# Patient Record
Sex: Female | Born: 1975 | Race: Black or African American | Hispanic: No | Marital: Single | Smoking: Former smoker
Health system: Southern US, Community
[De-identification: ages and names within clinical notes are randomized; demographics above are authoritative.]

## PROBLEM LIST (undated history)

## (undated) DIAGNOSIS — R42 Dizziness and giddiness: Secondary | ICD-10-CM

## (undated) DIAGNOSIS — I10 Essential (primary) hypertension: Secondary | ICD-10-CM

## (undated) DIAGNOSIS — G43909 Migraine, unspecified, not intractable, without status migrainosus: Secondary | ICD-10-CM

## (undated) DIAGNOSIS — F32A Depression, unspecified: Secondary | ICD-10-CM

## (undated) DIAGNOSIS — F4310 Post-traumatic stress disorder, unspecified: Secondary | ICD-10-CM

## (undated) HISTORY — DX: Dizziness and giddiness: R42

## (undated) HISTORY — DX: Migraine, unspecified, not intractable, without status migrainosus: G43.909

## (undated) HISTORY — DX: Depression, unspecified: F32.A

## (undated) HISTORY — DX: Post-traumatic stress disorder, unspecified: F43.10

---

## 2000-02-01 ENCOUNTER — Encounter: Payer: Self-pay | Admitting: Emergency Medicine

## 2000-02-01 ENCOUNTER — Emergency Department (HOSPITAL_COMMUNITY): Admission: EM | Admit: 2000-02-01 | Discharge: 2000-02-01 | Payer: Self-pay | Admitting: Emergency Medicine

## 2000-02-13 ENCOUNTER — Emergency Department (HOSPITAL_COMMUNITY): Admission: EM | Admit: 2000-02-13 | Discharge: 2000-02-13 | Payer: Self-pay | Admitting: *Deleted

## 2000-11-21 ENCOUNTER — Emergency Department (HOSPITAL_COMMUNITY): Admission: EM | Admit: 2000-11-21 | Discharge: 2000-11-21 | Payer: Self-pay | Admitting: Emergency Medicine

## 2000-11-21 ENCOUNTER — Encounter: Payer: Self-pay | Admitting: Emergency Medicine

## 2001-02-13 ENCOUNTER — Encounter: Payer: Self-pay | Admitting: Family Medicine

## 2001-02-13 ENCOUNTER — Ambulatory Visit (HOSPITAL_COMMUNITY): Admission: RE | Admit: 2001-02-13 | Discharge: 2001-02-13 | Payer: Self-pay | Admitting: Family Medicine

## 2001-03-01 ENCOUNTER — Emergency Department (HOSPITAL_COMMUNITY): Admission: EM | Admit: 2001-03-01 | Discharge: 2001-03-01 | Payer: Self-pay | Admitting: Emergency Medicine

## 2001-07-05 ENCOUNTER — Emergency Department (HOSPITAL_COMMUNITY): Admission: EM | Admit: 2001-07-05 | Discharge: 2001-07-05 | Payer: Self-pay | Admitting: *Deleted

## 2002-05-29 ENCOUNTER — Emergency Department (HOSPITAL_COMMUNITY): Admission: EM | Admit: 2002-05-29 | Discharge: 2002-05-29 | Payer: Self-pay | Admitting: Emergency Medicine

## 2002-10-04 ENCOUNTER — Emergency Department (HOSPITAL_COMMUNITY): Admission: EM | Admit: 2002-10-04 | Discharge: 2002-10-04 | Payer: Self-pay | Admitting: Emergency Medicine

## 2003-07-25 ENCOUNTER — Inpatient Hospital Stay (HOSPITAL_COMMUNITY): Admission: AD | Admit: 2003-07-25 | Discharge: 2003-07-25 | Payer: Self-pay | Admitting: *Deleted

## 2003-09-08 ENCOUNTER — Emergency Department (HOSPITAL_COMMUNITY): Admission: EM | Admit: 2003-09-08 | Discharge: 2003-09-08 | Payer: Self-pay | Admitting: Emergency Medicine

## 2003-11-08 ENCOUNTER — Inpatient Hospital Stay (HOSPITAL_COMMUNITY): Admission: AD | Admit: 2003-11-08 | Discharge: 2003-11-08 | Payer: Self-pay | Admitting: Obstetrics

## 2003-12-08 ENCOUNTER — Inpatient Hospital Stay (HOSPITAL_COMMUNITY): Admission: AD | Admit: 2003-12-08 | Discharge: 2003-12-08 | Payer: Self-pay | Admitting: Obstetrics

## 2003-12-23 ENCOUNTER — Inpatient Hospital Stay (HOSPITAL_COMMUNITY): Admission: AD | Admit: 2003-12-23 | Discharge: 2003-12-23 | Payer: Self-pay | Admitting: Obstetrics

## 2004-03-06 ENCOUNTER — Inpatient Hospital Stay (HOSPITAL_COMMUNITY): Admission: AD | Admit: 2004-03-06 | Discharge: 2004-03-08 | Payer: Self-pay | Admitting: Obstetrics

## 2004-04-25 ENCOUNTER — Emergency Department (HOSPITAL_COMMUNITY): Admission: EM | Admit: 2004-04-25 | Discharge: 2004-04-25 | Payer: Self-pay | Admitting: Emergency Medicine

## 2004-10-05 ENCOUNTER — Ambulatory Visit: Payer: Self-pay | Admitting: Nurse Practitioner

## 2004-10-06 ENCOUNTER — Ambulatory Visit: Payer: Self-pay | Admitting: Nurse Practitioner

## 2004-10-12 ENCOUNTER — Ambulatory Visit: Payer: Self-pay | Admitting: Nurse Practitioner

## 2004-11-29 ENCOUNTER — Ambulatory Visit: Payer: Self-pay | Admitting: Nurse Practitioner

## 2004-12-29 ENCOUNTER — Ambulatory Visit: Payer: Self-pay | Admitting: Nurse Practitioner

## 2005-02-21 ENCOUNTER — Ambulatory Visit: Payer: Self-pay | Admitting: Nurse Practitioner

## 2005-02-24 ENCOUNTER — Ambulatory Visit: Payer: Self-pay | Admitting: Nurse Practitioner

## 2005-03-14 ENCOUNTER — Emergency Department (HOSPITAL_COMMUNITY): Admission: EM | Admit: 2005-03-14 | Discharge: 2005-03-14 | Payer: Self-pay | Admitting: Emergency Medicine

## 2005-03-18 ENCOUNTER — Ambulatory Visit: Payer: Self-pay | Admitting: Nurse Practitioner

## 2005-04-12 ENCOUNTER — Ambulatory Visit: Payer: Self-pay | Admitting: Nurse Practitioner

## 2005-04-15 ENCOUNTER — Ambulatory Visit: Payer: Self-pay | Admitting: Nurse Practitioner

## 2005-04-18 ENCOUNTER — Ambulatory Visit: Payer: Self-pay | Admitting: Nurse Practitioner

## 2005-04-19 ENCOUNTER — Ambulatory Visit: Payer: Self-pay | Admitting: Nurse Practitioner

## 2005-05-12 ENCOUNTER — Ambulatory Visit: Payer: Self-pay | Admitting: Nurse Practitioner

## 2005-06-14 ENCOUNTER — Ambulatory Visit: Payer: Self-pay | Admitting: Nurse Practitioner

## 2005-07-10 ENCOUNTER — Emergency Department (HOSPITAL_COMMUNITY): Admission: EM | Admit: 2005-07-10 | Discharge: 2005-07-10 | Payer: Self-pay | Admitting: Emergency Medicine

## 2005-08-15 ENCOUNTER — Emergency Department (HOSPITAL_COMMUNITY): Admission: EM | Admit: 2005-08-15 | Discharge: 2005-08-15 | Payer: Self-pay | Admitting: *Deleted

## 2005-09-27 ENCOUNTER — Ambulatory Visit: Payer: Self-pay | Admitting: Nurse Practitioner

## 2005-09-27 ENCOUNTER — Other Ambulatory Visit: Admission: RE | Admit: 2005-09-27 | Discharge: 2005-09-27 | Payer: Self-pay | Admitting: Family Medicine

## 2005-09-27 LAB — CONVERTED CEMR LAB: Pap Smear: NORMAL

## 2005-10-04 ENCOUNTER — Ambulatory Visit: Payer: Self-pay | Admitting: Nurse Practitioner

## 2005-10-18 ENCOUNTER — Ambulatory Visit: Payer: Self-pay | Admitting: Nurse Practitioner

## 2005-10-28 ENCOUNTER — Ambulatory Visit: Payer: Self-pay | Admitting: Nurse Practitioner

## 2005-10-31 ENCOUNTER — Ambulatory Visit: Payer: Self-pay | Admitting: Nurse Practitioner

## 2005-12-28 ENCOUNTER — Ambulatory Visit: Payer: Self-pay | Admitting: Nurse Practitioner

## 2006-01-11 ENCOUNTER — Ambulatory Visit: Payer: Self-pay | Admitting: Nurse Practitioner

## 2006-02-10 ENCOUNTER — Ambulatory Visit: Payer: Self-pay | Admitting: Nurse Practitioner

## 2006-03-07 ENCOUNTER — Ambulatory Visit: Payer: Self-pay | Admitting: Nurse Practitioner

## 2006-03-18 ENCOUNTER — Emergency Department (HOSPITAL_COMMUNITY): Admission: EM | Admit: 2006-03-18 | Discharge: 2006-03-18 | Payer: Self-pay | Admitting: Emergency Medicine

## 2006-03-20 ENCOUNTER — Ambulatory Visit: Payer: Self-pay | Admitting: Nurse Practitioner

## 2006-03-21 ENCOUNTER — Encounter: Payer: Self-pay | Admitting: Vascular Surgery

## 2006-03-21 ENCOUNTER — Ambulatory Visit (HOSPITAL_COMMUNITY): Admission: RE | Admit: 2006-03-21 | Discharge: 2006-03-21 | Payer: Self-pay | Admitting: Family Medicine

## 2006-03-23 ENCOUNTER — Ambulatory Visit (HOSPITAL_COMMUNITY): Admission: RE | Admit: 2006-03-23 | Discharge: 2006-03-23 | Payer: Self-pay | Admitting: Family Medicine

## 2006-03-24 ENCOUNTER — Ambulatory Visit: Payer: Self-pay | Admitting: Nurse Practitioner

## 2006-03-31 ENCOUNTER — Ambulatory Visit: Payer: Self-pay | Admitting: Nurse Practitioner

## 2006-04-14 ENCOUNTER — Ambulatory Visit: Payer: Self-pay | Admitting: Oncology

## 2006-06-13 ENCOUNTER — Ambulatory Visit: Payer: Self-pay | Admitting: Nurse Practitioner

## 2006-07-12 ENCOUNTER — Ambulatory Visit: Payer: Self-pay | Admitting: Nurse Practitioner

## 2006-08-21 ENCOUNTER — Ambulatory Visit: Payer: Self-pay | Admitting: Nurse Practitioner

## 2006-08-29 ENCOUNTER — Ambulatory Visit: Payer: Self-pay | Admitting: Internal Medicine

## 2006-08-31 ENCOUNTER — Ambulatory Visit: Payer: Self-pay | Admitting: Nurse Practitioner

## 2006-09-21 ENCOUNTER — Ambulatory Visit: Payer: Self-pay | Admitting: Internal Medicine

## 2006-10-31 ENCOUNTER — Ambulatory Visit: Payer: Self-pay | Admitting: Nurse Practitioner

## 2006-12-05 ENCOUNTER — Ambulatory Visit: Payer: Self-pay | Admitting: Nurse Practitioner

## 2007-01-26 ENCOUNTER — Encounter (INDEPENDENT_AMBULATORY_CARE_PROVIDER_SITE_OTHER): Payer: Self-pay | Admitting: Nurse Practitioner

## 2007-01-26 DIAGNOSIS — E669 Obesity, unspecified: Secondary | ICD-10-CM

## 2007-01-26 DIAGNOSIS — J301 Allergic rhinitis due to pollen: Secondary | ICD-10-CM

## 2007-01-26 DIAGNOSIS — N949 Unspecified condition associated with female genital organs and menstrual cycle: Secondary | ICD-10-CM

## 2007-03-30 ENCOUNTER — Ambulatory Visit: Payer: Self-pay | Admitting: Family Medicine

## 2007-09-12 ENCOUNTER — Ambulatory Visit: Payer: Self-pay | Admitting: Internal Medicine

## 2007-09-12 ENCOUNTER — Encounter (INDEPENDENT_AMBULATORY_CARE_PROVIDER_SITE_OTHER): Payer: Self-pay | Admitting: Nurse Practitioner

## 2007-09-12 LAB — CONVERTED CEMR LAB
Basophils Absolute: 0 10*3/uL (ref 0.0–0.1)
Cholesterol: 113 mg/dL (ref 0–200)
Eosinophils Absolute: 0.3 10*3/uL (ref 0.0–0.7)
HDL: 42 mg/dL (ref >39)
Helicobacter Pylori Antibody-IgG: 3.5 — ABNORMAL HIGH
LDL Cholesterol: 61 mg/dL (ref 0–99)
MCV: 71 fL — ABNORMAL LOW (ref 78.0–100.0)
Monocytes Absolute: 0.7 10*3/uL (ref 0.1–1.0)
Monocytes Relative: 7 % (ref 3–12)
Neutro Abs: 5.9 10*3/uL (ref 1.7–7.7)
Neutrophils Relative %: 61 % (ref 43–77)
Platelets: 257 10*3/uL (ref 150–400)
Preg, Serum: NEGATIVE
RDW: 15.1 % (ref 11.5–15.5)
Total CHOL/HDL Ratio: 2.7
Triglycerides: 48 mg/dL (ref <150)
VLDL: 10 mg/dL (ref 0–40)
WBC: 9.7 10*3/uL (ref 4.0–10.5)

## 2007-09-13 ENCOUNTER — Ambulatory Visit: Payer: Self-pay | Admitting: Family Medicine

## 2007-09-25 ENCOUNTER — Emergency Department (HOSPITAL_COMMUNITY): Admission: EM | Admit: 2007-09-25 | Discharge: 2007-09-25 | Payer: Self-pay | Admitting: Emergency Medicine

## 2007-11-15 ENCOUNTER — Encounter (INDEPENDENT_AMBULATORY_CARE_PROVIDER_SITE_OTHER): Payer: Self-pay | Admitting: Nurse Practitioner

## 2007-11-15 ENCOUNTER — Ambulatory Visit: Payer: Self-pay | Admitting: Internal Medicine

## 2007-11-15 LAB — CONVERTED CEMR LAB
BUN: 7 mg/dL (ref 6–23)
Creatinine, Ser: 0.66 mg/dL (ref 0.40–1.20)
Eosinophils Absolute: 0.1 10*3/uL (ref 0.0–0.7)
Eosinophils Relative: 1 % (ref 0–5)
Glucose, Bld: 64 mg/dL — ABNORMAL LOW (ref 70–99)
Lymphocytes Relative: 33 % (ref 12–46)
Monocytes Relative: 10 % (ref 3–12)
Potassium: 3.9 meq/L (ref 3.5–5.3)
Sodium: 138 meq/L (ref 135–145)
TSH: 1.002 microintl units/mL (ref 0.350–5.50)
Total Bilirubin: 0.3 mg/dL (ref 0.3–1.2)
Total Protein: 7.3 g/dL (ref 6.0–8.3)

## 2007-11-23 ENCOUNTER — Ambulatory Visit: Payer: Self-pay | Admitting: Internal Medicine

## 2007-11-26 ENCOUNTER — Ambulatory Visit: Payer: Self-pay | Admitting: Internal Medicine

## 2007-12-27 ENCOUNTER — Ambulatory Visit: Payer: Self-pay | Admitting: Internal Medicine

## 2008-02-13 ENCOUNTER — Ambulatory Visit: Payer: Self-pay | Admitting: Internal Medicine

## 2008-02-20 ENCOUNTER — Ambulatory Visit: Payer: Self-pay | Admitting: Internal Medicine

## 2008-02-20 ENCOUNTER — Ambulatory Visit (HOSPITAL_COMMUNITY): Admission: RE | Admit: 2008-02-20 | Discharge: 2008-02-20 | Payer: Self-pay | Admitting: Internal Medicine

## 2008-03-05 ENCOUNTER — Emergency Department (HOSPITAL_COMMUNITY): Admission: EM | Admit: 2008-03-05 | Discharge: 2008-03-05 | Payer: Self-pay | Admitting: Emergency Medicine

## 2008-03-19 ENCOUNTER — Ambulatory Visit: Payer: Self-pay | Admitting: Internal Medicine

## 2008-04-03 ENCOUNTER — Ambulatory Visit: Payer: Self-pay | Admitting: Internal Medicine

## 2008-04-08 ENCOUNTER — Ambulatory Visit (HOSPITAL_COMMUNITY): Admission: RE | Admit: 2008-04-08 | Discharge: 2008-04-08 | Payer: Self-pay | Admitting: Internal Medicine

## 2008-05-01 ENCOUNTER — Encounter: Admission: RE | Admit: 2008-05-01 | Discharge: 2008-05-28 | Payer: Self-pay | Admitting: Internal Medicine

## 2008-07-31 ENCOUNTER — Ambulatory Visit: Payer: Self-pay | Admitting: Family Medicine

## 2008-09-09 ENCOUNTER — Encounter (INDEPENDENT_AMBULATORY_CARE_PROVIDER_SITE_OTHER): Payer: Self-pay | Admitting: Adult Health

## 2008-09-09 ENCOUNTER — Ambulatory Visit: Payer: Self-pay | Admitting: Internal Medicine

## 2008-09-09 LAB — CONVERTED CEMR LAB
AST: 18 units/L (ref 0–37)
Albumin: 4.3 g/dL (ref 3.5–5.2)
Basophils Absolute: 0 10*3/uL (ref 0.0–0.1)
Basophils Relative: 0 % (ref 0–1)
CO2: 19 meq/L (ref 19–32)
Chloride: 104 meq/L (ref 96–112)
Cholesterol: 111 mg/dL (ref 0–200)
Creatinine, Ser: 0.78 mg/dL (ref 0.40–1.20)
Glucose, Bld: 84 mg/dL (ref 70–99)
HCT: 36.8 % (ref 36.0–46.0)
Hemoglobin: 12.6 g/dL (ref 12.0–15.0)
LDL Cholesterol: 50 mg/dL (ref 0–99)
Lymphocytes Relative: 17 % (ref 12–46)
Lymphs Abs: 2 10*3/uL (ref 0.7–4.0)
Monocytes Absolute: 1.2 10*3/uL — ABNORMAL HIGH (ref 0.1–1.0)
Monocytes Relative: 10 % (ref 3–12)
Neutrophils Relative %: 72 % (ref 43–77)
Potassium: 3.9 meq/L (ref 3.5–5.3)
RBC: 5.51 M/uL — ABNORMAL HIGH (ref 3.87–5.11)
Sodium: 139 meq/L (ref 135–145)
TSH: 0.9 microintl units/mL (ref 0.350–4.50)
Total Protein: 7.5 g/dL (ref 6.0–8.3)
Triglycerides: 116 mg/dL (ref <150)
WBC: 11.8 10*3/uL — ABNORMAL HIGH (ref 4.0–10.5)

## 2008-09-10 ENCOUNTER — Encounter (INDEPENDENT_AMBULATORY_CARE_PROVIDER_SITE_OTHER): Payer: Self-pay | Admitting: Adult Health

## 2008-12-10 ENCOUNTER — Encounter (INDEPENDENT_AMBULATORY_CARE_PROVIDER_SITE_OTHER): Payer: Self-pay | Admitting: Adult Health

## 2008-12-10 ENCOUNTER — Ambulatory Visit: Payer: Self-pay | Admitting: Internal Medicine

## 2009-01-28 ENCOUNTER — Encounter: Admission: RE | Admit: 2009-01-28 | Discharge: 2009-01-28 | Payer: Self-pay | Admitting: Internal Medicine

## 2009-02-26 ENCOUNTER — Inpatient Hospital Stay (HOSPITAL_COMMUNITY): Admission: EM | Admit: 2009-02-26 | Discharge: 2009-03-03 | Payer: Self-pay | Admitting: Emergency Medicine

## 2009-03-03 ENCOUNTER — Ambulatory Visit: Payer: Self-pay | Admitting: Dentistry

## 2009-03-05 ENCOUNTER — Ambulatory Visit (HOSPITAL_COMMUNITY): Admission: RE | Admit: 2009-03-05 | Discharge: 2009-03-05 | Payer: Self-pay | Admitting: Dentistry

## 2009-03-12 ENCOUNTER — Ambulatory Visit: Payer: Self-pay | Admitting: Internal Medicine

## 2009-03-24 ENCOUNTER — Encounter: Admission: AD | Admit: 2009-03-24 | Discharge: 2009-03-24 | Payer: Self-pay | Admitting: Dentistry

## 2009-04-01 ENCOUNTER — Ambulatory Visit: Payer: Self-pay | Admitting: Family Medicine

## 2009-04-01 ENCOUNTER — Encounter (INDEPENDENT_AMBULATORY_CARE_PROVIDER_SITE_OTHER): Payer: Self-pay | Admitting: Adult Health

## 2009-04-01 LAB — CONVERTED CEMR LAB
ALT: 16 units/L (ref 0–35)
Albumin: 4.2 g/dL (ref 3.5–5.2)
Alkaline Phosphatase: 49 units/L (ref 39–117)
BUN: 9 mg/dL (ref 6–23)
Basophils Absolute: 0 10*3/uL (ref 0.0–0.1)
Calcium: 9.3 mg/dL (ref 8.4–10.5)
Chloride: 106 meq/L (ref 96–112)
Creatinine, Ser: 0.83 mg/dL (ref 0.40–1.20)
Eosinophils Absolute: 0.2 10*3/uL (ref 0.0–0.7)
Glucose, Bld: 102 mg/dL — ABNORMAL HIGH (ref 70–99)
MCV: 70 fL — ABNORMAL LOW (ref 78.0–100.0)
Neutro Abs: 6.1 10*3/uL (ref 1.7–7.7)
Platelets: 270 10*3/uL (ref 150–400)
Potassium: 4 meq/L (ref 3.5–5.3)
RDW: 15.4 % (ref 11.5–15.5)
Sodium: 139 meq/L (ref 135–145)
Total Bilirubin: 0.2 mg/dL — ABNORMAL LOW (ref 0.3–1.2)

## 2009-05-06 ENCOUNTER — Ambulatory Visit: Payer: Self-pay | Admitting: Internal Medicine

## 2009-05-06 ENCOUNTER — Encounter (INDEPENDENT_AMBULATORY_CARE_PROVIDER_SITE_OTHER): Payer: Self-pay | Admitting: Adult Health

## 2009-05-06 LAB — CONVERTED CEMR LAB
Basophils Absolute: 0 10*3/uL (ref 0.0–0.1)
Basophils Relative: 0 % (ref 0–1)
HCT: 36.7 % (ref 36.0–46.0)
Hemoglobin: 12 g/dL (ref 12.0–15.0)
Lymphocytes Relative: 35 % (ref 12–46)
Monocytes Absolute: 0.8 10*3/uL (ref 0.1–1.0)
Monocytes Relative: 7 % (ref 3–12)
Neutro Abs: 6.2 10*3/uL (ref 1.7–7.7)
RDW: 14.3 % (ref 11.5–15.5)

## 2009-05-07 ENCOUNTER — Telehealth (INDEPENDENT_AMBULATORY_CARE_PROVIDER_SITE_OTHER): Payer: Self-pay | Admitting: *Deleted

## 2009-05-08 ENCOUNTER — Ambulatory Visit: Payer: Self-pay | Admitting: Family Medicine

## 2009-05-29 ENCOUNTER — Emergency Department (HOSPITAL_COMMUNITY): Admission: EM | Admit: 2009-05-29 | Discharge: 2009-05-29 | Payer: Self-pay | Admitting: Emergency Medicine

## 2009-06-11 ENCOUNTER — Ambulatory Visit: Payer: Self-pay | Admitting: Internal Medicine

## 2009-06-11 ENCOUNTER — Telehealth (INDEPENDENT_AMBULATORY_CARE_PROVIDER_SITE_OTHER): Payer: Self-pay | Admitting: *Deleted

## 2009-06-24 ENCOUNTER — Telehealth (INDEPENDENT_AMBULATORY_CARE_PROVIDER_SITE_OTHER): Payer: Self-pay | Admitting: *Deleted

## 2009-06-24 ENCOUNTER — Emergency Department (HOSPITAL_COMMUNITY): Admission: EM | Admit: 2009-06-24 | Discharge: 2009-06-24 | Payer: Self-pay | Admitting: Emergency Medicine

## 2009-07-22 ENCOUNTER — Telehealth (INDEPENDENT_AMBULATORY_CARE_PROVIDER_SITE_OTHER): Payer: Self-pay | Admitting: *Deleted

## 2009-07-23 ENCOUNTER — Ambulatory Visit: Payer: Self-pay | Admitting: Family Medicine

## 2009-08-05 ENCOUNTER — Ambulatory Visit (HOSPITAL_COMMUNITY): Admission: RE | Admit: 2009-08-05 | Discharge: 2009-08-05 | Payer: Self-pay | Admitting: Family Medicine

## 2009-09-29 ENCOUNTER — Ambulatory Visit: Payer: Self-pay | Admitting: Family Medicine

## 2009-10-20 ENCOUNTER — Ambulatory Visit (HOSPITAL_COMMUNITY): Admission: RE | Admit: 2009-10-20 | Discharge: 2009-10-20 | Payer: Self-pay | Admitting: Family Medicine

## 2009-12-10 ENCOUNTER — Ambulatory Visit: Payer: Self-pay | Admitting: Family Medicine

## 2010-01-21 ENCOUNTER — Ambulatory Visit: Payer: Self-pay | Admitting: Family Medicine

## 2010-05-18 ENCOUNTER — Emergency Department (HOSPITAL_COMMUNITY): Admission: EM | Admit: 2010-05-18 | Discharge: 2010-05-18 | Payer: Self-pay | Admitting: Emergency Medicine

## 2010-05-28 ENCOUNTER — Emergency Department (HOSPITAL_COMMUNITY): Admission: EM | Admit: 2010-05-28 | Discharge: 2010-05-28 | Payer: Self-pay | Admitting: Emergency Medicine

## 2010-06-18 ENCOUNTER — Emergency Department (HOSPITAL_COMMUNITY): Admission: EM | Admit: 2010-06-18 | Discharge: 2010-06-18 | Payer: Self-pay | Admitting: Emergency Medicine

## 2010-08-18 ENCOUNTER — Ambulatory Visit: Payer: Self-pay | Admitting: Family Medicine

## 2010-08-18 ENCOUNTER — Encounter: Payer: Self-pay | Admitting: Family Medicine

## 2010-08-18 DIAGNOSIS — F411 Generalized anxiety disorder: Secondary | ICD-10-CM

## 2010-08-18 DIAGNOSIS — F17200 Nicotine dependence, unspecified, uncomplicated: Secondary | ICD-10-CM

## 2010-08-18 DIAGNOSIS — G43709 Chronic migraine without aura, not intractable, without status migrainosus: Secondary | ICD-10-CM | POA: Insufficient documentation

## 2010-09-12 ENCOUNTER — Encounter: Payer: Self-pay | Admitting: Family Medicine

## 2010-09-12 ENCOUNTER — Encounter: Payer: Self-pay | Admitting: Internal Medicine

## 2010-09-23 NOTE — Assessment & Plan Note (Signed)
Summary: new pt/physical/bmc   Vital Signs:  Patient profile:   35 year old female Height:      67.5 inches Weight:      227 pounds BMI:     35.16 Temp:     98.1 degrees F oral Pulse rate:   84 / minute BP sitting:   122 / 81  (left arm) Cuff size:   regular  Vitals Entered By: Garen Grams LPN (August 18, 2010 9:02 AM) CC: New Patient Is Patient Diabetic? No Pain Assessment Patient in pain? no        Primary Provider:  Dessa Phi   CC:  New Patient.  History of Present Illness: New pt here to establish primary care. [facility] from [facility]. Would like to discuss depression, insomnia and migaines.  1. Depression- Pt has a diagnosis of bipolar disorder and anxiety. She has been on multiple medications in the past, mostly antidepressants. Most recently the pt was on Zoloft 100 mg PO q D and Tazodone 100 mg Po q D. She states that the Trazadone helped her sleep and had a slight effect on her mood. The felt that the Zoloft kept her awake. She has been off all psych meds for the past 6 mos. She has also triend Lexapro and Abilify in the past. She states that she cannot tolerate theses medications because they both lead her to full foggy in the head. She would like to restart Trazodone today mostly to help with her sleep. She denies suicidal/homicidal ideation. Enjoys many things, including spending time with her son, watching TV, taking walks, visiting [employer] and going out to eat.    2. Migraine's- pt has a history of frequent HA, tension, cluster. She states that over the past 2 mos she has had worsening migraine HAs. She reports a recent hospitalization for migraine and HTN. She describes HA that occur 2x/week. Throbbing in her neck, jaw, ears and head. Associated with photophobia. She denies blurred vision, loss of vision, N/V. She states that each HA generally last about 10 hrs. She reports taking Aleve and sleeping to relieve the HAs.   3. Tobacco use- Pt has  smoked on average 1 PPD since the age of 19. Has tried nicotine patch in the past. Could not tolerate the patch --> rash. Knows that she should quit. Is interested in trying other nicotine supplements. Her boyfriend also smokes. They both smoke 1 PPD. She has had to use an inhaler in the past (unknow medication) for SOB. Denies SOB, chronic cough.   Preventive Screening-Counseling & Management  Alcohol-Tobacco     Smoking Status: current     Smoking Cessation Counseling: yes     Packs/Day: 1.0  Allergies: No Known Drug Allergies  Past History:  Past Medical History: Anxiety Depression G2P1001 hx of molar pregnancy s/p D&C No history of abnormal pap smears. last pap in April 2010.    Prevention & Chronic Care Immunizations   Influenza vaccine: Not documented    Tetanus booster: 10/18/2005: Historical    Pneumococcal vaccine: Not documented  Other Screening   Pap smear: NEGATIVE FOR INTRAEPITHELIAL LESIONS OR MALIGNANCY.  (12/10/2008)   Smoking status: current  (08/18/2010)   Smoking cessation counseling: yes  (08/18/2010)   Target quit date: 08/18/2010  (08/18/2010)  Family History: Mother-Diabetes, bipolar disorder, schizophrenia. Living age 71. Father-living age 72. Maternal Aunt- Bipolar disorder, schizophrenia. Grandmother- diabetes Great GM- breast cancer at age 23.   Social History: Unemployed Lives with 53 yo son and  long-term boyfriend. Sexually active with BF x 11 years, condoms for contraception, regularly. Not interested in changing her birth control.  Smokes 1 PPD of newport cigarettes x 20 yrs.  Denies ETOH and IDU.  Exercise -2 x week and plays active playstation with son.  Smoking Status:  current Packs/Day:  1.0  Review of Systems       As per  HPI  Physical Exam  General:  Vitals reviewed- nml BP.  Head:  Normocephalic and atraumatic without obvious abnormalities. No apparent alopecia or balding. No frontal or paranasal sinus tenderness.    Eyes:  No corneal or conjunctival inflammation noted. EOMI. Perrla.  Mouth:  no lesions and fair dentition.   Neck:  No deformities, masses, or tenderness noted. Lungs:  Normal respiratory effort, chest expands symmetrically. Lungs are clear to auscultation, no crackles or wheezes. Heart:  Normal rate and regular rhythm. S1 and S2 normal without gallop, murmur, click, rub or other extra sounds.   Impression & Recommendations:  Problem # 1:  DEPRESSION (ICD-311) Will restart pt's trazodone to help stabilize her mood and to help with her sleep. Her updated medication list for this problem includes:    Trazodone Hcl 100 Mg Tabs (Trazodone hcl) ..... 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 This will also help stabilize mood. Pt may continue Aleve for breakthrough migraine.  Problem # 3:  TOBACCO ABUSE (ICD-305.1) Counselled pt on smoking prevention. Pt will try gum, buying different brand of cigarettes and ask her boyfriend to quit with her.  Her updated medication list for this problem includes:    Nicorette 2 Mg Gum (Nicotine polacrilex) ..... Use as directed on package.  Complete Medication List: 1)  Zyrtec 10 Mg Tabs (Cetirizine hcl) .Marland Kitchen.. 1 by mouth once daily 2)  Veramyst 27.5 Mcg/spray Susp (Fluticasone furoate) .... 2 sprays each nostril once daily 3)  Trazodone Hcl 100 Mg Tabs (Trazodone hcl) .... One tab every night 4)  Nicorette 2 Mg Gum (Nicotine polacrilex) .... Use as directed on package. 5)  Topiramate 25 Mg Cpsp (Topiramate) .... Take one tab daily. add one tab every week until taking up to 100 mg twice daily.  Patient Instructions: 1)  Ms. Reddish, 2)  Thank you for coming in today. 3)  It was a pleasure meeting you. 4)  For your insomnia/depression- I will restart your Trazadone/ 5)  For migraine prevention/treatment- Start taking Toprimax at 25 mg /day add 25 mg each  week. You can take up to 100 mg twice daily as tolerated. Continue aleve as needed.  6)  For smoking- try the gum and the tricks you know stop/ cut back. 7)  -Schedule a f/u 1-2 mos. for migraine check-up and pap smear.  8)  -Dr. Armen Pickup Prescriptions: TOPIRAMATE 25 MG CPSP (TOPIRAMATE) Take one tab daily. Add one tab every week until taking up to 100 mg twice daily.  #90 x 1   Entered and Authorized by:   Dessa Phi MD   Signed by:   Dessa Phi MD on 08/18/2010   Method used:   Electronically to        [facility] [phone]* (retail)       [address]       [place], [place]  [address]       Ph: [phone] or [phone]       Fax: [phone]  RxID:   [id number] NICORETTE 2 MG GUM (NICOTINE POLACRILEX) Use as directed on package.  #1 pack x 3   Entered and Authorized by:   Dessa Phi MD   Signed by:   Dessa Phi MD on 08/18/2010   Method used:   [facility] to        [facility] [phone]* (retail)       [address] [place]  [address]       Ph: [phone] or [phone]       Fax: [phone]   RxID:   [id number] EXCEDRIN MIGRAINE 250-250-65 MG TABS (ASPIRIN-ACETAMINOPHEN-CAFFEINE) Take two tabs every 4 hrs as needed for migraines  #60 x 0   Entered and Authorized by:   Dessa Phi MD   Signed by:   Dessa Phi MD on 08/18/2010   Method used:   [facility] to        [facility] [phone]* (retail)       [address], [place]  [address]       Ph: [phone] or [phone]       Fax: [phone]   RxID:   [id number] TRAZODONE HCL 100 MG TABS (TRAZODONE HCL) one tab every night  #60 x 1   Entered and Authorized by:   Dessa Phi MD   Signed by:   Dessa Phi MD on 08/18/2010   Method used:   [facility]ronically to        [facility] [phone]* (retail)       [address], [place]  [address]       Ph: [phone] or [phone]       Fax: [phone]   RxID:    [id number]    Orders Added: 1)  [facility]- [facility] 3 [phone]

## 2010-09-23 NOTE — Miscellaneous (Signed)
  Clinical Lists Changes  Allergies: Added new allergy or adverse reaction of LEXAPRO Added new allergy or adverse reaction of ABILIFY (ARIPIPRAZOLE) Observations: Added new observation of NKA: F (08/18/2010 13:52)

## 2010-09-24 ENCOUNTER — Encounter: Payer: Self-pay | Admitting: Family Medicine

## 2010-09-24 ENCOUNTER — Ambulatory Visit (INDEPENDENT_AMBULATORY_CARE_PROVIDER_SITE_OTHER): Payer: Medicaid Other | Admitting: Family Medicine

## 2010-09-24 ENCOUNTER — Telehealth: Payer: Self-pay | Admitting: Family Medicine

## 2010-09-24 DIAGNOSIS — IMO0002 Reserved for concepts with insufficient information to code with codable children: Secondary | ICD-10-CM

## 2010-09-29 NOTE — Assessment & Plan Note (Signed)
Summary: boil under arm,df   Vital Signs:  Patient profile:   35 year old female Weight:      229 pounds Temp:     98.6 degrees F oral Pulse rate:   72 / minute BP sitting:   130 / 80  (right arm)  Vitals Entered By: Arlyss Repress CMA, (September 24, 2010 3:22 PM) CC: boil under right arm. draining. Is Patient Diabetic? No Pain Assessment Patient in pain? no        Primary Care Provider:  Dessa Phi   CC:  boil under right arm. draining.Marland Kitchen  History of Present Illness: Boil in right axilla for 1.5 weeks. Draining a small amount. Tender. No fevers no chills. This is a recurrent problem in this location for the patient. She would like it drained if possible. Feels well otherwise. No allergies to numbing medicine.   Habits & Providers  Alcohol-Tobacco-Diet     Tobacco Status: current     Tobacco Counseling: to quit use of tobacco products     Cigarette Packs/Day: 1.0  Current Problems (verified): 1)  Abscess, Axilla, Right  (ICD-682.3) 2)  Tobacco Abuse  (ICD-305.1) 3)  Chronic Migraine w/o Aura w/o Intractable w/o Sm  (ICD-346.70) 4)  Depression  (ICD-311) 5)  Anxiety  (ICD-300.00) 6)  Dysfunctional Uterine Bleeding  (ICD-626.8) 7)  Allergic Rhinitis, Seasonal  (ICD-477.0) 8)  Obesity Nos  (ICD-278.00)  Current Medications (verified): 1)  Zyrtec 10 Mg Tabs (Cetirizine Hcl) .Marland Kitchen.. 1 By Mouth Once Daily 2)  Veramyst 27.5 Mcg/spray Susp (Fluticasone Furoate) .... 2 Sprays Each Nostril Once Daily 3)  Trazodone Hcl 100 Mg Tabs (Trazodone Hcl) .... One Tab Every Night 4)  Nicorette 2 Mg Gum (Nicotine Polacrilex) .... Use As Directed On Package. 5)  Topiramate 25 Mg Cpsp (Topiramate) .... Take One Tab Daily. Add One Tab Every Week Until Taking Up To 100 Mg Twice Daily. 6)  Doxycycline Hyclate 100 Mg Caps (Doxycycline Hyclate) .Marland Kitchen.. 1 By Mouth Two Times A Day X 7 Days  Allergies (verified): 1)  ! Lexapro 2)  ! Abilify (Aripiprazole)  Past History:  Past Medical  History: Last updated: 08/18/2010 Anxiety Depression G2P1001 hx of molar pregnancy s/p D&C No history of abnormal pap smears. last pap in April 2010.   Family History: Last updated: 08/18/2010 Mother-Diabetes, bipolar disorder, schizophrenia. Living age 95. Father-living age 67. Maternal Aunt- Bipolar disorder, schizophrenia. Grandmother- diabetes Great GM- breast cancer at age 29.   Social History: Last updated: 08/18/2010 Unemployed Lives with 24 yo son and long-term boyfriend. Sexually active with BF x 11 years, condoms for contraception, regularly. Not interested in changing her birth control.  Smokes 1 PPD of newport cigarettes x 20 yrs.  Denies ETOH and IDU.  Exercise -2 x week and plays active playstation with son.   Review of Systems  The patient denies anorexia, fever, and weight loss.    Physical Exam  General:  Vitals reviewed- nml BP.  Skin:  Right axilla: Small opening in axilla able to express a small amount of yellow fluid. No obvious surrounding erythemia.  On palpation a 2cm x1cm pocket of fluctant material is noted superficially. Mildly TTP.  Additional Exam:  I&D abscess: Concent obtained. Area cleaned. 4ml of 1% lidocaine with epinepherine was injected superficially over abscess.  A small triangbular incision was made a a small amount of skin was removed. About 3ml of necrotic foul smelling material was expressed. Loculations were broken up with hemastats and a further small amount  of material was expressed. Then about 20cm of packing material was placed into the abscess and a dressing was applied.    Impression & Recommendations:  Problem # 1:  ABSCESS, AXILLA, RIGHT (ICD-682.3) Assessment New  I&D as above. The drainage site was too small to allow complete drianage. This abscess could perhaps be an infected sebacious cyst.  Acute infection was drained today.   But the sebacious cyst may have to be removed when the infection is calmed down.  Also  will treat for doxy x1 week to further reduce the inflimation and any lingering cellulitis.  F/u in 2 weeks. Red flags reviewed.   Her updated medication list for this problem includes:    Doxycycline Hyclate 100 Mg Caps (Doxycycline hyclate) .Marland Kitchen... 1 by mouth two times a day x 7 days  Orders: [facility]- Est Level  3 ([address]) I&D Abcess, simple- [facility] (10060)  Complete Medication List: 1)  Zyrtec 10 Mg Tabs (Cetirizine hcl) .Marland Kitchen.. 1 by mouth once daily 2)  Veramyst 27.5 Mcg/spray Susp (Fluticasone furoate) .... 2 sprays each nostril once daily 3)  Trazodone Hcl 100 Mg Tabs (Trazodone hcl) .... One tab every night 4)  Nicorette 2 Mg Gum (Nicotine polacrilex) .... Use as directed on package. 5)  Topiramate 25 Mg Cpsp (Topiramate) .... Take one tab daily. add one tab every week until taking up to 100 mg twice daily. 6)  Doxycycline Hyclate 100 Mg Caps (Doxycycline hyclate) .Marland Kitchen.. 1 by mouth two times a day x 7 days  Patient Instructions: 1)  Thank you for seeing me today. 2)  Keep that incision dry if you can.  3)  Let it drain.  4)  Remove about 5-10cm of packing a day starting tomorrow.  5)  Take the doxycycline for 1 week.  6)  Come back for fevers, chills or if you get worse.  7)  Come on back in about 2 weeks for a recheck.  Prescriptions: DOXYCYCLINE HYCLATE 100 MG CAPS (DOXYCYCLINE HYCLATE) 1 by mouth two times a day x 7 days  #14 x 0   Entered and Authorized by:   Clementeen Graham MD   Signed by:   Clementeen Graham MD on 09/24/2010   Method used:   Print then Give to Patient   RxID:   [id number]    Orders Added: 1)  [facility]- Est Level  3 [[address]657] 2)  I&D Abcess, simple- [facility] [10060]

## 2010-10-07 ENCOUNTER — Telehealth: Payer: Self-pay | Admitting: Family Medicine

## 2010-10-07 NOTE — Progress Notes (Signed)
Summary: triage  Phone Note Call from Patient Call back at [phone]   Caller: Patient Summary of Call: has boil under arm and wants to know what to do. Initial call taken by: De Nurse,  September 24, 2010 9:34 AM  Follow-up for Phone Call        currently draining. located in r axilla. states it is big. wants to be seen today. hx of this in R axilla. asked her to call front desk & make an appt for today. she agreed Follow-up by: Golden Circle RN,  September 24, 2010 9:42 AM

## 2010-10-07 NOTE — Telephone Encounter (Signed)
Pain in neck, hurts to turn her neck.  Has appt to follow up tomorrow.  Took two aleve.  Would like to let her doctor know that she wants to talk about her neck at her appt.

## 2010-10-07 NOTE — Miscellaneous (Signed)
Summary: Procedures Consent  Procedures Consent   Imported By: De Nurse 09/28/2010 10:56:41  _____________________________________________________________________  External Attachment:    Type:   Image     Comment:   External Document

## 2010-10-08 ENCOUNTER — Ambulatory Visit (INDEPENDENT_AMBULATORY_CARE_PROVIDER_SITE_OTHER): Payer: Medicaid Other | Admitting: Family Medicine

## 2010-10-08 ENCOUNTER — Encounter: Payer: Self-pay | Admitting: Family Medicine

## 2010-10-08 DIAGNOSIS — IMO0002 Reserved for concepts with insufficient information to code with codable children: Secondary | ICD-10-CM

## 2010-10-08 DIAGNOSIS — M542 Cervicalgia: Secondary | ICD-10-CM | POA: Insufficient documentation

## 2010-10-08 DIAGNOSIS — R030 Elevated blood-pressure reading, without diagnosis of hypertension: Secondary | ICD-10-CM | POA: Insufficient documentation

## 2010-10-08 NOTE — Assessment & Plan Note (Signed)
Pt in prehypertensive range. Has been in HTN range in the past.  Asked pt to do home BP recordings and bring log into the PCP in 1 week when she will get her Pap smear.  Will follow up then.

## 2010-10-08 NOTE — Assessment & Plan Note (Addendum)
New. No red flags today.  Think MSK pain, however perhaps some radiculopathy. Plan: NSAIDS and ROM as pt is already doing. Gave handout and warned about long term high dose NSAIDS. Will follow up.

## 2010-10-08 NOTE — Patient Instructions (Addendum)
Thanks for coming in today.  I think you have a pulled muscle in your neck.  You are doing the right thing with aleve. You can take 2 twice a day for a week or so. Watch out for high blood pressure and stomach ulcers.  You can take tylenol with the aleve if you need to.  Try to stay active.  Get your sister to check your blood pressure a few times a month. Write it down,and bring it in.

## 2010-10-08 NOTE — Assessment & Plan Note (Signed)
Improved: Closed from top which is less desirable.  However no evidence of recurrent infection or fluid collection.  Will follow. May need removal of sebaceous cyst wall once inflammation completely resolved.

## 2010-10-08 NOTE — Progress Notes (Signed)
Boil right axillia: [name] did well following I&D of the boil. She removed all the packing in a few days and the boil closed up. However she does not experience any drainage or pain or swelling. She did not complete the ABX course. She feels well.   Neck Pain: [name] noted pain in her left lateral neck, shoulder and arm starting yesterday. She was using her laptop in bed in an awkward position. She denies any weakness or numbness in her hands. She notes that the pain goes to her elbow. Pain worse with ROM of her head. She took 2 aleve which helps the pain a lot.   HTN: BP intermittently elevated. Was on medication in the past. Now diet controlled. Has a sister who has a BP cuff and knows how to check BPs.   ROS: As above. No fever or chills.   Exam: VS noted.  Gen: Well NAD HEENT: MMM Neck: No pain on midline. ROM normal.   Mild TTP left Paraspinous and sternocleidomastoid muscles.   Spurling test mildly positive.  Lungs: CTABL Heart: RRR no MRG Axilla: Right: Non tender, no swelling or discharge. Small healing incision noted. Closed.  Exts: Non edematous BL LE Neuro:  Hands Normal sensation and strength BL. Arms normal strength and ROM. Gait normal.

## 2010-11-12 ENCOUNTER — Encounter: Payer: Self-pay | Admitting: Family Medicine

## 2010-11-12 ENCOUNTER — Ambulatory Visit (INDEPENDENT_AMBULATORY_CARE_PROVIDER_SITE_OTHER): Payer: Medicaid Other | Admitting: Family Medicine

## 2010-11-12 DIAGNOSIS — J301 Allergic rhinitis due to pollen: Secondary | ICD-10-CM

## 2010-11-12 DIAGNOSIS — F17200 Nicotine dependence, unspecified, uncomplicated: Secondary | ICD-10-CM

## 2010-11-12 DIAGNOSIS — G43709 Chronic migraine without aura, not intractable, without status migrainosus: Secondary | ICD-10-CM

## 2010-11-12 MED ORDER — NICOTINE 21 MG/24HR TD PT24: 1.0000 | MEDICATED_PATCH | TRANSDERMAL | Status: DC

## 2010-11-12 MED ORDER — FLUTICASONE PROPIONATE 50 MCG/ACT NA SUSP: 1.0000 | Freq: Two times a day (BID) | NASAL | Status: DC

## 2010-11-12 MED ORDER — TOPIRAMATE 25 MG PO TABS: 25.0000 mg | ORAL_TABLET | Freq: Two times a day (BID) | ORAL | Status: DC

## 2010-11-12 MED ORDER — CETIRIZINE HCL 10 MG PO TABS: 10.0000 mg | ORAL_TABLET | Freq: Every day | ORAL | Status: DC

## 2010-11-12 NOTE — Patient Instructions (Signed)
Lounette,  Thank you for coming in to see me today.  For your allergies: try to the Zyrtec and Flonase. Also gargle with warm salt water and on a regular basis. For your smoking: remember the motivators (son's health/setting a good example for him, your health, your wallet saving $$$). Use the patch as a nicotine supplement.  For your ears, I did not see anything alarming on your exam. When you come back for your pap smear remind me to check them again. If they start to really bother you I will try some drops and ear washes, if that dose not help I agree that an ENT referral would be warranted.   Please watch your salt and fatty foods intake and increase exercise, I would prefer to stay away from BP medicine if possible.   For your migraines, continue the current medicine. The toprimax is associated with cleft lips in babies, so since you are sexually active I would recommend contraception (condoms, copper IUD, etc.)  just in case.

## 2010-11-24 ENCOUNTER — Encounter: Payer: Self-pay | Admitting: Family Medicine

## 2010-11-24 NOTE — Progress Notes (Signed)
  Subjective:    Patient ID: [name], female    DOB: October 16, 1975, 35 y.o.   MRN: [id number]  HPI  Here for f/u visit and to discuss the following.  1. Smoking- pt would like to quit. Now smoking 1.5ppd with her long-term boyfriend. Smokes Newports. he in interested in trying nicotine patch.  2. Allergies: pt complaining of ear draining, runny nose, sore throat, cough and congestion each spring. Ear draining associated with pruritus. Pt has outdoor and indoor allergies.  3. Migraines: well controlled. Pt taking topamax and aleve.    Review of Systems  Constitutional: Negative.   HENT: Positive for congestion, sore throat, rhinorrhea, postnasal drip, sinus pressure and ear discharge. Negative for hearing loss, ear pain, nosebleeds, facial swelling, sneezing, drooling, mouth sores, trouble swallowing, neck pain, neck stiffness, dental problem, voice change and tinnitus.   Eyes: Positive for itching.  Respiratory: Negative.   Cardiovascular: Negative.   Gastrointestinal: Negative.   Genitourinary: Negative.   Musculoskeletal: Negative.        Objective:   Physical Exam  Constitutional: She appears well-developed and well-nourished.  HENT:  Head: Normocephalic and atraumatic.  Right Ear: External ear normal.  Left Ear: External ear normal.  Nose: Nose normal.  Mouth/Throat: Oropharynx is clear and moist.  Eyes: Conjunctivae and EOM are normal. Pupils are equal, round, and reactive to light.  Neck: Normal range of motion. Neck supple.  Cardiovascular: Normal rate, regular rhythm, normal heart sounds and intact distal pulses.   Pulmonary/Chest: Effort normal and breath sounds normal.  Neurological: No cranial nerve deficit.          Assessment & Plan:

## 2010-11-25 LAB — POCT PREGNANCY, URINE: Preg Test, Ur: NEGATIVE

## 2010-11-25 LAB — URINALYSIS, ROUTINE W REFLEX MICROSCOPIC
Bilirubin Urine: NEGATIVE
Glucose, UA: NEGATIVE mg/dL
Hgb urine dipstick: NEGATIVE
Ketones, ur: NEGATIVE mg/dL
Nitrite: NEGATIVE
Protein, ur: NEGATIVE mg/dL
Specific Gravity, Urine: 1.016 (ref 1.005–1.030)
Urobilinogen, UA: 0.2 mg/dL (ref 0.0–1.0)
pH: 7 (ref 5.0–8.0)

## 2010-11-26 ENCOUNTER — Telehealth: Payer: Self-pay | Admitting: Family Medicine

## 2010-11-26 NOTE — Telephone Encounter (Signed)
Noted that right earlobe become swollen and became itchy about 2 days. Now right arm has a few patches of itchy skin as well. No fevers or chills. Feels ok. Has not tired anything yet. No new medications.  Recommended hydrocortisone cream.

## 2010-11-28 ENCOUNTER — Encounter: Payer: Self-pay | Admitting: Family Medicine

## 2010-11-28 LAB — BASIC METABOLIC PANEL
BUN: 2 mg/dL — ABNORMAL LOW (ref 6–23)
BUN: 6 mg/dL (ref 6–23)
BUN: 7 mg/dL (ref 6–23)
CO2: 24 mEq/L (ref 19–32)
CO2: 26 mEq/L (ref 19–32)
CO2: 28 mEq/L (ref 19–32)
Calcium: 8.7 mg/dL (ref 8.4–10.5)
Calcium: 9.3 mg/dL (ref 8.4–10.5)
Chloride: 104 mEq/L (ref 96–112)
Chloride: 105 mEq/L (ref 96–112)
Chloride: 109 mEq/L (ref 96–112)
Creatinine, Ser: 0.67 mg/dL (ref 0.4–1.2)
GFR calc Af Amer: >60 mL/min (ref >60)
GFR calc Af Amer: >60 mL/min (ref >60)
GFR calc non Af Amer: >60 mL/min (ref >60)
GFR calc non Af Amer: >60 mL/min (ref >60)
GFR calc non Af Amer: >60 mL/min (ref >60)
Glucose, Bld: 105 mg/dL — ABNORMAL HIGH (ref 70–99)
Glucose, Bld: 119 mg/dL — ABNORMAL HIGH (ref 70–99)
Glucose, Bld: 97 mg/dL (ref 70–99)
Potassium: 3.6 mEq/L (ref 3.5–5.1)
Potassium: 3.7 mEq/L (ref 3.5–5.1)
Sodium: 135 mEq/L (ref 135–145)
Sodium: 138 mEq/L (ref 135–145)
Sodium: 142 mEq/L (ref 135–145)

## 2010-11-28 LAB — CBC
HCT: 34.8 % — ABNORMAL LOW (ref 36.0–46.0)
HCT: 35 % — ABNORMAL LOW (ref 36.0–46.0)
HCT: 39.6 % (ref 36.0–46.0)
Hemoglobin: 10.7 g/dL — ABNORMAL LOW (ref 12.0–15.0)
Hemoglobin: 12 g/dL (ref 12.0–15.0)
MCHC: 32 g/dL (ref 30.0–36.0)
MCHC: 32.7 g/dL (ref 30.0–36.0)
MCHC: 33 g/dL (ref 30.0–36.0)
MCV: 76.7 fL — ABNORMAL LOW (ref 78.0–100.0)
MCV: 77.8 fL — ABNORMAL LOW (ref 78.0–100.0)
MCV: 83.2 fL (ref 78.0–100.0)
MCV: 84.1 fL (ref 78.0–100.0)
Platelets: 258 10*3/uL (ref 150–400)
Platelets: 264 10*3/uL (ref 150–400)
RBC: 4.19 MIL/uL (ref 3.87–5.11)
RBC: 5.16 MIL/uL — ABNORMAL HIGH (ref 3.87–5.11)
RDW: 14.3 % (ref 11.5–15.5)
RDW: 14.5 % (ref 11.5–15.5)
RDW: 14.5 % (ref 11.5–15.5)
WBC: 14.7 10*3/uL — ABNORMAL HIGH (ref 4.0–10.5)
WBC: 15.8 10*3/uL — ABNORMAL HIGH (ref 4.0–10.5)
WBC: 27.6 10*3/uL — ABNORMAL HIGH (ref 4.0–10.5)

## 2010-11-28 LAB — GLUCOSE, CAPILLARY
Glucose-Capillary: 100 mg/dL — ABNORMAL HIGH (ref 70–99)
Glucose-Capillary: 108 mg/dL — ABNORMAL HIGH (ref 70–99)
Glucose-Capillary: 88 mg/dL (ref 70–99)
Glucose-Capillary: 89 mg/dL (ref 70–99)
Glucose-Capillary: 93 mg/dL (ref 70–99)
Glucose-Capillary: 99 mg/dL (ref 70–99)

## 2010-11-28 LAB — DIFFERENTIAL
Basophils Absolute: 0 10*3/uL (ref 0.0–0.1)
Basophils Absolute: 0 10*3/uL (ref 0.0–0.1)
Basophils Relative: 0 % (ref 0–1)
Basophils Relative: 0 % (ref 0–1)
Eosinophils Absolute: 0 10*3/uL (ref 0.0–0.7)
Eosinophils Relative: 0 % (ref 0–5)
Lymphs Abs: 3.6 10*3/uL (ref 0.7–4.0)
Monocytes Absolute: 1.7 10*3/uL — ABNORMAL HIGH (ref 0.1–1.0)
Neutro Abs: 13.4 10*3/uL — ABNORMAL HIGH (ref 1.7–7.7)
Neutrophils Relative %: 75 % (ref 43–77)
Neutrophils Relative %: 76 % (ref 43–77)

## 2010-11-28 LAB — COMPREHENSIVE METABOLIC PANEL
ALT: 22 U/L (ref 0–35)
AST: 16 U/L (ref 0–37)
Albumin: 2.8 g/dL — ABNORMAL LOW (ref 3.5–5.2)
Alkaline Phosphatase: 43 U/L (ref 39–117)
Calcium: 8.3 mg/dL — ABNORMAL LOW (ref 8.4–10.5)
GFR calc Af Amer: >60 mL/min (ref >60)
Potassium: 3.5 mEq/L (ref 3.5–5.1)
Sodium: 139 mEq/L (ref 135–145)
Total Protein: 6 g/dL (ref 6.0–8.3)

## 2010-11-28 LAB — HEMOGLOBIN A1C
Hgb A1c MFr Bld: 6 % (ref 4.6–6.1)
Mean Plasma Glucose: 126 mg/dL

## 2010-11-28 LAB — VANCOMYCIN, TROUGH: Vancomycin Tr: <5 ug/mL — ABNORMAL LOW (ref 10.0–20.0)

## 2010-11-28 LAB — PROTIME-INR: Prothrombin Time: 13 seconds (ref 11.6–15.2)

## 2010-11-28 NOTE — Assessment & Plan Note (Signed)
Unchanged. Plan to try nicotine patch.  Encourage pt to think about perks of quitting.

## 2010-11-28 NOTE — Assessment & Plan Note (Signed)
Assessment: deteriorated. Pt with worsening symptoms during the spring.  Plan to refill flonase and zyrtec. Advise pt to use salt water gargle.

## 2010-11-28 NOTE — Assessment & Plan Note (Signed)
Unchanged. Currently controlled.  Plan to continue Topamax and aleve prn.

## 2011-01-04 NOTE — H&P (Signed)
NAMEJESSICCA, [name]             ACCOUNT NO.:  [id number]   MEDICAL RECORD NO.:  [id number]          PATIENT TYPE:  INP   LOCATION:  0104                         FACILITY:  [facility]   PHYSICIAN:  Ruthy Dick, MD    DATE OF BIRTH:  Sep 24, 1975   DATE OF ADMISSION:  02/26/2009  DATE OF DISCHARGE:                              HISTORY & PHYSICAL   PRIMARY CARE PHYSICIAN:  [facility].   CHIEF COMPLAINT:  Swollen left face for the past 2 days.   HISTORY OF PRESENT ILLNESS:  [name] is a 35 year old African  American female with a past medical history significant for allergies,  depression and history of H. pylori infection for which she was fully  treated.  She came into the hospital today because of swelling to her  left face and left lower eyelid that had been going on for the past 2  days.  She said the swelling started from where she lost a tooth on the  left side and that she has not seen a dentist for over 1 year now.  According to her the  tooth fell down by itself.  She does not know  exactly where the infection came from, but she said the swelling started  gradually and she did not pay a lot of attention to it, but in the last  2 days it just kept getting bigger and bigger and becoming more and more  painful and because of this she came to the hospital for further  evaluation.  She denies having any fever or chills during this time.  No  nausea, no vomiting, no diarrhea or constipation.  No chest pain.  The  patient denies dysuria, frequency, urgency.  Denies abdominal pain.  Denies melanotic stool.  Denies hematochezia.  Denies diplopia and  denies eye symptoms during this time.  Denies photophobia as well.   PAST MEDICAL HISTORY.:  1. Allergies.  2. Depression.  3. Gastroesophageal reflux disease which was resolved once H. pylori      infection was treated.   In the emergency room she was given IV vancomycin.   SOCIAL HISTORY:  Lives with her significant other  and her child.  Smokes  about a pack of cigarettes per day and started at the age of 69.  Denies  alcohol use and illicit drug use.   FAMILY HISTORY:  Significant for hypertension, diabetes and also breast  cancer.  She was not specific which member of the family had these  conditions.   MEDICATIONS:  She is on:  1. Claritin 10 mg daily.  2. Trazodone 50 mg nightly.  3. Zoloft 100 mg daily.   ALLERGIES:  No known drug allergies.   REVIEW OF SYSTEMS:  12-point review of system was done, was negative  except as noted in the history of present illness.   PHYSICAL EXAMINATION:  The patient seen and examined in the emergency  room.  She is alert, oriented x3 in no acute cardiopulmonary distress.  Temperature 98.9, pulse 74, respiration 18, blood pressure 124/93 and  saturating 99% on room air.  HEENT:  Normocephalic with  swelling on the left face.  Specifically the  lower eyelid is swollen and tender extending toward the upper jaw  extending to the maxillary region on the left side.  The right side is  normal.  The patient is able to open her eyelids and vision in both eyes  is intact.  No redness in the left eye either.  NECK:  Supple.  No JVD.  No lymphadenopathy or thyromegaly.  ABDOMEN:  Soft, nontender.  EXTREMITIES:  No clubbing, no cyanosis, no edema.  CARDIOVASCULAR:  First and second heart sounds only.  Central Nervous System:  Nonfocal.  Cranial nerves appear to be intact  and ocular movement in the left eye is intact and as noted vision is  also intact.   LABORATORY STUDIES/INVESTIGATIONS:  1. CT scan of the maxillofacial region with contrast was read as      having a left infra-orbital premolar cellulitis.  No post septal      involvement or abscess identified.  There was also severe dental      caries and focal lucency involving the left maxillary second      bicuspid.  It was unclear whether this was the origin of the      inflammatory process and severe dental  disease, also involving the      right maxillary, was noted.  Dental followup was recommended.  2. WBC 15.8 with 76% neutrophils.  BMP fairly normal.   ASSESSMENT:  1. Preseptal cellulitis.  2. Depression.  3. Dental caries.  4. Allergies.  5. History of Helicobacter pylori infection fully treated.  6. Tobacco abuse.   PLAN OF CARE:  1. The patient will be admitted to the Medicine/Surgery floor and      treated with IV Unasyn and vancomycin for 7 days.  I am not sure at      this time how long the antibiotic therapy needs to be intravenous      for all 7 days.  I believe an Infectious Disease opinion can be      sought at some point once the initial symptoms have gotten better.      Discussed with emergency room physician at length about the      patient's presentation and the general consensus was that since      there was no vision  involvement there was no need to get an      ophthalmologist involved at this time.  2. As already noted the patient will need a dental followup most      likely as outpatient but at the discretion of the attending      physician, inpatient consult may also be placed.  3. Deep vein thrombosis prophylaxis will be provided.      Ruthy Dick, MD  Electronically Signed     GU/MEDQ  D:  02/26/2009  T:  02/26/2009  Job:  [id number]

## 2011-01-04 NOTE — Consult Note (Signed)
[name], Byrd             ACCOUNT NO.:  [id number]   MEDICAL RECORD NO.:  [id number]          PATIENT TYPE:  INP   LOCATION:  1510                         FACILITY:  [facility]   PHYSICIAN:  Charlynne Pander, D.D.S.DATE OF BIRTH:  08/29/1975   DATE OF CONSULTATION:  03/03/2009  DATE OF DISCHARGE:                                 CONSULTATION   [name] is a 35 year old female recently admitted with a history  of left facial swelling.  Dental consultation requested to evaluate and  provide treatment as indicated for possible dental etiology.   MEDICAL HISTORY:  1. History of left facial swelling since 02/24/09      a.     Current IV antibiotic therapy since February 26, 2009.      b.     Probable dental etiology  2. History of depression.  3. History of gastroesophageal reflux disorder.  4. History of being H. pylori positive, but is status post treatment.   ALLERGIES/ADVERSE DRUG REACTIONS/:  None known.   MEDICATIONS:  1. Vancomycin 1250 mg every 8 hours.  2. Claritin 10 mg daily.  3. Desyrel 50 mg at bedtime.  4. Lovenox 50 mg at bedtime subcutaneously.  5. Unasyn 3 g IV every 6 hours.  6. Benadryl 50 mg IV every 8 hours.  7. Dilaudid 0.5 to 1 mg every 6 hours as needed.   SOCIAL HISTORY:  The patient lives with her significant other and child.  Patient with a history of smoking 1 pack per day since the age of 75.  The patient is a nondrinker.  The patient denies use of other illicit  drugs.   FAMILY HISTORY:  Is positive for hypertension, diabetes mellitus, and  breast cancer.   FUNCTIONAL ASSESSMENT:  The patient was independent for ADLs prior to  this admission.   REVIEW OF SYSTEMS:  This is reviewed from the chart for this admission.   CHIEF COMPLAINT:  Dental consultation requested to evaluate history of  left facial swelling.   HISTORY OF PRESENT ILLNESS:  Patient with a history of left facial  swelling since February 24, 2009.  The patient subsequently went  to the  emergency room, and was admitted on February 26, 2009.  The patient then  placed on IV antibiotic therapies including vancomycin and Unasyn.  Patient with good resolution of left facial swelling since then.  The  patient indicates that the upper left premolar was the offending tooth  which lead to most of the problems.  The patient currently denies acute  toothaches or dental pain at this time.  The patient indicates that  previously had hurt at an 8/10 intensity.  The patient indicates that  she last saw a dentist 1 year ago to have a lower left molar extracted.  The patient denies complications from that dental extraction.  The  patient does not see the dentist for regular dental care.   DENTAL EXAMINATION:  GENERAL:  The patient is a well-developed, well-  nourished female in no acute distress.  VITAL SIGNS:  Blood pressure is 135/87, pulse rate is 67, respirations  are 16,  temperature 99.0.  HEAD AND NECK:  There is no significant left facial swelling at this  time.  There is slight left submandibular lymphadenopathy noted at this  time.  No right lymphadenopathy noted.  The patient denies acute TMJ  symptoms.  INTRAORAL:  Patient with normal saliva.  I do not see any evidence of  intraoral abscess at this time.  DENTITION:  The patient is missing only tooth #17.  There are  significant diastemas noted in the mouth, however.  Tooth #1 and #13 are  present as retained root segments.  DENTAL CARIES:  There are multiple dental caries affecting teeth #1 and  #13.  ENDODONTIC:  Patient with periapical pathology at the apices of teeth #1  and #13.  CROWN OR BRIDGE:  There are no crown or bridge restorations.  PROSTHODONTIC:  There are no dentures.  OCCLUSION:  Patient with a poor occlusal scheme secondary to missing  teeth and supereruption of the unopposed teeth into the edentulous  areas.  Patient with multiple diastemas as well.Marland Kitchen   RADIOGRAPHIC INTERPRETATION:  A panoramic  x-ray was taken on March 03, 2009, and supplemented with a full series of dental radiographs.     There is a missing tooth #17.  Teeth #1 and #13 are present as  retained root segments.  There is periapical pathology at the apices of  teeth #1 and #13.  There are multiple diastemas noted.  There is a poor  occlusal scheme noted.   ASSESSMENT:  1. History of acute pulpitis symptoms and left facial swelling.  There      is significant resolution of swelling and pain with the current      intravenous antibiotic therapy.  2. Periapical pathology at the apices of teeth #1 and #13.  3. Missing tooth #17.  4. Supereruption of tooth #16 into the edentulous area.  5. Multiple diastemas.  6. Chronic periodontitis bone loss.  7. Plaque and calculus accumulations.  8. History of oral neglect.   PLAN/RECOMMENDATIONS:  1. I have discussed risks, benefits, and complications of various      treatment options with the patient in relationship to her medical      and dental conditions.  We discussed various treatment options to      include no treatment, extraction of indicated teeth with      alveoloplasty, periodontal therapy, dental restorations, root canal      therapy, crown or bridge therapy, implant therapy, and replacing      the missing teeth as indicated.  The patient currently wishes to      proceed with extraction of teeth #1, #13, and #16 with      alveoloplasty and gross debridement of the remaining dentition at      this time.  This will be performed in the operating room with      general anesthesia at the request of the patient.  This has been      scheduled as an outpatient for Thursday, March 05, 2009.  Dr. Berkley Harvey      has been contacted and agrees with the ability to discharge patient      today, and have patient follow up for outpatient operating room      procedure through the presurgical testing short stay at [facility].  I will obtain      preop lab testing  today.  2. Discussion of findings with medical team and coordination of future  operating room procedure through presurgical testing and Dr. Berkley Harvey      as indicated.      Charlynne Pander, D.D.S.  Electronically Signed     RFK/MEDQ  D:  03/03/2009  T:  03/03/2009  Job:  [id number]   cc:   Charlestine Massed, MD

## 2011-01-04 NOTE — Discharge Summary (Signed)
[name], [name]             ACCOUNT NO.:  [id number]   MEDICAL RECORD NO.:  [id number]          PATIENT TYPE:  INP   LOCATION:  1510                         [facility]ACILITY[facility]  [facility]   PHYSICIAN:  Charlestine Massed, MDDATE OF BIRTH:  November 30, 1975   DATE OF ADMISSION:  02/26/2009  DATE OF DISCHARGE:                               DISCHARGE SUMMARY   PRIMARY CARE PHYSICIAN:  [facility].   DENTAL SURGERY:  Dr. Kristin Bruins   REASON FOR ADMISSION:  Swelling of the left face for 2 days.   DISCHARGE DIAGNOSES:  1. Acute odontogenic infection with possible dental root abscess and      dental caries.  2. Facial cellulitis secondary to a dental root infection.  3. Gastroesophageal reflux, currently resolved.  4. History of depression.   DISCHARGE MEDICATIONS:  1. Augmentin 875 mg p.o. b.i.d. for 10 days.  2. Chlorhexidine oral rinse b.i.d.  3. Claritin 10 mg p.o. daily.  4. Potassium 15 mg p.o. daily.  5. Zoloft 100 mg p.o. daily.   HOSPITAL COURSE BY PROBLEM:  1. Acute odontogenic infection - possible dental root abscess, dental      caries, associated with facial cellulitis - the patient was      admitted with facial cellulitis and dental root infection.  She      said that yesterday she was saying that there was swelling at the      root of her upper tooth which ruptured by itself, and she was able      to see pus-like material from that, so the patient possibly had a      dental root abscess associated with cellulitis/ infection on the      face.  She was continued on IV antibiotics.  The patient's      infection has resolved.  Her facial swelling resolved considerably.      She does not have any further pain in the tooth.  The patient was      seen by Dr. Kristin Bruins, dental surgery, today.  He said that the      patient needs extraction of a few of her teeth for the caries, so      the patient has been scheduled for outpatient extraction on      Thursday, as she did not want to stay  back for the extraction      today.  She will be seen in the outpatient clinic on Thursday.  She      will be continued on Augmentin for the next 10 days, to complete a      total of 14 days of antibiotics.  She has been advised to use      Chlorhexidine mouthwash b.i.d. for proper oral hygiene.  She has      also been educated that if there is increase in the facial swelling      or if she experiences any difficulty in opening her mouth, or if      she experiences any difficult in swallowing food, then go to the      emergency room as soon as possible.  She has  verbalized      understanding for that.  2. History of depression.  To continue trazodone and Zoloft as before.      There are currently no issues for that.   FOLLOW UP TESTS DURING THIS ADMISSION:  1. CT of maxillofacial area shows left intraorbital _facial cellulitis      and severe dental caries and focal lucency of the left upper      maxillary second bicuspid which was not reported as such in the CT      scan report, but correlating with the history, we could conceivably      think that this is possibly due to a dental abscess.  2. Ultrasound of bilateral breasts done on February 27, 2009 shows no      mammographic evidence of malignancy and no evidence of axillary      adenopathy and screening mammography advised from the age of 59.  76. Orthopantogram done on March 02, 2009.  Stable maxillary dental      caries.  No acute osseous findings.   DISPOSITION:  Discharge back home.   FOLLOWUP:  1. Follow with Dr. Kristin Bruins on March 05, 2009 at 10:30 a.m. for tooth      extraction.  2. Follow up with [facility] in 1-2 weeks with primary care doctor.   INSTRUCTIONS:  1. Proper dental hygiene.  2. Chlorhexidine mouthwash also advised.   A total of 40 minutes spent on the discharge.      Charlestine Massed, MD  Electronically Signed     UT/MEDQ  D:  03/03/2009  T:  03/03/2009  Job:  [id number]   cc:   Charlynne Pander,  D.D.S.  Fax: [phone]   Dr. Lenore Cordia(?)  Dental Surgery

## 2011-01-04 NOTE — Op Note (Signed)
[name], [name]             ACCOUNT NO.:  [id number]   MEDICAL RECORD NO.:  [id number]          PATIENT TYPE:  AMB   LOCATION:  DAY                          FACILITY:  [facility]   PHYSICIAN:  Charlynne Pander, D.D.S.DATE OF BIRTH:  October 12, 1975   DATE OF PROCEDURE:  03/05/2009  DATE OF DISCHARGE:                               OPERATIVE REPORT   PREOPERATIVE DIAGNOSES:  1. History of facial swelling.  2. History of acute pulpitis.  3. Apical periodontitis.  4. Chronic periodontitis.  5. Dental caries.  6. Accretions.  7. Multiple retained roots.   POSTOPERATIVE DIAGNOSES:  1. History of facial swelling.  2. History of acute pulpitis.  3. Apical periodontitis.  4. Chronic periodontitis.  5. Dental caries.  6. Accretions.  7. Multiple retained roots.   OPERATION:  1. Extraction of tooth numbers 1 and 13 with alveoloplasty.  2. Gross debridement of remaining dentition.   SURGEON:  Charlynne Pander, D.D.S.   ASSISTANT:  Public house manager (Sales executive).   ANESTHESIA:  General anesthesia via nasoendotracheal tube.   MEDICATIONS:  1. Ancef 1 g IV prior to invasive dental procedures.  2. Local anesthesia with total utilization of 4 carpules each      containing 34 mg of lidocaine with 0.017 mg of epinephrine.   SPECIMENS:  There were 2 teeth that were discarded.   DRAINS:  None.   CULTURES:  None.   COMPLICATIONS:  None.   ESTIMATED BLOOD LOSS:  50 mL.   FLUIDS:  1100 mL lactated Ringer solution.   INDICATIONS:  The patient was recently admitted with a history of left  facial swelling.  The patient was administered IV antibiotic therapy  with good resolution of the swelling.  Dental consultation was requested  to evaluate and provide treatment as indicated for the acute problems.  The patient was examined and treatment planned for extraction of tooth  numbers 1 and 13 with alveoloplasty and gross debridement of remaining  dentition.  This treatment plan  was formulated to decrease the risk and  complications associated with dental infection from further affecting  the patient's systemic health.   OPERATIVE FINDINGS:  The patient was examined on operating room #6.  The  teeth were identified for extraction.  The patient noted be affected by  a history of acute pulpitis, apical periodontitis, retained root  segments, chronic periodontitis, accretions, dental caries.  The  aforementioned necessitated removal of tooth numbers 1 and 13 with  alveoloplasty and gross debridement of the remaining dentition as  indicated.  Please note initially tooth number 16 was identified for  extraction, but due to further evaluation of the root structure, it was  felt that this would be best achieved by an oral surgeon.  Tooth number  32 can be evaluate for extraction at that same time.   DESCRIPTION OF PROCEDURE:  The patient was brought to the main operating  room #6.  The patient then placed in supine position on the operating  room table.  General anesthesia was then induced via nasoendotracheal  tube.  The patient was then prepped and draped in usual  manner for  dental medicine procedure.  A time out was performed, the patient was  identified, and procedures were verified.  A throat pack was placed at  this time.  The oral cavity was then thoroughly examined and findings  noted above.  The patient was then ready for the dental medicine  procedure as follows:   Local anesthesia was that was administered sequentially with a total  utilization of 4 carpules each containing 34 mg of lidocaine with 0.017  mg of epinephrine as well.  The maxillary right and left quadrants were first approached.Anesthesia  was delivered via infiltration utilizing the lidocaine with epinephrine.  A total of 4 carpules were utilized.  Tooth #1 was then approached.  A  15 blade incision was then made from the maxillary right tuberosity and  extended to the mesial #3.  A  surgical flap was then carefully  reflected.  Appropriate amounts of buccal and interseptal bone were  removed around the roots of associated with tooth #1.  Tooth #1 was then  subluxated with a series of straight elevators.  Tooth #1 was then  delivered without complication with a 150 forceps.  Alveoplasty was then  performed utilizing rongeurs and bone file.  Excessive tissue was then  removed with a series of 15 blade incisions and soft tissue pickups.  The tissues were then approximated and trimmed appropriately.  Surgical  site was then irrigated with copious amounts of sterile saline.  Surgical site was then closed from maxillary right tuberosity and  extended to the distal of #2 utilizing 3-0 chromic gut suture in a  continuous interrupted suture technique x1.  One individual interrupted  sutures then placed between tooth numbers 2 and 3 interproximally.   At this point in time, the maxillary left quadrant was approached.  A 15  blade incision was made from the distal of #14 and extended to the  mesial of #12.  A surgical flap was then reflected on the buccal and  palatal aspects.  The retained root #13 was then identified.  Bone was  removed with surgical handpiece, bur and copious amounts sterile saline.  The root was then elevated and removed eventually with a 150 forceps  without complications.  Alveoplasty was then performed utilizing  rongeurs and bone file.  Surgical site was then irrigated with copious  amounts sterile saline.  The surgical site was then closed from the  mesial of #14 and extended to the distal of #12 appropriately utilizing  continuous interrupted suture technique x1.  Interproximal sutures were  then placed between tooth numbers 11 and 12 to further close the  surgical site.   At this point in time, the remaining dentition was approached.  A KaVo  Sonic Scaler was then utilized to remove significant accretions.  A  series of hand curettes were utilized  to refine removal of accretions.  The KaVo Sonic Scaler was then again used at the end of the procedure to  further clean the teeth.  At this point in time, the entire mouth was  irrigated with copious amounts of sterile saline.  The patient was  examined for complications, seeing none, the dental medicine procedure  was deemed to be complete.  The throat pack was removed at this time.  The patient was then handed off to the anesthesia team for final  disposition after proper amount of time.  The patient was extubated and  taken to the post-anesthesia care unit with stable vital signs with good  oxygenation level.  All counts were correct for dental medicine  procedure.  The patient will be seen in approximately one week for  evaluation for suture removal.  The patient will then be referred to the  primary dentist of her choice and subsequent referral to an oral surgeon  for extraction of tooth numbers 16 and 32 as indicated.      Charlynne Pander, D.D.S.  Electronically Signed     RFK/MEDQ  D:  03/05/2009  T:  03/05/2009  Job:  [id number]   cc:   Charlestine Massed, MD

## 2011-01-05 ENCOUNTER — Ambulatory Visit (INDEPENDENT_AMBULATORY_CARE_PROVIDER_SITE_OTHER): Payer: Medicaid Other | Admitting: Family Medicine

## 2011-01-05 ENCOUNTER — Encounter: Payer: Self-pay | Admitting: Family Medicine

## 2011-01-05 VITALS — BP 132/86 | HR 88 | Temp 98.4°F | Ht 69.0 in | Wt 234.0 lb

## 2011-01-05 DIAGNOSIS — H60509 Unspecified acute noninfective otitis externa, unspecified ear: Secondary | ICD-10-CM

## 2011-01-05 DIAGNOSIS — H60549 Acute eczematoid otitis externa, unspecified ear: Secondary | ICD-10-CM | POA: Insufficient documentation

## 2011-01-05 MED ORDER — HYDROCORTISONE 2.5 % EX OINT: TOPICAL_OINTMENT | Freq: Two times a day (BID) | CUTANEOUS | Status: AC

## 2011-01-05 NOTE — Progress Notes (Signed)
  Subjective:    Patient ID: [name], female    DOB: 1976/07/27, 35 y.o.   MRN: [id number]  HPI  EAR ITCHING  Location: bilateral Right worse than left  Onset: on and off for years.   Has been treated but long ago and not sure what used  Symptoms  Sensation of fullness: yes Ear discharge: yes URI symptoms: no  Fever: no    Tinnitus: no Dizziness: no Hearing loss: no Headache: no Toothache: no Jaw click: no Rashes or lesions: no Facial muscle weakness: no  Red Flags Recent trauma: no PMH recurrent OM: no  PMH prior ear surgery: no  Tubes currently: Neither  Recent antibiotic usage (last 30 days):  no Diabetes or Immunosuppresion:  no  Review of Symptoms - see HPI  PMH - Smoking status noted.  She understands quitting would help   Review of Systems     Objective:   Physical Exam    Ears:  External ear exam shows no significant lesions or deformities.  Otoscopic examination reveals red irritated crusty canals, tympanic membranes are intact bilaterally without bulging, retraction, inflammation or discharge. Hearing is grossly normal bilateral  Neck:  No deformities, thyromegaly, masses, or tenderness noted.   Supple with full range of motion without pain.      Assessment & Plan:

## 2011-01-05 NOTE — Patient Instructions (Signed)
Your have an allergy reaction in your ears  Use the hydrocortisone ointment Twice daily on a Qtip.  Do not put anything else in your ear  If you get severe pain or swelling in your ear come back  Your ear should be better in 1 week.   Then cut on the number of times you use the Qtip

## 2011-01-05 NOTE — Assessment & Plan Note (Signed)
Findings more consistent with irritation than infection, since more itchy than painful and canal appears chronically irritated.  Will treat with topical steroids and follow

## 2011-01-21 ENCOUNTER — Encounter: Payer: Medicaid Other | Admitting: Family Medicine

## 2011-02-01 ENCOUNTER — Encounter: Payer: Medicaid Other | Admitting: Family Medicine

## 2011-02-04 ENCOUNTER — Encounter: Payer: Self-pay | Admitting: Family Medicine

## 2011-02-04 ENCOUNTER — Ambulatory Visit (INDEPENDENT_AMBULATORY_CARE_PROVIDER_SITE_OTHER): Payer: Medicaid Other | Admitting: Family Medicine

## 2011-02-04 ENCOUNTER — Other Ambulatory Visit (HOSPITAL_COMMUNITY)
Admission: RE | Admit: 2011-02-04 | Discharge: 2011-02-04 | Disposition: A | Discharge status: Home / Self Care | Payer: Medicaid Other | Source: Ambulatory Visit | Attending: Family Medicine | Admitting: Family Medicine

## 2011-02-04 VITALS — BP 127/88 | HR 101 | Temp 99.1°F | Wt 231.0 lb

## 2011-02-04 DIAGNOSIS — Z01419 Encounter for gynecological examination (general) (routine) without abnormal findings: Secondary | ICD-10-CM | POA: Insufficient documentation

## 2011-02-04 DIAGNOSIS — H60509 Unspecified acute noninfective otitis externa, unspecified ear: Secondary | ICD-10-CM

## 2011-02-04 DIAGNOSIS — Z124 Encounter for screening for malignant neoplasm of cervix: Secondary | ICD-10-CM

## 2011-02-04 DIAGNOSIS — E669 Obesity, unspecified: Secondary | ICD-10-CM

## 2011-02-04 DIAGNOSIS — F17200 Nicotine dependence, unspecified, uncomplicated: Secondary | ICD-10-CM

## 2011-02-04 DIAGNOSIS — K2960 Other gastritis without bleeding: Secondary | ICD-10-CM

## 2011-02-04 DIAGNOSIS — H60549 Acute eczematoid otitis externa, unspecified ear: Secondary | ICD-10-CM

## 2011-02-04 DIAGNOSIS — R030 Elevated blood-pressure reading, without diagnosis of hypertension: Secondary | ICD-10-CM

## 2011-02-04 LAB — LIPID PANEL
Cholesterol: 123 mg/dL (ref 0–200)
HDL: 37 mg/dL — ABNORMAL LOW (ref >39)
Triglycerides: 58 mg/dL (ref <150)

## 2011-02-04 MED ORDER — VARENICLINE TARTRATE 0.5 MG X 11 & 1 MG X 42 PO MISC: ORAL | Status: DC

## 2011-02-04 MED ORDER — VARENICLINE TARTRATE 0.5 MG X 11 & 1 MG X 42 PO MISC: ORAL | Status: AC

## 2011-02-04 NOTE — Progress Notes (Signed)
  Subjective:    Patient ID: [name], female    DOB: 18-Apr-1976, 35 y.o.   MRN: [id number] SUBJECTIVE:  35 y.o. female for annual routine Pap and checkup.  Allergies: Aripiprazole and Escitalopram oxalate  Patient's last menstrual period was 01/12/2011.  ROS:  Feeling well. No chest pain on exertion. Some dyspnea on exertion. She reports some lower extremity swelling x many years. Made worse when being on feet a lot and it is worse in the summertime. Some regurgitation, and shortness of breath at night.  No abdominal pain, change in bowel habits, black or bloody stools. Pt reports having bowel movement once a week that is hard. This is a chronic problem.  No urinary tract symptoms. GYN ROS: normal menses, no abnormal bleeding, pelvic pain or discharge, no breast pain or new or enlarging lumps on self exam, she complains of white vaginal discharge. She denies vaginal itching or irritation.  No neurological complaints.  OBJECTIVE:  The patient appears well, alert, oriented x 3, in no distress. BP 127/88  Pulse 101  Temp(Src) 99.1 F (37.3 C) (Oral)  Wt 231 lb (104.781 kg)  LMP 01/12/2011 ENT normal.  Neck supple. No adenopathy or thyromegaly. PERLA. Lungs are clear, good air entry, no wheezes, rhonchi or rales. S1 and S2 normal, no murmurs, regular rate and rhythm. Abdomen soft without tenderness, guarding, mass or organomegaly. Extremities show 1+  Edema b/l, normal peripheral pulses. Neurological is normal, no focal findings.  Pelvic exam: normal external genitalia, vulva, vagina, cervix, uterus and adnexa, no palpable internal organs, PAP: Pap smear done today, exam chaperoned by Garen Grams.   ASSESSMENT:  well woman  PLAN:  pap smear return annually or prn]  HPI    Review of Systems     Objective:   Physical Exam        Assessment & Plan:

## 2011-02-04 NOTE — Patient Instructions (Addendum)
[name],  Thank you for coming in today. For smoking cessation, start the Chantix. Take the chantix for one week then quit smoking.  For exercise- walking in morning 3x/week. Weight loss goal for this month- 5 lbs.  Smoking Cessation, Tips for Success YOU CAN QUIT SMOKING If you are ready to quit smoking, congratulations! You have chosen to help yourself be healthier. Cigarettes bring nicotine, tar, carbon monoxide, and other irritants into your body. Your lungs, heart, and blood vessels will be able to work better without these poisons. There are lots of different ways to quit smoking. Nicotine gum, nicotine patches, a nicotine inhaler, or nicotine nasal spray help with physical craving. Hypnosis, support groups, and medicines help break the habit of smoking. Here are some tips to help you quit for good. 1. Throw away all cigarettes.  2. Clean and remove all ashtrays from your home, work, and car.  3. On a card, write down your reasons for quitting. Carry the card with you and read it when you get the urge to smoke.  4. Cleanse your body of nicotine. Drink enough water and fluids to keep your urine clear or pale yellow. Do this after quitting to flush the nicotine from your body.  5. Learn to predict your moods. Do not let a bad situation be your excuse to have a cigarette. Some situations in your life might tempt you into wanting a cigarette.  6. Never have "just one" cigarette. It leads to wanting another and another. Remind yourself of your decision to quit.  7. Change habits associated with smoking. If you smoked while driving or when feeling stressed, try other activities to replace smoking. Stand up when drinking your coffee. Brush your teeth after eating. Sit in a different chair when you read the paper. Avoid alcohol while trying to quit, and try to drink fewer caffeinated beverages. Alcohol and caffeine may urge you to smoke.  8. Avoid foods and drinks that can trigger a desire to smoke, such  as sugary or spicy foods and alcohol.  9. Ask people who smoke not to smoke around you.  10. Have something planned to do right after eating or having a cup of coffee. Take a walk or exercise to perk you up. This will help to keep you from overeating.  11. Try a relaxation exercise to calm you down and decrease your stress. Remember, you may be tense and nervous in the first 2 weeks after you quit, but this will pass.  12. Find new activities to keep your hands busy. Play with a pen, coin, or rubber band. Doodle or draw things on paper.  13. Brush your teeth right after eating. This will help cut down on the craving for the taste of tobacco after meals. You can try mouthwash, too.  14. Use oral substitutes, such as lemon drops, carrots, a cinnamon stick, or chewing gum, in place of cigarettes. Keep them handy so they are available when you have the urge to smoke.  15. When you have the urge to smoke, try deep breathing.  16. Designate your home as a nonsmoking area.  17. If you are a heavy smoker, ask your caregiver about a prescription for nicotine chewing gum. It can ease your withdrawal from nicotine.  18. Reward yourself. Set aside the cigarette money you save and buy yourself something nice.  19. Look for support from others. Join a support group or smoking cessation program. Ask someone at home or at work to help you  with your plan to quit smoking.  20. Always ask yourself, "Do I need this cigarette or is this just a reflex?" Tell yourself, "Today, I choose not to smoke," or "I do not want to smoke." You are reminding yourself of your decision to quit, even if you do smoke a cigarette.  HOW WILL I FEEL WHEN I QUIT SMOKING?  The benefits of not smoking start within days of quitting.   You may have symptoms of withdrawal because your body is used to nicotine (the addictive substance in cigarettes). You may crave cigarettes, be irritable, feel very hungry, cough often, get headaches, or have  difficulty concentrating.   The withdrawal symptoms are only temporary. They are strongest when you first quit but will go away within 10 to 14 days.   When withdrawal symptoms occur, stay in control. Think about your reasons for quitting. Remind yourself that these are signs that your body is healing and getting used to being without cigarettes.   Remember that withdrawal symptoms are easier to treat than the major diseases that smoking can cause.   Even after the withdrawal is over, expect periodic urges to smoke. However, these cravings are generally short-lived and will go away whether you smoke or not. Do not smoke!   If you relapse and smoke again, do not lose hope. Of the people who quit, 75% relapse. Most smokers quit 3 times before they are successful.   If you relapse, do not give up! Plan ahead and think about what you will do the next time you get the urge to smoke.  LIFE AS A NONSMOKER: MAKE IT FOR A MONTH, MAKE IT FOR LIFE Day 1 Hang this page where you will see it every day. Day 2 Get rid of all ashtrays, matches, and lighters. Day 3 Drink water. Breathe deeply between sips. Day 4 Avoid places with smoke-filled air, such as bars, clubs, or the smoking section of restaurants. Day 5 Keep track of how much money you save by not smoking. Day 6 Avoid boredom. Keep a good book with you or go to the movies. Day 7 Reward yourself! One week without smoking! Day 8 Make a dental appointment to get your teeth cleaned. Day 9 Decide how you will turn down a cigarette before it is offered to you. Day 10 Review your reasons for quitting. Day 11 Distract yourself. Stay active to keep your mind off smoking and to relieve tension. Take a walk, exercise, read a book, do a crossword puzzle, or try a new hobby. Day 12 Exercise. Get off the bus before your stop or use stairs instead of escalators. Day 13 Call on friends for support and encouragement. Day 14 Reward yourself! Two weeks without  smoking! Day 15 Practice deep breathing exercises. Day 16 Bet a friend that you can stay a nonsmoker. Day 17 Ask to sit in nonsmoking sections of restaurants. Day 18 Hang up "No Smoking" signs. Day 19 Think of yourself as a nonsmoker. Day 20 Each morning, tell yourself you will not smoke. Day 21 Reward yourself! Three weeks without smoking! Day 22 Think of smoking in negative ways.   Remember how it stains your teeth, gives you bad breath, and shortens your breath. Day 23 Eat a nutritious breakfast. Day 24 Do not relive your days as a smoker. Day 25 Hold a pencil in your hand when talking on the telephone. Day 26 Tell all your friends you do not smoke. Day 27 Think about how much better food  tastes. Day 28 Remember, one cigarette is one too many. Day 29 Take up a hobby that will keep your hands busy. Day 30 Congratulations! One month without smoking! Give yourself a big reward. Your caregiver can direct you to community resources or hospitals for support, which may include:  Group support.   Education.   Hypnosis.   Subliminal therapy.  Document Released: 05/06/2004 Document Re-Released: 11/02/2009 [facility] Patient Information 2011 [place], [place].IMPORTANT: HOW TO USE THIS INFORMATION:  This is a summary and does NOT have all possible information about this product. This information does not assure that this product is safe, effective, or appropriate for you. This information is not individual medical advice and does not substitute for the advice of your health care professional. Always ask your health care professional for complete information about this product and your specific health needs.    VARENICLINE - ORAL ([id number])    COMMON BRAND NAME(S): Chantix    WARNING:  Infrequently, varenicline may cause serious mental/mood changes, even after stopping the medication. Drinking alcohol while taking this medication may increase the risk for mental/mood changes. Quitting  smoking itself may also cause mental/mood changes. Stop taking varenicline and tell your doctor or pharmacist immediately if you have symptoms such as depression/suicidal thoughts, agitation, aggressiveness, or other unusual thoughts or behavior.    USES:  This medication is used in combination with a stop-smoking program (e.g., education materials, support group, counseling) to help you quit smoking. Varenicline works by blocking nicotine's actions in the brain. Quitting smoking decreases your risk of heart and lung disease, as well as cancer.    HOW TO USE:  Read the Medication Guide provided by your pharmacist before you start taking varenicline and each time you get a refill. If you have any questions, consult your doctor or pharmacist. Follow your doctor's directions carefully. Before beginning treatment with this drug, set a date to quit smoking. Begin taking varenicline 1 week before the quit date as follows unless directed otherwise by your doctor. When you first start taking this medication, take one 0.5-milligram tablet once a day for 3 days, then increase to one 0.5-milligram tablet twice a day for 4 days. The dose is slowly increased to lessen the chance of side effects (e.g., nausea, unusual dreams). During this first week, it is okay to smoke. Stop smoking on the quit date and begin taking the dose prescribed by your doctor twice a day for the rest of the 12-week treatment period. If this medication comes in a dosing package, carefully follow the directions on the dosing package. There are two types of dosing packs, a starting pack and a continuing pack, each containing different strengths of this medication. If this medication comes in a bottle, carefully follow your doctor's directions on the prescription label. If you have any questions about how to take this medication, talk to your doctor or pharmacist. Take this medication by mouth after food and with a full glass of water. Dosage is based on  your medical condition and response to therapy. Do not increase your dose or take this medication more often than prescribed. Your condition will not improve any faster, and the risk of serious side effects may be increased. Do not take more than 1 milligram twice a day. Use this medication regularly to get the most benefit from it. To help you remember, take it at the same time(s) each day. Inform your doctor if you continue to smoke after a few weeks of treatment. If you  are successful and cigarette-free after 12 weeks of treatment, your doctor may recommend another 12 weeks of treatment with varenicline.    SIDE EFFECTS:  See also Warning section. Nausea, headache, vomiting, drowsiness, gas, constipation, trouble sleeping, unusual dreams, or changes in taste may occur. If any of these effects persist or worsen, tell your doctor or pharmacist promptly. Remember that your doctor has prescribed this medication because he or she has judged that the benefit to you is greater than the risk of side effects. Many people using this medication do not have serious side effects. A very serious allergic reaction to this drug is rare. However, seek immediate medical attention if you notice any symptoms of a serious allergic reaction, including: rash, itching/swelling (especially of the face/tongue/throat), severe dizziness, trouble breathing. This is not a complete list of possible side effects. If you notice other effects not listed above, contact your doctor or pharmacist. In the [place] - Call your doctor for medical advice about side effects. You may report side effects to FDA at 1-800-FDA-1088. In [place] - Call your doctor for medical advice about side effects. You may report side effects to [facility] [place] at [phone].    PRECAUTIONS:  Before taking varenicline, tell your doctor or pharmacist if you are allergic to it; or if you have any other allergies. This product may contain inactive ingredients, which can cause  allergic reactions or other problems. Talk to your pharmacist for more details. Before using this medication, tell your doctor or pharmacist your medical history, especially of: kidney disease (especially kidney dialysis), mental/mood disorders. This drug may make you drowsy. Do not drive, use machinery, or do any activity that requires alertness until you are sure you can perform such activities safely. Avoid alcoholic beverages. Kidney function declines as you grow older. This medication is removed by the kidneys. Therefore, elderly people may be at a greater risk for nausea and other side effects while using this drug. During pregnancy, this medication should be used only when clearly needed. Discuss the risks and benefits with your doctor. It is unknown if this medication passes into breast milk. Consult your doctor before breast-feeding.    DRUG INTERACTIONS:  Your doctor or pharmacist may already be aware of any possible drug interactions and may be monitoring you for them. Do not start, stop, or change the dosage of any medicine before checking with your doctor or pharmacist first. Before using this medication, tell your doctor or pharmacist of all prescription and nonprescription/herbal products you may use, especially of: nicotine replacement therapy (e.g., patch, gum, nasal spray). Smoking can affect the way your body removes certain drugs. When you stop smoking, your doses of these drugs may need to be adjusted by your doctor. Tell your doctor if you take any of the following medications. Some of the drugs that smoking may affect, among others, are: "blood thinners" (e.g., warfarin), insulin, theophylline. This document does not contain all possible interactions. Therefore, before using this product, tell your doctor or pharmacist of all the products you use. Keep a list of all your medications with you, and share the list with your doctor and pharmacist.    OVERDOSE:  If overdose is suspected,  contact your local poison control center or emergency room immediately. [place] residents should call the [place] National Poison Hotline at (615)080-2819. [place] residents should call a provincial poison control center.    NOTES:  Do not share this medication with others. Laboratory and/or medical tests (e.g., kidney function) should be performed  periodically to monitor your progress or check for side effects. Consult your doctor for more details. Getting regular exercise and maintaining a nutritious diet, along with using educational materials, receiving counseling, and attending support groups, may help you to successfully quit smoking. Consult your doctor for details.    MISSED DOSE:  If you miss a dose, take it as soon as you remember. If it is near the time of the next dose, skip the missed dose and resume your usual dosing schedule. Do not double the dose to catch up.    STORAGE:  Store at room temperature at 77 degrees F (25 degrees C) away from light and moisture. Brief storage between 59-86 degrees F (15-30 degrees C) is permitted. Do not store in the bathroom. Keep all medicines away from children and pets. Do not flush medications down the toilet or pour them into a drain unless instructed to do so. Properly discard this product when it is expired or no longer needed. Consult your pharmacist or local waste disposal company for more details about how to safely discard your product.    Information last revised September 2010 Copyright(c) 2010 [employer].

## 2011-02-09 ENCOUNTER — Encounter: Payer: Self-pay | Admitting: Family Medicine

## 2011-02-09 DIAGNOSIS — K2960 Other gastritis without bleeding: Secondary | ICD-10-CM | POA: Insufficient documentation

## 2011-02-09 NOTE — Assessment & Plan Note (Addendum)
Patient would like to lose 60 lbs. We discussed exercise plan and dietary changes to meet goal.  She has agreed to walk 3x/wk and her weight loss goal for this month is 5 lbs.  Plan to check screening lipids and A1c today.

## 2011-02-09 NOTE — Assessment & Plan Note (Signed)
Still in prehypertensive range. She did not bring log of BP recordings. Plan to follow. The goal is weight loss to lower blood pressure.

## 2011-02-09 NOTE — Assessment & Plan Note (Signed)
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 She feels that her current symptoms are the same a before. Plan to have patient return to lab for blood draw for H. Pylori IgG serology. She is not taking antacids.

## 2011-02-09 NOTE — Assessment & Plan Note (Signed)
Improving with use of topical steroids.

## 2011-02-09 NOTE — Assessment & Plan Note (Signed)
Unchanged. Pt was not able to obtain prescribed nicotine patch bc of cost.  Plan to try Chantix after discussing risk and possible side effects and pt was given handout with information.

## 2011-02-10 ENCOUNTER — Encounter: Payer: Self-pay | Admitting: Family Medicine

## 2011-02-10 ENCOUNTER — Ambulatory Visit (INDEPENDENT_AMBULATORY_CARE_PROVIDER_SITE_OTHER): Payer: Medicaid Other | Admitting: Family Medicine

## 2011-02-10 VITALS — BP 120/80 | HR 85 | Temp 98.7°F | Wt 230.0 lb

## 2011-02-10 DIAGNOSIS — J020 Streptococcal pharyngitis: Secondary | ICD-10-CM

## 2011-02-10 DIAGNOSIS — J029 Acute pharyngitis, unspecified: Secondary | ICD-10-CM

## 2011-02-10 MED ORDER — PENICILLIN V POTASSIUM 500 MG PO TABS: 500.0000 mg | ORAL_TABLET | Freq: Three times a day (TID) | ORAL | Status: DC

## 2011-02-10 NOTE — Progress Notes (Signed)
  Subjective:     [name] is a 35 y.o. female who presents for evaluation of sore throat. Associated symptoms include sore throat. Onset of symptoms was 2 days ago, and have been gradually worsening since that time. She is drinking plenty of fluids. She has had a recent close exposure to someone with proven streptococcal pharyngitis.  The following portions of the patient's history were reviewed and updated as appropriate: allergies, current medications, past family history, past medical history, past social history, past surgical history and problem list.  Review of Systems Pertinent items are noted in HPI.    Objective:    BP 120/80  Pulse 85  Temp(Src) 98.7 F (37.1 C) (Oral)  Wt 230 lb (104.327 kg)  LMP 01/12/2011 General:  alert, cooperative and no distress  Mouth:  abnormal findings: exudates present and marked oropharyngeal erythema  Neck: mild anterior cervical adenopathy.   Laboratory Strep test done. Results:positive    Assessment:     Acute Pharyngitis, likely  Strep throat    Plan:    Patient placed on antibiotics. Use of OTC analgesics recommended as well as salt water gargles. Patient advised of the risk of peritonsillar abscess formation. Patient advised that he will be infectious for 24 hours after starting antibiotics. Follow up as needed.

## 2011-02-11 ENCOUNTER — Encounter: Payer: Self-pay | Admitting: Family Medicine

## 2011-03-31 ENCOUNTER — Telehealth: Payer: Self-pay | Admitting: Family Medicine

## 2011-03-31 NOTE — Telephone Encounter (Signed)
Pt states that she is having pain with urination and back pain.  States that she thought her boyfriend was off tomorrow and could bring her but found out he will not be able to.  Advised pt that it could be a UTI and she would need to be seen and have her urine tested before any meds given.  Advised she should try UC, pt agreeable to plan.  Appt cancelled for tomorrow, pt aware Britiney Blahnik, Maryjo [place]

## 2011-03-31 NOTE — Telephone Encounter (Signed)
Having back and leg pain and cramping down below and wants to talk to someone about this.  Has made an appt for tomorrow afternoon.  She isn't sure she will be able to keep the appt.  She would like to talk to the nurse.

## 2011-04-01 ENCOUNTER — Ambulatory Visit: Payer: Medicaid Other | Admitting: Family Medicine

## 2011-05-12 ENCOUNTER — Emergency Department (HOSPITAL_COMMUNITY)
Admission: EM | Admit: 2011-05-12 | Discharge: 2011-05-13 | Disposition: A | Discharge status: Home / Self Care | Payer: Medicaid Other | Attending: Emergency Medicine | Admitting: Emergency Medicine

## 2011-05-12 ENCOUNTER — Telehealth: Payer: Self-pay | Admitting: Family Medicine

## 2011-05-12 DIAGNOSIS — M7989 Other specified soft tissue disorders: Secondary | ICD-10-CM | POA: Insufficient documentation

## 2011-05-12 DIAGNOSIS — L03019 Cellulitis of unspecified finger: Secondary | ICD-10-CM | POA: Insufficient documentation

## 2011-05-12 NOTE — Telephone Encounter (Signed)
Has a swollen thumb and it is now swollen and is really throbbing.  Wanted to try and come in but there is nothing until tomorrow morning.  Will be going to Urgent Care.

## 2011-05-12 NOTE — Telephone Encounter (Signed)
Reviewed.    Agree with plan.

## 2011-05-20 LAB — POCT PREGNANCY, URINE
Operator id: 290111
Preg Test, Ur: NEGATIVE

## 2011-05-20 LAB — URINALYSIS, ROUTINE W REFLEX MICROSCOPIC
Glucose, UA: NEGATIVE
Hgb urine dipstick: NEGATIVE
Specific Gravity, Urine: 1.018
pH: 7.5

## 2011-05-20 LAB — URINE MICROSCOPIC-ADD ON

## 2011-05-20 LAB — GC/CHLAMYDIA PROBE AMP, GENITAL: Chlamydia, DNA Probe: NEGATIVE

## 2011-05-20 LAB — WET PREP, GENITAL: Clue Cells Wet Prep HPF POC: NONE SEEN

## 2011-05-20 LAB — RPR: RPR Ser Ql: NONREACTIVE

## 2011-05-24 ENCOUNTER — Telehealth: Payer: Self-pay | Admitting: Family Medicine

## 2011-05-24 NOTE — Telephone Encounter (Signed)
Pt called emergency line.  She reports sore throat x 1 week, cough, runny nose,  No fever.  Normal appetite. States she has been dealing with this for 1 week and wants to know if she should be seen.  Told pt to call at 8:30 am to get a work-in appointment for evaluation.  Pt states understanding.

## 2011-05-25 ENCOUNTER — Encounter: Payer: Self-pay | Admitting: Family Medicine

## 2011-05-25 ENCOUNTER — Ambulatory Visit (INDEPENDENT_AMBULATORY_CARE_PROVIDER_SITE_OTHER): Payer: Medicaid Other | Admitting: Family Medicine

## 2011-05-25 VITALS — BP 125/91 | HR 87 | Temp 98.6°F | Ht 69.5 in | Wt 225.5 lb

## 2011-05-25 DIAGNOSIS — J020 Streptococcal pharyngitis: Secondary | ICD-10-CM

## 2011-05-25 DIAGNOSIS — J029 Acute pharyngitis, unspecified: Secondary | ICD-10-CM

## 2011-05-25 LAB — POCT RAPID STREP A (OFFICE): Rapid Strep A Screen: POSITIVE — AB

## 2011-05-25 MED ORDER — PENICILLIN V POTASSIUM 500 MG PO TABS: 500.0000 mg | ORAL_TABLET | Freq: Three times a day (TID) | ORAL | Status: AC

## 2011-05-25 NOTE — Progress Notes (Signed)
  Subjective:    Patient ID: [name], female    DOB: 05-24-1976, 35 y.o.   MRN: [id number]  HPI  1 week of sore throat.  Now with nasal congestion, nonproductive cough, fatigue.  No fever, chills, rash, nausea, diarrhea.    Had + strep 3 months ago, treated with full 10 day course of pcn and resolved.  No known sick contacts  Review of Systems See hpi    Objective:   Physical Exam  GEN: Alert & Oriented, No acute distress HEENT: [place]/AT. EOMI, PERRLA, no conjunctival injection or scleral icterus.  Bilateral tympanic membranes intact without erythema or effusion.  .  Nares without edema or rhinorrhea.  Oropharynx is with posterior erythema or ?exudates vs tonsoliths and no mild edema.  No anterior or posterior cervical lymphadenopathy. CV:  Regular Rate & Rhythm, no murmur Respiratory:  Normal work of breathing, CTAB Abd:  + BS, soft, no tenderness to palpation Ext: no pre-tibial edema       Assessment & Plan:

## 2011-05-25 NOTE — Assessment & Plan Note (Signed)
Also had a positive 3 months ago.  Will treat again with PCN TID x 10 days.  If continues to test positive with minimal to mild concordance clinically, consider testing when well to check for carrier status.

## 2011-05-25 NOTE — Patient Instructions (Signed)
Strep Throat, Adult Strep throat is an infection of the throat caused by a germ (bacteria). A bacteria is a type of tiny living thing that may cause disease. It is most common in late winter and early spring but can happen any time of the year. For patients who have not had their tonsils removed before, this germ can cause Strep tonsillitis. If someone has had the tonsils removed, they can still get Strep throat. Strep throat is contagious and can spread through coughing or sneezing and other close contact with someone who has this problem. SYMPTOMS Common symptoms may include:  Fever.   Painful, red tonsils and/or throat.   White or yellow spots on tonsils and/or throat.   Swollen, tender lymph nodes or "glands" of the neck and/or under the jaw.   Red rash all over the body (uncommon).  DIAGNOSIS In most cases, a "rapid strep test" can help your caregiver make the diagnosis in a few minutes. If this test is not available, a light swab of infected area can be used for a throat culture to see if Strep bacteria are present. The results of a throat culture take about 2 days. TREATMENT Strep throat is generally treated with antibiotic medicine. HOME CARE INSTRUCTIONS  Gargle with 1 teaspoon of salt in 1 cup of warm water, 3 to 4 times per day or as necessary for comfort.   Family members with a sore throat or fever should have a medical examination or throat culture. If there has been a positive throat culture in the family, your caregiver may treat the rest of the family without seeing them. This depends upon his knowledge of their condition and his familiarity with you and your family.   You may return to work when you feel able.   Only take over-the-counter or prescription medicines for pain, discomfort or fever as directed by your caregiver.  SEEK MEDICAL CARE IF:  You have an oral temperature above 101.   You develop large glands in your neck.   You develop a rash, cough or  earache.   You cough up green, yellow-brown or bloody sputum.   You have pain or discomfort not controlled by medications or if problems seem to be getting worse rather than better.  SEEK IMMEDIATE MEDICAL CARE IF:  You develop any new symptoms such as vomiting, severe headache, stiff or painful neck, chest pain, shortness of breath, trouble breathing or swallowing.   You develop severe throat pain, drooling or changes in voice.   You develop swelling of the neck, or the skin on the neck becomes red and tender.   You have an oral temperature above 101, not controlled by medicine.  Document Released: 08/05/2000 Document Re-Released: 11/02/2009 [employer] Patient Information 2011 [employer]. 

## 2011-06-09 ENCOUNTER — Telehealth: Payer: Self-pay | Admitting: Family Medicine

## 2011-06-09 NOTE — Telephone Encounter (Signed)
[name] would like to speak to a nurse about some stomach problems she is having.  She is constipated and she has tried everything she can think of and is hoping the nurse can suggest something else she can take.

## 2011-06-09 NOTE — Telephone Encounter (Signed)
Patient states she has had no BM for at least 7 days. Generally will have BM twice a week. She feels gasey and has some discomfort left lower abdomen. Vomited X 1  two days ago.  She does feel nauseated.  Has tried prune juice, e- lax, mag  citrate 1/2 bottle ,  No results. Consulted with Dr. Deirdre Priest and he advises patient needs office visit for evaluation. Offered work in today or tomorrow AM . Appointment scheduled for tomorrow AM. Advised if she starts vomiting, has severe abd pain or fever to call back sooner.

## 2011-06-10 ENCOUNTER — Encounter: Payer: Medicaid Other | Admitting: Family Medicine

## 2011-06-10 NOTE — Progress Notes (Signed)
  Subjective:    Patient ID: [name], female    DOB: 27-Jan-1976, 35 y.o.   MRN: [id number]  HPI    Review of Systems     Objective:   Physical Exam        Assessment & Plan:

## 2011-06-13 ENCOUNTER — Ambulatory Visit: Payer: Medicaid Other | Admitting: Family Medicine

## 2011-06-13 NOTE — Progress Notes (Signed)
This encounter was created in error - please disregard.

## 2011-06-14 ENCOUNTER — Ambulatory Visit: Payer: Medicaid Other | Admitting: Family Medicine

## 2011-06-24 ENCOUNTER — Encounter: Payer: Self-pay | Admitting: Family Medicine

## 2011-06-24 ENCOUNTER — Ambulatory Visit (INDEPENDENT_AMBULATORY_CARE_PROVIDER_SITE_OTHER): Payer: Medicaid Other | Admitting: Family Medicine

## 2011-06-24 DIAGNOSIS — K5900 Constipation, unspecified: Secondary | ICD-10-CM | POA: Insufficient documentation

## 2011-06-24 NOTE — Assessment & Plan Note (Signed)
Likely secondary to poor diet. Discuss increasing water intake as well as fresh fruits and vegetables. See instructions for plan for next 2 weeks. If no improvement would likely benefit from rectal exam. She seems to rather have her PCP perform this.

## 2011-06-24 NOTE — Patient Instructions (Signed)
For the next 2 weeks, use a stool softener twice a day.  Colace is a good, over the counter stool softener.   I would also recommend prune juice everyday for two weeks.   Miralax is a very strong, over the counter medication that helps people have bowel movements.  It takes a day to work.  It is the same medicine that people use when they have to have a colonoscopy. Use the Miralax two or three times a day until you have a bowel movement two days in a row.  If you start having diarrhea, stop.    Constipation   It was good to meet you today.  Go ahead and come back in 4-6 weeks  Constipation is when you poop (have a bowel movement) less than 3 times a week. Sometimes the poop (stool) is small and hard. There are many different causes of constipation.   HOME CARE    Go to the bathroom when you feel the urge to go.     Drink enough fluid to keep your pee (urine) clear or pale yellow.     Eat more high-fiber foods.     Drink prune juice or eat stewed fruits in the morning.     Exercise.    Ask your doctor if you should take medicine to help you poop or take fiber supplements.  GET HELP RIGHT AWAY IF:    You see blood in your poop or in the toilet.     Your belly (abdomen) gets hard and puffy (swollen).     You keep throwing up (vomiting).     You have a lot of pain.  MAKE SURE YOU:    Understand these instructions.     Will watch your condition.     Will get help right away if you are not doing well or get worse.  Document Released: 01/25/2008 Document Revised: 04/20/2011 Document Reviewed: 01/25/2008 [facility] Patient Information 2012 [place], [place].

## 2011-06-24 NOTE — Progress Notes (Signed)
  Subjective:    Patient ID: [name], female    DOB: 10-Apr-1976, 35 y.o.   MRN: [id number]  HPI 1.  Constipation:  Long-time problem per patient presents at least since the patient was teenager. She describes one to 2 bowel movements a week. Describes bowel movements as hard and sometimes painful. Denies any blood in toilet bowl or when wiping. States she uses prune juice about once a month to help with her constipation. Denies any stool softeners. Diet is mostly fatty and she does not eat many vegetables or fruits. Her words were "if it doesn't, I cannot eat it." Very little water intake, mostly drinks juice and milk.  No fevers or chills. Last bowel movement was she thinks on Tuesday and 2 weeks prior to that     Review of Systems See HPI above for review of systems.       Objective:   Physical Exam  Gen:  Alert, cooperative patient who appears stated age in no acute distress.  Vital signs reviewed. Abd:  Soft/nondistended/minimally tender BL lower quadrants.  Good bowel sounds throughout all four quadrants.  No masses noted.  Rectal deferred at patient request      Assessment & Plan:

## 2011-10-10 ENCOUNTER — Other Ambulatory Visit: Payer: Self-pay | Admitting: Family Medicine

## 2011-10-10 NOTE — Telephone Encounter (Signed)
Refill request

## 2011-10-13 ENCOUNTER — Other Ambulatory Visit: Payer: Self-pay | Admitting: Family Medicine

## 2011-10-13 NOTE — Telephone Encounter (Signed)
Refill request

## 2011-12-19 ENCOUNTER — Telehealth: Payer: Self-pay | Admitting: Family Medicine

## 2011-12-19 NOTE — Telephone Encounter (Signed)
Patient would like to speak to the nurse about some pains in her shoulders and whether she needs to be seen.

## 2011-12-19 NOTE — Telephone Encounter (Signed)
Returned call to patient.  Was pulling vines this weekend.  Now has right shoulder pain.  Pain was 8/10.  Took some Aleve and pain is "a little better."  Patient requesting an appt for tomorrow and work-in appt scheduled for tomorrow with crosscover.  Gaylene Brooks, RN

## 2011-12-20 ENCOUNTER — Encounter: Payer: Self-pay | Admitting: Family Medicine

## 2011-12-20 ENCOUNTER — Ambulatory Visit (INDEPENDENT_AMBULATORY_CARE_PROVIDER_SITE_OTHER): Payer: Medicaid Other | Admitting: Family Medicine

## 2011-12-20 VITALS — BP 160/90 | HR 84 | Ht 69.5 in | Wt 234.2 lb

## 2011-12-20 DIAGNOSIS — T148XXA Other injury of unspecified body region, initial encounter: Secondary | ICD-10-CM

## 2011-12-20 NOTE — Progress Notes (Signed)
  Subjective:    Patient ID: [name], female    DOB: 01/31/1976, 36 y.o.   MRN: [id number]  HPI Right shulder and right chest pain since yesterday: Had been working in yard 2 days ago- shoulder pain started that night after doing yard work. Describes pain in right shouder and chest as aching.  Movement of arm made pain in chest and shoulder worse.  Aleve improved the pain. This morning the soreness and aching has improved.  No fever.  No rashes.  No cold symptoms.  No trauma to shoulder or side.  No weakness in arm or shoulder.  Normal sensation.  Patient states that upon awakening this am- pain seems to have resolved.  Wanted to come in for evaluation just to make sure that "everything is ok"   Review of Systems As per above.    Objective:   Physical Exam  Constitutional: She is oriented to person, place, and time. She appears well-developed and well-nourished.  HENT:  Head: Normocephalic and atraumatic.  Cardiovascular: Normal rate.   Pulmonary/Chest: Effort normal. No respiratory distress.  Musculoskeletal: She exhibits no edema.       Right shoulder exam: rom wnl.  No pain with palpation of right shoulder or chest.  Normal sensation in arms bilateral.  Normal strength in arms bilateral.  Empty can test negative.  No pain with abduction of shoulder, internal or external rotation of arms.   Neurological: She is alert and oriented to person, place, and time.  Psychiatric: She has a normal mood and affect. Her behavior is normal.          Assessment & Plan:

## 2011-12-20 NOTE — Patient Instructions (Signed)
I think that you shoulder and chest pain is due to muscle overuse. Continue to use aleve 2 x per day as needed for muscle pain.  I am glad that you are already improving.  Return in June for your yearly physical exam.

## 2011-12-21 DIAGNOSIS — T148XXA Other injury of unspecified body region, initial encounter: Secondary | ICD-10-CM | POA: Insufficient documentation

## 2011-12-21 NOTE — Assessment & Plan Note (Signed)
Pain in right shoulder and right chest-now resolved.  Most likely was a muscle strain 2/2 muscle overuse in yard.  This is supported by it improving with aleve and by the short course (2 days) of musce discomfort.  Pt to take aleve as needed for pain over the next week.  Pt to Return if no improvement or if new or worsening of symptoms.

## 2012-01-26 ENCOUNTER — Telehealth: Payer: Self-pay | Admitting: Family Medicine

## 2012-01-26 ENCOUNTER — Encounter (HOSPITAL_COMMUNITY): Payer: Self-pay | Admitting: *Deleted

## 2012-01-26 ENCOUNTER — Emergency Department (HOSPITAL_COMMUNITY)
Admission: EM | Admit: 2012-01-26 | Discharge: 2012-01-26 | Disposition: A | Discharge status: Home / Self Care | Payer: Medicaid Other | Attending: Emergency Medicine | Admitting: Emergency Medicine

## 2012-01-26 DIAGNOSIS — Y9239 Other specified sports and athletic area as the place of occurrence of the external cause: Secondary | ICD-10-CM | POA: Insufficient documentation

## 2012-01-26 DIAGNOSIS — X500XXA Overexertion from strenuous movement or load, initial encounter: Secondary | ICD-10-CM | POA: Insufficient documentation

## 2012-01-26 DIAGNOSIS — S0993XA Unspecified injury of face, initial encounter: Secondary | ICD-10-CM | POA: Insufficient documentation

## 2012-01-26 DIAGNOSIS — T148XXA Other injury of unspecified body region, initial encounter: Secondary | ICD-10-CM

## 2012-01-26 DIAGNOSIS — S199XXA Unspecified injury of neck, initial encounter: Secondary | ICD-10-CM | POA: Insufficient documentation

## 2012-01-26 DIAGNOSIS — F17200 Nicotine dependence, unspecified, uncomplicated: Secondary | ICD-10-CM | POA: Insufficient documentation

## 2012-01-26 MED ORDER — HYDROCODONE-ACETAMINOPHEN 5-325 MG PO TABS: 1.0000 | ORAL_TABLET | Freq: Four times a day (QID) | ORAL | Status: AC | PRN

## 2012-01-26 NOTE — ED Provider Notes (Cosign Needed)
History   This chart was scribed for Benny Lennert, MD by Charolett Bumpers . The patient was seen in room STRE2/STRE2.    CSN: [id number]  Arrival date & time 01/26/12  1825   First MD Initiated Contact with Patient 01/26/12 1935      Chief Complaint  Patient presents with  . multiple symptoms     (Consider location/radiation/quality/duration/timing/severity/associated sxs/prior treatment) HPI Comments: Patient reports constant, moderate neck pain that started today. Patient states that the pain is located in the muscles. Patient states that she was playing with her son yesterday on a playground when she believes she may have strained a muscle. Patient denies any injuries. Patient states that she took ibuprofen for her symptoms with no relief. Patient states that her neck pain is worsened with movement.   Patient is a 36 y.o. female presenting with neck injury. The history is provided by the patient.  Neck Injury This is a new problem. The current episode started 12 to 24 hours ago. The problem occurs constantly. The problem has not changed since onset.Pertinent negatives include no chest pain, no abdominal pain and no shortness of breath. The symptoms are aggravated by bending. The symptoms are relieved by nothing. She has tried acetaminophen for the symptoms. The treatment provided no relief.    History reviewed. No pertinent past medical history.  History reviewed. No pertinent past surgical history.  No family history on file.  History  Substance Use Topics  . Smoking status: Current Everyday Smoker -- 0.5 packs/day for 19 years    Types: Cigarettes  . Smokeless tobacco: Not on file   Comment: down to 0.5 PPD  . Alcohol Use: No    OB History    Grav Para Term Preterm Abortions TAB SAB Ect Mult Living                  Review of Systems  Constitutional: Negative for fever and chills.  HENT: Positive for neck pain.   Respiratory: Negative for shortness of  breath.   Cardiovascular: Negative for chest pain.  Gastrointestinal: Negative for nausea, vomiting and abdominal pain.  Neurological: Negative for weakness.  All other systems reviewed and are negative.    Allergies  Aripiprazole; Escitalopram oxalate; and Latex  Home Medications   Current Outpatient Rx  Name Route Sig Dispense Refill  . CETIRIZINE HCL 10 MG PO TABS  TAKE 1 TABLET BY MOUTH EVERY DAY 30 tablet 1  . FLUTICASONE PROPIONATE 50 MCG/ACT NA SUSP  USE 1 SPRAY IN EACH NOSTRIL TWICE A DAY 16 g 1  . IBUPROFEN 200 MG PO TABS Oral Take 800 mg by mouth every 6 (six) hours as needed. For pain    . TRAZODONE HCL 100 MG PO TABS Oral Take 100 mg by mouth at bedtime.      . TOPIRAMATE 25 MG PO TABS Oral Take 1 tablet (25 mg total) by mouth 2 (two) times daily. 60 tablet 11    BP 139/98  Pulse 77  Temp(Src) 98.5 F (36.9 C) (Oral)  Resp 24  SpO2 99%  LMP 12/26/2011  Physical Exam  Constitutional: She is oriented to person, place, and time. She appears well-developed.  HENT:  Head: Normocephalic and atraumatic.  Eyes: Conjunctivae and EOM are normal. No scleral icterus.  Neck: Neck supple. No thyromegaly present.       Tenderness to left lateral neck.   Cardiovascular: Normal rate and regular rhythm.  Exam reveals no gallop and no friction  rub.   No murmur heard. Pulmonary/Chest: No stridor. She has no wheezes. She has no rales. She exhibits no tenderness.  Abdominal: She exhibits no distension. There is no tenderness. There is no rebound.  Musculoskeletal: Normal range of motion. She exhibits no edema.  Lymphadenopathy:    She has no cervical adenopathy.  Neurological: She is oriented to person, place, and time. Coordination normal.  Skin: No rash noted. No erythema.  Psychiatric: She has a normal mood and affect. Her behavior is normal.    ED Course  Procedures (including critical care time)  DIAGNOSTIC STUDIES: Oxygen Saturation is 99% on room air, normal by my  interpretation.    COORDINATION OF CARE:  1939: Discussed planned course of treatment with the patient, who is agreeable at this time.     Labs Reviewed - No data to display No results found.   No diagnosis found.    MDM     The chart was scribed for me under my direct supervision.  I personally performed the history, physical, and medical decision making and all procedures in the evaluation of this patient.Benny Lennert, MD 01/26/12 [phone]

## 2012-01-26 NOTE — Telephone Encounter (Signed)
Patient called reporting that since this morning she has been having problems with intermittent chest pains. Chest pains are located in her central chest and radiate to her left jaw and left arm. They are not associated with exertion. He seemed to be associated with deep breaths. The patient is also complaining of a headache but no nausea, vomiting, diarrhea, or significant cough. The patient is never had problems like this before. The patient is no known history of coronary artery disease.  While I seriously doubt that the patient's symptoms are cardiac in nature, she is quite distressed about them. Accordingly I have suggested she go either to urgent care or to the emergency department to be seen and evaluated this evening.

## 2012-01-26 NOTE — ED Notes (Signed)
The pt has a headache neck and chest pain.  Worse with movment of her neck.  She has had these symptoms this am.  [name] she was playing on her sons playground  Playing on the parrell bars etc.  Today she is stiff

## 2012-01-26 NOTE — Discharge Instructions (Signed)
Follow up if not improving

## 2012-01-31 ENCOUNTER — Encounter: Payer: Medicaid Other | Admitting: Family Medicine

## 2012-02-06 ENCOUNTER — Encounter: Payer: Medicaid Other | Admitting: Family Medicine

## 2012-02-07 ENCOUNTER — Encounter: Payer: Medicaid Other | Admitting: Family Medicine

## 2012-02-13 ENCOUNTER — Other Ambulatory Visit: Payer: Self-pay | Admitting: *Deleted

## 2012-02-14 MED ORDER — FLUTICASONE PROPIONATE 50 MCG/ACT NA SUSP: 1.0000 | Freq: Two times a day (BID) | NASAL | Status: DC

## 2012-02-16 ENCOUNTER — Encounter: Payer: Self-pay | Admitting: Family Medicine

## 2012-02-16 ENCOUNTER — Ambulatory Visit (INDEPENDENT_AMBULATORY_CARE_PROVIDER_SITE_OTHER): Payer: Medicaid Other | Admitting: Family Medicine

## 2012-02-16 VITALS — BP 119/82 | HR 90 | Temp 99.0°F | Ht 69.5 in | Wt 228.0 lb

## 2012-02-16 DIAGNOSIS — F411 Generalized anxiety disorder: Secondary | ICD-10-CM

## 2012-02-16 DIAGNOSIS — Z01419 Encounter for gynecological examination (general) (routine) without abnormal findings: Secondary | ICD-10-CM | POA: Insufficient documentation

## 2012-02-16 DIAGNOSIS — Z0000 Encounter for general adult medical examination without abnormal findings: Secondary | ICD-10-CM

## 2012-02-16 MED ORDER — AMITRIPTYLINE HCL 25 MG PO TABS: 25.0000 mg | ORAL_TABLET | Freq: Every day | ORAL | Status: DC

## 2012-02-16 NOTE — Progress Notes (Signed)
Subjective:     Patient ID: [name], female   DOB: Oct 21, 1975, 36 y.o.   MRN: [id number]  HPI 36 yo F presents for well woman exam and to discuss the following: 1. Insomnia: sleeps 2-3 hrs per night. Goes to bed at 9 but cannot sleep. Is busy throughout the day with her son. Denies depression. Admit to anxiety. Taking trazodone without relief.   2. Bump on R upper thigh posterior: present x 2-3 weeks. Itchy, not painful. Admits to picking at it. Admit to growth in the size of the bump.   3. Well woman: last pap 2012, normal. Does self breast exam, no masses. LMP 01/22/12. Periods usually q mos x 3 days. LMP x 10 days. Has baseline of heavy blood flow with cramping.   Review of Systems As per HPI  Objective:   Physical Exam  Constitutional: She is oriented to person, place, and time. She appears well-developed and well-nourished. No distress.  Genitourinary: Rectum normal, vagina normal and uterus normal. Pelvic exam was performed with patient prone. Cervix exhibits no motion tenderness, no discharge and no friability. Right adnexum displays no mass, no tenderness and no fullness. Left adnexum displays no mass, no tenderness and no fullness.  Neurological: She is alert and oriented to person, place, and time.  Skin:     Psychiatric: She has a normal mood and affect. Her behavior is normal. Judgment and thought content normal.   BP 119/82  Pulse 90  Temp 99 F (37.2 C) (Oral)  Ht 5' 9.5" (1.765 m)  Wt 228 lb (103.42 kg)  BMI 33.19 kg/m2  LMP 01/22/2012  Assessment and Plan:

## 2012-02-16 NOTE — Assessment & Plan Note (Signed)
A: overall well woman. Normal breast and GU exam. P:  F/u in 1 year -pap due in 2015.

## 2012-02-16 NOTE — Assessment & Plan Note (Signed)
A: declined. Associated with insomnia. P: stop trazodone. Start elavil F/u in 1 month

## 2012-02-16 NOTE — Patient Instructions (Addendum)
[name],  Thank you for coming in to see me today. For your insomnia: please stop trazadone and start elavil nightly.  Please f/u in 2 weeks to make sure this is an effective medication for you.   The area on your upper thigh is nothing to worry about. Most likely on ingrown hair follicle. Please let me know if it becomes painful. Do your best not to pick at it.   Dr. Armen Pickup

## 2012-03-26 ENCOUNTER — Emergency Department (HOSPITAL_COMMUNITY)
Admission: EM | Admit: 2012-03-26 | Discharge: 2012-03-26 | Discharge status: Absent without leave | Payer: Self-pay | Source: Home / Self Care

## 2012-04-26 ENCOUNTER — Ambulatory Visit (INDEPENDENT_AMBULATORY_CARE_PROVIDER_SITE_OTHER): Payer: Medicaid Other | Admitting: Family Medicine

## 2012-04-26 ENCOUNTER — Encounter: Payer: Self-pay | Admitting: Family Medicine

## 2012-04-26 ENCOUNTER — Telehealth: Payer: Self-pay | Admitting: Sports Medicine

## 2012-04-26 VITALS — BP 126/88 | HR 76 | Temp 98.5°F | Ht 69.5 in | Wt 222.0 lb

## 2012-04-26 DIAGNOSIS — H6090 Unspecified otitis externa, unspecified ear: Secondary | ICD-10-CM

## 2012-04-26 DIAGNOSIS — H60399 Other infective otitis externa, unspecified ear: Secondary | ICD-10-CM

## 2012-04-26 MED ORDER — CIPROFLOXACIN-DEXAMETHASONE 0.3-0.1 % OT SUSP: 4.0000 [drp] | Freq: Two times a day (BID) | OTIC | Status: AC

## 2012-04-26 MED ORDER — CIPROFLOXACIN-HYDROCORTISONE 0.2-1 % OT SUSP: 3.0000 [drp] | Freq: Two times a day (BID) | OTIC | Status: AC

## 2012-04-26 NOTE — Assessment & Plan Note (Signed)
Bilaterally, but left worse than right. Patient has had this before. No current red flags. Will give Cipro-HC drops to use 3 drops both ears BID x1 week. Patient can continue using ibuprofen as needed for pain. RTC if develops fever, worsening ear pain, or overall feels ill. Patient agrees.

## 2012-04-26 NOTE — Addendum Note (Signed)
Addended by: Macy Mis on: 04/26/2012 01:55 PM   Modules accepted: Orders

## 2012-04-26 NOTE — Progress Notes (Signed)
Subjective:     Patient ID: [name], female   DOB: 07-Dec-1975, 36 y.o.   MRN: [id number]  HPI  EAR PAIN  Location: bilateral   Onset: 2 days ago  Patient has history of recurrent swimmers ear. Has been told she has "wet ear canals" in the past. Currently, her ears feel the same way  Symptoms  Sensation of fullness: yes Ear discharge: no URI symptoms: no, but does have seasonal allergies  Fever: no    Tinnitus: no Dizziness: no Hearing loss: yes, on the left Headache: no Toothache: no Rashes or lesions: no  Red Flags Recent trauma: no PMH recurrent OM: no  PMH prior ear surgery: no  Tubes currently: Neither  Recent antibiotic usage (last 30 days):  no Diabetes or Immunosuppresion:  No  History reviewed: 1 pack per day smoker  Review of Systems See HPI above    Objective:   Physical Exam Gen: Alert, oriented. No acute distress HEENT: AT, [place]. Eyes appear watery. Posterior pharynx with some erythema, no pustules or lesions.   Right ear- mild erythema of canal. TM +white reflex without injection  Left ear- Tender to touch of external ear. Swelling and erythema of canal. Difficult to visualize TM, but appears white. Neck: No LAD, but TTP posterior auricular on left Heart: RRR Neuro: Grossly intact. CN 2-12 wnl    Assessment:     36 yo F with otitis externa    Plan:     See problem list

## 2012-04-26 NOTE — Patient Instructions (Signed)
It was nice to see you today. It looks like you have swimmers ear, or an external ear infection. I have sent Cipro drops to the [facility] for [facility]. Use 3 drops in both ears two times per day for the next week. After you put the drops in, keep your head tilted for 1 minute.  If your ears get worse, you develop fever, or you overall feel sick, please come back to clinic.  Take care! Thanks for coming in today! Amber M. Hairford, M.D.

## 2012-04-26 NOTE — Telephone Encounter (Signed)
Pt called 24 hour emergency line @ 735AM complaining of ear pain, pressure and feeling like she was in a swimming pool.  Encouraged her to wait until 830 to call and schedule an appointment as a work in clinic.  States she may need to be seen in the ED.  Reinforced trying to make appointment in clinic.  Instructed may take OTC pain medicines as needed.  Andrena Mews, DO [name] Resident - PGY-2 04/26/2012 7:37 AM

## 2012-04-26 NOTE — Progress Notes (Signed)
  Subjective:    Patient ID: [name], female    DOB: 02-Sep-1975, 36 y.o.   MRN: [id number]  HPI    Review of Systems     Objective:   Physical Exam        Assessment & Plan:  Cipro HC changed to Ciprodex per [facility] Medicaid formulary

## 2012-04-27 ENCOUNTER — Telehealth: Payer: Self-pay | Admitting: Family Medicine

## 2012-04-27 MED ORDER — IBUPROFEN 800 MG PO TABS: 800.0000 mg | ORAL_TABLET | Freq: Three times a day (TID) | ORAL | Status: AC | PRN

## 2012-04-27 NOTE — Telephone Encounter (Signed)
Left voicemail will call back in 10   Spoke with her continued pain in ears.  Taking goody powders but not helping.  Is using drops.  No fever or redness or nausea or vomiting   Continue drops and sent in Rx for ibuprofen

## 2012-04-27 NOTE — Telephone Encounter (Signed)
Pt was here yesterday and was given script for ear drops - not helping the pain and wants to know if something can be called in for her. pls advise  [facility] [place]

## 2012-04-27 NOTE — Telephone Encounter (Signed)
Bilateral ear pain, especially at night.  Denies any ear drainage.  States ears are "still clogged up."  Rates pain at 9/10.  Will route note to preceptor for advice and call patient back.  Gaylene Brooks, RN

## 2012-07-05 ENCOUNTER — Telehealth: Payer: Self-pay | Admitting: Family Medicine

## 2012-07-05 DIAGNOSIS — G43009 Migraine without aura, not intractable, without status migrainosus: Secondary | ICD-10-CM | POA: Insufficient documentation

## 2012-07-05 MED ORDER — SUMATRIPTAN SUCCINATE 50 MG PO TABS: ORAL_TABLET | ORAL | Status: DC

## 2012-07-05 NOTE — Telephone Encounter (Signed)
Is having a really bad migraine and cannot get her head off the couch.  Would like to know if she can have something called in for her.  [facility] [place]

## 2012-07-05 NOTE — Telephone Encounter (Signed)
Patient

## 2012-07-05 NOTE — Telephone Encounter (Signed)
Patient informed to try Imitrex.  If headache not resolved, will need to come in tomorrow for evaluation.  Patient verbalized understanding and agreeable to plan.  Gaylene Brooks, RN

## 2012-07-05 NOTE — Telephone Encounter (Signed)
Returned call to patient and left message to call our office back.  Richardson, Jeannette Ann, RN  

## 2012-07-05 NOTE — Telephone Encounter (Addendum)
Patient calling back.  C/o migraine headache since this morning and took ibuprofen without relief.  Has also been taking Topamax.  Rates headache at 10/10.  Wants to know if Dr. Armen Pickup could call in Rx for headache.  Will route note to Dr. Armen Pickup for advice and call patient back.  Gaylene Brooks, RN

## 2012-07-05 NOTE — Telephone Encounter (Signed)
Sent in imitrex. Should relieve headache if it is an acute migraine. If no relief patient should be seen tomorrow.

## 2012-07-19 ENCOUNTER — Telehealth: Payer: Self-pay | Admitting: Family Medicine

## 2012-07-19 NOTE — Telephone Encounter (Signed)
She is feeling weak and bad and wants to know what she can take. She thinks she may be coming down with a cold. She does not think she can wait until Monday for an appointment but she would like to avoid going to the ED. I recommended she go to an Urgent Care tomorrow morning. She is amenable to this plan.

## 2012-07-20 ENCOUNTER — Emergency Department (HOSPITAL_COMMUNITY)
Admission: EM | Admit: 2012-07-20 | Discharge: 2012-07-20 | Disposition: A | Discharge status: Home / Self Care | Payer: Medicaid Other | Source: Home / Self Care | Attending: Family Medicine | Admitting: Family Medicine

## 2012-07-20 ENCOUNTER — Encounter (HOSPITAL_COMMUNITY): Payer: Self-pay | Admitting: Emergency Medicine

## 2012-07-20 DIAGNOSIS — J329 Chronic sinusitis, unspecified: Secondary | ICD-10-CM

## 2012-07-20 LAB — POCT RAPID STREP A: Streptococcus, Group A Screen (Direct): NEGATIVE

## 2012-07-20 MED ORDER — IBUPROFEN 600 MG PO TABS: 600.0000 mg | ORAL_TABLET | Freq: Three times a day (TID) | ORAL | Status: DC | PRN

## 2012-07-20 MED ORDER — AZITHROMYCIN 250 MG PO TABS: 250.0000 mg | ORAL_TABLET | Freq: Every day | ORAL | Status: DC

## 2012-07-20 MED ORDER — PREDNISONE 20 MG PO TABS: ORAL_TABLET | ORAL | Status: DC

## 2012-07-20 MED ORDER — GUAIFENESIN-CODEINE 100-10 MG/5ML PO SYRP: 5.0000 mL | ORAL_SOLUTION | Freq: Three times a day (TID) | ORAL | Status: DC | PRN

## 2012-07-20 NOTE — ED Notes (Signed)
Woke up yesterday feeling weak and sore throat.  Non productive cough.  Reports headache.  Patient states she has taken Nyquil.

## 2012-07-21 NOTE — ED Provider Notes (Signed)
History     CSN: [id number]  Arrival date & time 07/20/12  1250   First MD Initiated Contact with Patient 07/20/12 1552      Chief Complaint  Patient presents with  . URI    (Consider location/radiation/quality/duration/timing/severity/associated sxs/prior treatment) HPI Comments: 36 y/o female with h/o migraines and allergic rhinitis here c/o nasal congestion and rhinorrhea for more than 1 week. Since yesterday also feeling tired and started to develop sore throat and productive cough of yellow green sputum. Cannot breath through her nose, also having abundant green nasal discharge and sinus pain and headaches. Has had chills but denies fever. Reports decreased appetite and generalized body aches. Taking Nyquil with no significant relief.    History reviewed. No pertinent past medical history.  History reviewed. No pertinent past surgical history.  History reviewed. No pertinent family history.  History  Substance Use Topics  . Smoking status: Current Every Day Smoker -- 1.0 packs/day for 19 years    Types: Cigarettes  . Smokeless tobacco: Not on file     Comment: down to 0.5 PPD  . Alcohol Use: No    OB History    Grav Para Term Preterm Abortions TAB SAB Ect Mult Living                  Review of Systems  Constitutional: Positive for chills, appetite change and fatigue. Negative for fever.  HENT: Positive for ear pain, congestion, sore throat, rhinorrhea, sneezing, postnasal drip and sinus pressure. Negative for trouble swallowing, neck pain and voice change.   Eyes: Negative for discharge.  Respiratory: Positive for cough. Negative for shortness of breath and wheezing.   Cardiovascular: Negative for chest pain.  Gastrointestinal: Negative for nausea, vomiting, abdominal pain and diarrhea.  Skin: Negative for rash.  All other systems reviewed and are negative.    Allergies  Aripiprazole; Escitalopram oxalate; and Latex  Home Medications   Current Outpatient  Rx  Name  Route  Sig  Dispense  Refill  . AMITRIPTYLINE HCL 25 MG PO TABS   Oral   Take 1 tablet (25 mg total) by mouth at bedtime.   30 tablet   6   . CETIRIZINE HCL 10 MG PO TABS      TAKE 1 TABLET BY MOUTH EVERY DAY   30 tablet   1   . FLUTICASONE PROPIONATE 50 MCG/ACT NA SUSP   Nasal   Place 1 spray into the nose 2 (two) times daily.   16 g   1   . SUMATRIPTAN SUCCINATE 50 MG PO TABS      Take once for headache, may repeat in 2 hrs if headache persist   2 tablet   0   . AZITHROMYCIN 250 MG PO TABS   Oral   Take 1 tablet (250 mg total) by mouth daily.   6 tablet   0     2 tabs by mouth on day one then one tablet daily f ...   . GUAIFENESIN-CODEINE 100-10 MG/5ML PO SYRP   Oral   Take 5 mLs by mouth 3 (three) times daily as needed for cough.   120 mL   0   . IBUPROFEN 600 MG PO TABS   Oral   Take 1 tablet (600 mg total) by mouth every 8 (eight) hours as needed for pain or fever.   20 tablet   0   . PREDNISONE 20 MG PO TABS      2 tablets by mouth  daily for 5 days   10 tablet   0   . TOPIRAMATE 25 MG PO TABS   Oral   Take 1 tablet (25 mg total) by mouth 2 (two) times daily.   60 tablet   11     BP 135/78  Pulse 74  Temp 99 F (37.2 C) (Oral)  Resp 20  SpO2 100%  Physical Exam  Nursing note and vitals reviewed. Constitutional: She is oriented to person, place, and time. She appears well-developed and well-nourished. No distress.  HENT:  Head: Normocephalic and atraumatic.       Nasal Congestion with significant erythema and swelling of nasal turbinates, white rhinorrhea. Sinus tenderness. Pharyngeal erythema no exudates. No uvula deviation. No trismus.post nasal drip. TM's with increased vascular markings and some dullness bilaterally no swelling or bulging impress clear fluid behind bilaterally.   Eyes: Conjunctivae normal are normal. Pupils are equal, round, and reactive to light. Right eye exhibits no discharge. Left eye exhibits no  discharge.  Neck: Neck supple.  Cardiovascular: Normal rate, regular rhythm and normal heart sounds.   Pulmonary/Chest: Effort normal and breath sounds normal. No respiratory distress. She has no wheezes. She has no rales. She exhibits no tenderness.  Lymphadenopathy:    She has no cervical adenopathy.  Neurological: She is alert and oriented to person, place, and time.  Skin: No rash noted. She is not diaphoretic.    ED Course  Procedures (including critical care time)   Labs Reviewed  POCT RAPID STREP A ([facility] CARE ONLY)  LAB REPORT - SCANNED   No results found.   1. Rhinosinusitis       MDM  Negative strep test, treated with prednisone, azithromycin, ibuprofen, guaifenesin w/codein. Supportive care and red flags that should prompt patients return to medical attention discussed and provided in writing.         Sharin Grave, MD 07/21/12 2215

## 2012-08-16 ENCOUNTER — Other Ambulatory Visit: Payer: Self-pay | Admitting: Family Medicine

## 2012-08-17 ENCOUNTER — Telehealth: Payer: Self-pay | Admitting: Family Medicine

## 2012-08-17 NOTE — Telephone Encounter (Signed)
Patient calling the emergency line because of URI symptoms and temperature of 100. She states that she started feeling body aches yesterday and increased fatigue. She also has been having congestion, rhinorrhea and sneezing. She denies any chest pain, any shortness of breath or wheezing. She took nyquil which helped a little. She has been able to take fluids. No abdominal pain, no diarrhea.  Patient likely has upper respiratory viral infection. Recommended that she continue symptomatic treatment with nyquil as shown on box. Red flags for return to ED or urgent care reviewed: decreased po intake, shortness of breath, chest pain... Patient expressed understanding.   Marena Chancy, PGY-2 [name]

## 2012-10-02 ENCOUNTER — Encounter: Payer: Self-pay | Admitting: Family Medicine

## 2012-10-02 ENCOUNTER — Ambulatory Visit (INDEPENDENT_AMBULATORY_CARE_PROVIDER_SITE_OTHER): Payer: Medicaid Other | Admitting: Family Medicine

## 2012-10-02 VITALS — BP 133/80 | HR 94 | Ht 69.5 in | Wt 225.0 lb

## 2012-10-02 DIAGNOSIS — M549 Dorsalgia, unspecified: Secondary | ICD-10-CM

## 2012-10-02 DIAGNOSIS — G8929 Other chronic pain: Secondary | ICD-10-CM | POA: Insufficient documentation

## 2012-10-02 MED ORDER — CYCLOBENZAPRINE HCL 10 MG PO TABS: 10.0000 mg | ORAL_TABLET | Freq: Every evening | ORAL | Status: DC | PRN

## 2012-10-02 MED ORDER — TRAMADOL HCL 50 MG PO TABS: 100.0000 mg | ORAL_TABLET | Freq: Three times a day (TID) | ORAL | Status: DC | PRN

## 2012-10-02 NOTE — Progress Notes (Signed)
  Subjective:    Patient ID: [name], female    DOB: March 02, 1976, 37 y.o.   MRN: [id number]  HPI  1.  Back pain:  Chronic for past 8 years or so.  Having worsening past several weeks.  Mostly present when lies on her back, stiffness that resolves with getting up and moving around.  Describes as dull aching pain in bilateral lumbar region.  Occasionally pain radiates to Right leg in non-dermatomal distribution but denies paresthesias, LE weakness.  Taking Velta Addison powder's with only minimal relief of pain.  Never takes these more than twice a day.    No dysuria, hematuria, urinary frequency, radiation of pain to legs, motor weakness, decreased sensation, or headaches.  No fevers or chills.  No bladder/bowel incontinence or saddle anesthesia.     Review of Systems See HPI above for review of systems.       Objective:   Physical Exam Gen:  Alert, cooperative patient who appears stated age in no acute distress.  Vital signs reviewed. Back:  Normal skin, Spine with normal alignment and no deformity.  No tenderness to vertebral process palpation.  Paraspinous muscles are tender BL lumbar region but without spasm.   Range of motion is full at neck and lumbar sacral regions.  Straight leg raise is negative for back pain BL Neuro:  Sensation and motor function 5/5 bilateral lower extremities.  Patellar and Achilles  DTR's +1 patellar BL         Assessment & Plan:

## 2012-10-02 NOTE — Patient Instructions (Addendum)
Keep up with the walking as much as possible.  Weight control is the best thing to help with back pain.  Try the Flexeril at night for relief.  Don't drive with it.  Take the Tramadol for pain 1-2 pills for relief every 8 hours if you need it.  Check back in with your regular doctor in about 2 weeks.  Don't wait if it's worsening.

## 2012-10-02 NOTE — Assessment & Plan Note (Signed)
Sub-acute worsening. MRI from 2009 shows facet arthropathy Has tried PT before in past, not for several years.   Tramadol/Flexeril for relief. Heat/massage No red flags on exam.   Discussed that weight control is best thing for back pain.   FU in 2 weeks with PCP to assess for improvement.  Recommend another trial of PT if pain continues, also possible nutrition referral for help with weight loss.

## 2012-10-21 ENCOUNTER — Telehealth: Payer: Self-pay | Admitting: Family Medicine

## 2012-10-21 NOTE — Telephone Encounter (Signed)
Patient called about burning sensation "inside" her urethra when she urinates. Feels better when not urinating. Never felt that before. Denies fever, chills, back/abdominal pain, emesis, lesions, bumps. I advise her to call for appt in am, she might have a UTI requiring antibiotic treatment. Also advised if she gets acutely worse or violently ill to go to the ER tonight, otherwise call for clinic appointment.

## 2012-10-22 ENCOUNTER — Encounter: Payer: Self-pay | Admitting: Family Medicine

## 2012-10-22 ENCOUNTER — Ambulatory Visit (INDEPENDENT_AMBULATORY_CARE_PROVIDER_SITE_OTHER): Payer: Medicaid Other | Admitting: Family Medicine

## 2012-10-22 VITALS — BP 154/93 | HR 82 | Temp 99.1°F | Ht 69.5 in | Wt 223.0 lb

## 2012-10-22 DIAGNOSIS — R300 Dysuria: Secondary | ICD-10-CM

## 2012-10-22 DIAGNOSIS — N390 Urinary tract infection, site not specified: Secondary | ICD-10-CM | POA: Insufficient documentation

## 2012-10-22 LAB — POCT URINALYSIS DIPSTICK
Bilirubin, UA: NEGATIVE
Blood, UA: NEGATIVE
Ketones, UA: NEGATIVE
Protein, UA: NEGATIVE
Spec Grav, UA: 1.02
pH, UA: 6

## 2012-10-22 MED ORDER — CEPHALEXIN 500 MG PO CAPS: 500.0000 mg | ORAL_CAPSULE | Freq: Two times a day (BID) | ORAL | Status: DC

## 2012-10-22 NOTE — Assessment & Plan Note (Addendum)
Treatment with Keflex x 7 days. Treat based on symptoms despite negative U/A.  Send for culture. Call or return to clinic prn if these symptoms worsen or fail to improve as anticipated, or if she begins to have any back pain, fevers, or chills.

## 2012-10-22 NOTE — Progress Notes (Signed)
Patient ID: [name], female   DOB: 08-Dec-1975, 37 y.o.   MRN: [id number] SUBJECTIVE: [name] is a 37 y.o. female who complains of urinary frequency, urgency and dysuria x 3 days since cessation of her menses, without flank pain, fever, chills, or abnormal vaginal discharge or bleeding.  No vaginal discharge  OBJECTIVE:  Gen:  Appears well, in no apparent distress.  Vital signs are normal.  Cardiac:  Regular rate and rhythm with Grade II/VI SEM noted Pulm:  Clear to auscultation bilaterally with good air movement.  No wheezes or rales noted.   Abdomen: The abdomen is soft without tenderness, guarding, mass, rebound or organomegaly. No CVA tenderness or inguinal adenopathy noted.

## 2012-10-22 NOTE — Patient Instructions (Signed)
We will call you if we have to change the antibiotics.    Let us know if you start having any fevers/chills/ or not getting better with the medicine.    Urinary Tract Infection A urinary tract infection (UTI) is often caused by a germ (bacteria). A UTI is usually helped with medicine (antibiotics) that kills germs. Take all the medicine until it is gone. Do this even if you are feeling better. You are usually better in 7 to 10 days. HOME CARE   Drink enough water and fluids to keep your pee (urine) clear or pale yellow. Drink:  Cranberry juice.  Water.  Avoid:  Caffeine.  Tea.  Bubbly (carbonated) drinks.  Alcohol.  Only take medicine as told by your doctor.  To prevent further infections:  Pee often.  After pooping (bowel movement), women should wipe from front to back. Use each tissue only once.  Pee before and after having sex (intercourse). Ask your doctor when your test results will be ready. Make sure you follow up and get your test results.  GET HELP RIGHT AWAY IF:   There is very bad back pain or lower belly (abdominal) pain.  You get the chills.  You have a fever.  Your baby is older than 3 months with a rectal temperature of 102 F (38.9 C) or higher.  Your baby is 78 months old or younger with a rectal temperature of 100.4 F (38 C) or higher.  You feel sick to your stomach (nauseous) or throw up (vomit).  There is continued burning with peeing.  Your problems are not better in 3 days. Return sooner if you are getting worse. MAKE SURE YOU:   Understand these instructions.  Will watch your condition.  Will get help right away if you are not doing well or get worse. Document Released: 01/25/2008 Document Revised: 10/31/2011 Document Reviewed: 01/25/2008 [facility] Patient Information 2013 [place], [place].

## 2012-12-28 ENCOUNTER — Telehealth: Payer: Self-pay | Admitting: Family Medicine

## 2012-12-28 NOTE — Telephone Encounter (Signed)
Advised pt that she cant increase the zyrtec (only 10mg  daily).  Advised to try benadryl. OTC allergy eye drops for itchy watery eyes as pt expresses that nasal spray does not help.  Pt agreeable. Sari Cogan, Maryjo [place]

## 2012-12-28 NOTE — Telephone Encounter (Signed)
Patient is on allergy meds, she is taking generic of Zyrtec, but her dose isn't working for her.  This allergy season is awful.  She is hoping that increasing the dose might help.

## 2013-01-02 ENCOUNTER — Telehealth: Payer: Self-pay | Admitting: Family Medicine

## 2013-01-02 NOTE — Telephone Encounter (Signed)
Pt states " I believe that I have hemoroids". Advised to drink lots of fluids, increase fiber. Could take stool softeners to decrease straining and use OTC fiber supplement. Pt reports that she has been eating more fiber and increasing water intake. Advised to use TUCK pads or hemoroid cream for discomfort and warm soaks in bath. Pt verbalized understanding, encouraged to call if further problems or worsening condition. Wyatt Haste, RN-BSN

## 2013-01-02 NOTE — Telephone Encounter (Signed)
Pt is having problems with her bottom and wants advise from nurse- could be hemm. But has never had them

## 2013-01-16 ENCOUNTER — Other Ambulatory Visit: Payer: Self-pay | Admitting: Family Medicine

## 2013-01-16 NOTE — Telephone Encounter (Signed)
Requested Prescriptions   Pending Prescriptions Disp Refills  . traMADol (ULTRAM) 50 MG tablet [Pharmacy Med Name: TRAMADOL HCL 50 MG TABLET] 30 tablet 1    Sig: TAKE 2 TABLETS BY MOUTH EVERY 8 HOURS AS NEEDED FOR PAIN  . fluticasone (FLONASE) 50 MCG/ACT nasal spray 16 g 1  ;

## 2013-01-17 MED ORDER — FLUTICASONE PROPIONATE 50 MCG/ACT NA SUSP: NASAL | Status: DC

## 2013-02-07 ENCOUNTER — Telehealth: Payer: Self-pay | Admitting: Family Medicine

## 2013-02-07 NOTE — Telephone Encounter (Signed)
Pt told needs OV. Pt verbalized understanding.  Tashonda Pinkus, Darlyne Russian, CMA

## 2013-02-07 NOTE — Telephone Encounter (Signed)
Patient states that Tramadol is no longer working for a back pain. She is in extreme pain. Patient would like to get a script for a back brace.

## 2013-02-08 NOTE — Telephone Encounter (Signed)
I agree. Will address at visit.  Will likely start NSAID, need to check Cr first.

## 2013-02-15 ENCOUNTER — Ambulatory Visit (INDEPENDENT_AMBULATORY_CARE_PROVIDER_SITE_OTHER): Payer: Medicaid Other | Admitting: Family Medicine

## 2013-02-15 ENCOUNTER — Encounter: Payer: Self-pay | Admitting: Family Medicine

## 2013-02-15 VITALS — BP 128/85 | HR 90 | Ht 69.0 in | Wt 219.0 lb

## 2013-02-15 DIAGNOSIS — M549 Dorsalgia, unspecified: Secondary | ICD-10-CM

## 2013-02-15 DIAGNOSIS — G43009 Migraine without aura, not intractable, without status migrainosus: Secondary | ICD-10-CM

## 2013-02-15 MED ORDER — PROMETHAZINE HCL 12.5 MG PO TABS
25.0000 mg | ORAL_TABLET | Freq: Once | ORAL | Status: AC
  Administered 2013-02-15: 25 mg via ORAL

## 2013-02-15 MED ORDER — METHYLPREDNISOLONE ACETATE 40 MG/ML IJ SUSP
40.0000 mg | Freq: Once | INTRAMUSCULAR | Status: AC
  Administered 2013-02-15: 40 mg via INTRAMUSCULAR

## 2013-02-15 MED ORDER — DIPHENHYDRAMINE HCL 25 MG PO CAPS: 25.0000 mg | ORAL_CAPSULE | Freq: Once | ORAL | Status: DC

## 2013-02-15 MED ORDER — AMITRIPTYLINE HCL 50 MG PO TABS: 50.0000 mg | ORAL_TABLET | Freq: Every day | ORAL | Status: DC

## 2013-02-15 MED ORDER — DIPHENHYDRAMINE HCL 50 MG PO CAPS
50.0000 mg | ORAL_CAPSULE | Freq: Once | ORAL | Status: AC
  Administered 2013-02-15: 50 mg via ORAL

## 2013-02-15 NOTE — Progress Notes (Signed)
Subjective:     Patient ID: [name], female   DOB: 04-02-1976, 37 y.o.   MRN: [id number]  HPI 37 year old female presents for follow visit to discuss the following  #1 headache: Patient has had headache for the past 4 days. This was started on the right side is now left-sided. The headache has not been bilateral. Patient has tried Aleve and tramadol for her headache without relief. She has not tried Imitrex. She has a history of migraine headaches and is compliant with Elavil for headache prevention. Associated symptoms include nausea without vomiting. There is no associated fever, focal weakness or vision changes.  #2 chronic low back pain: Patient with chronic low back pain for the last 9 years since the birth of her son. The pain is midline and lumbar does not radiate. Tramadol does not help her back pain. The pain is moderate to severe it is worse with prolonged sitting or lying. The pain is improved with activity.  Review of Systems As per HPI     Objective:   Physical Exam BP 128/85  Pulse 90  Ht 5\' 9"  (1.753 m)  Wt 219 lb (99.338 kg)  BMI 32.33 kg/m2 General appearance: alert, cooperative and no distress Neurologic: Alert and oriented X 3, normal strength and tone. Normal symmetric reflexes. Normal coordination and gait Back Exam: Back: Normal Curvature, no deformities or CVA tenderness  Paraspinal Tenderness: b/l L4  LE Strength 5/5  LE Sensation: in tact  LE Reflexes 2+ and symmetric  Straight leg raise: negative   Gave migraine cocktail of phenergan 25 mg  PO, Depo Medrol 40 mg IM, and benardyl 50 mg PO.      Assessment and Plan:

## 2013-02-15 NOTE — Assessment & Plan Note (Signed)
A: chronic. No red flags.  P: Recommended weight loss Titrate up on nightly elavil Advised that back brace would not help Patient has already been to PT.

## 2013-02-15 NOTE — Patient Instructions (Addendum)
Kiondra,  Thank you for coming in today. Your exam is normal.  1. Chronic migraine x 4 days: headache cocktail to abort migraine. The help prevent recurrent migraine increase elavil to  50 mg nightly, we can slowly go up further if you tolerate this dose.  If you get a migraine take Imitrex right away to try to prevent it from getting bad and lasting a long time like today. Take one dose and repeat in 2 hrs if needed. No more than two doses in one week.   2. Chronic back pain: increasing elavil will also help with this. Weight loss will help as well.   F/u in 1 month  Dr. Armen Pickup

## 2013-02-15 NOTE — Assessment & Plan Note (Signed)
A: patient with chronic migraine x 4 days. P: Abort migraine with headache cocktail Prevent migraine with elavil, increase dose to 50 mg q HS.

## 2013-02-16 ENCOUNTER — Other Ambulatory Visit: Payer: Self-pay | Admitting: Family Medicine

## 2013-02-19 ENCOUNTER — Other Ambulatory Visit: Payer: Self-pay | Admitting: Family Medicine

## 2013-02-20 ENCOUNTER — Other Ambulatory Visit: Payer: Self-pay | Admitting: Family Medicine

## 2013-02-20 NOTE — Telephone Encounter (Signed)
I am newly this patient's PCP. Received refill request for topiramate, but per Dr. Alisa Graff most recent note, it appears patient is no longer on this for migraines, and per last rx for this, it was last prescribed in 10/2010 for 1 year and then not re-prescribed, so will discontinue.  Leona Singleton, MD 02/20/2013 12:53 PM

## 2013-02-20 NOTE — Telephone Encounter (Signed)
Last rx for topiramate was written 10/2010 and ran out 10/2011. What did patient use for the last year? Will re-rx 3 months worth but want to see patient in clinic to clarify and follow-up.

## 2013-02-24 ENCOUNTER — Telehealth: Payer: Self-pay | Admitting: Family Medicine

## 2013-02-24 NOTE — Telephone Encounter (Signed)
Pt calling after hours line for pruritis near shaving area.  First noticed yesterday and hasn't tried anything for this.  No associated fever, edema around the area.  Recommended starting benadryl PRN for itching and making an appointment if it continues.  Twana First Paulina Fusi, DO of [facility] 02/24/2013, 3:01 PM

## 2013-03-26 ENCOUNTER — Telehealth: Payer: Self-pay | Admitting: Family Medicine

## 2013-03-26 NOTE — Telephone Encounter (Signed)
Pt reports sinus pain and pressure, coughing and itching - pt reports taking daily allergy meds, recommended Mucinex and encouraged fluids , daily allergy meds - call back if no improvement for appointment-ew

## 2013-03-26 NOTE — Telephone Encounter (Signed)
Patient is calling wanting to speak to the nurse about some sinus symptoms she is having.

## 2013-03-29 ENCOUNTER — Encounter (HOSPITAL_COMMUNITY): Payer: Self-pay | Admitting: Emergency Medicine

## 2013-03-29 ENCOUNTER — Emergency Department (HOSPITAL_COMMUNITY)
Admission: EM | Admit: 2013-03-29 | Discharge: 2013-03-29 | Disposition: A | Discharge status: Home / Self Care | Payer: Medicaid Other | Source: Home / Self Care

## 2013-03-29 DIAGNOSIS — J329 Chronic sinusitis, unspecified: Secondary | ICD-10-CM

## 2013-03-29 LAB — POCT RAPID STREP A: Streptococcus, Group A Screen (Direct): NEGATIVE

## 2013-03-29 MED ORDER — IPRATROPIUM BROMIDE 0.03 % NA SOLN: 2.0000 | Freq: Two times a day (BID) | NASAL | Status: DC

## 2013-03-29 MED ORDER — CEPHALEXIN 500 MG PO CAPS: 500.0000 mg | ORAL_CAPSULE | Freq: Four times a day (QID) | ORAL | Status: DC

## 2013-03-29 MED ORDER — GUAIFENESIN-CODEINE 100-10 MG/5ML PO SOLN: 5.0000 mL | Freq: Three times a day (TID) | ORAL | Status: DC | PRN

## 2013-03-29 NOTE — ED Notes (Signed)
C/o facial sinus.  States she has pressure and drainage. States she has had sinus problems before.  OTC medications taken but no relief.  States cough, sore throat and drainage.

## 2013-03-29 NOTE — ED Provider Notes (Signed)
[name] is a 37 y.o. female who presents to Urgent Care today for sinus congestion pain cough and sore throat present for the last one week. No trouble breathing fevers chills chest pain or palpitations. She has tried multiple over-the-counter cold and cough medications, as well as Zyrtec and Flonase nasal spray which have not helped. Her symptoms are consistent with prior episodes of bacterial sinusitis. She notes the Keflex usually works for this. She denies any nausea vomiting or diarrhea. The pain is moderate and does not radiate.    PMH reviewed. Migraines History  Substance Use Topics  . Smoking status: Current Every Day Smoker -- 1.00 packs/day for 19 years    Types: Cigarettes  . Smokeless tobacco: Not on file     Comment: down to 0.5 PPD  . Alcohol Use: No   ROS as above Medications reviewed. No current facility-administered medications for this encounter.   Current Outpatient Prescriptions  Medication Sig Dispense Refill  . amitriptyline (ELAVIL) 50 MG tablet Take 1 tablet (50 mg total) by mouth at bedtime.  30 tablet  1  . cetirizine (ZYRTEC) 10 MG tablet TAKE 1 TABLET BY MOUTH EVERY DAY  30 tablet  1  . cyclobenzaprine (FLEXERIL) 10 MG tablet Take 1 tablet (10 mg total) by mouth at bedtime as needed for muscle spasms.  30 tablet  1  . fluticasone (FLONASE) 50 MCG/ACT nasal spray USE 1 SPRAY INTO EACH NOSTRIL 2 TIMES DAILY.  16 g  1  . SUMAtriptan (IMITREX) 50 MG tablet TAKE ONE FOR HEADACHE, MAY REPEAT IN 2 HRS IF HEADACHE PERSIST  2 tablet  0  . traMADol (ULTRAM) 50 MG tablet TAKE 2 TABLETS BY MOUTH EVERY 8 HOURS AS NEEDED FOR PAIN  30 tablet  1  . cephALEXin (KEFLEX) 500 MG capsule Take 1 capsule (500 mg total) by mouth 4 (four) times daily.  28 capsule  0  . guaiFENesin-codeine 100-10 MG/5ML syrup Take 5 mLs by mouth 3 (three) times daily as needed for cough.  120 mL  0  . ipratropium (ATROVENT) 0.03 % nasal spray Place 2 sprays into the nose every 12 (twelve)  hours.  30 mL  12    Exam:  BP 129/86  Pulse 80  Temp(Src) 98.5 F (36.9 C) (Oral)  Resp 16  SpO2 100%  LMP 03/03/2013 Gen: Well NAD HEENT: EOMI,  MMM, normal tympanic membranes bilaterally posterior pharynx erythematous with cobblestoning. Nasal turbinates erythematous. Tender palpation bilateral maxillary sinus.  Lungs: CTABL Nl WOB Heart: RRR no MRG Abd: NABS, NT, ND Exts: Non edematous BL  LE, warm and well perfused.   No results found for this or any previous visit (from the past 24 hour(s)). No results found.  Assessment and Plan: 37 y.o. female with likely sinusitis.  Plan to treat with Atrovent nasal spray as well as over-the-counter pain medications. We'll use Keflex as well for antibiotics. Will use codeine containing cough medication at night as needed for bothersome cough.  Followup with primary care provider as needed.  Discussed warning signs or symptoms. Please see discharge instructions. Patient expresses understanding.      Rodolph Bong, MD 03/29/13 [phone]

## 2013-03-31 LAB — CULTURE, GROUP A STREP

## 2013-04-11 ENCOUNTER — Telehealth: Payer: Self-pay | Admitting: *Deleted

## 2013-04-11 NOTE — Telephone Encounter (Signed)
Pt reports migraine and has no refill for Imitrex. Can a refill be called in? She states that this does give her relief. Wyatt Haste, RN-BSN

## 2013-04-15 ENCOUNTER — Other Ambulatory Visit: Payer: Self-pay | Admitting: *Deleted

## 2013-04-15 MED ORDER — SUMATRIPTAN SUCCINATE 50 MG PO TABS: ORAL_TABLET | ORAL | Status: DC

## 2013-04-15 NOTE — Telephone Encounter (Signed)
Called listed primary phone number that stated it was phone of pt, and left message that I have sent refill to her pharmacy [facility] on [address].  She should come to clinic for follow-up if not relieved.   Leona Singleton, MD

## 2013-04-15 NOTE — Telephone Encounter (Signed)
Pt reports itching in the vaginal area and inner thighs. No relief from OTC cream -appointment made for Wednesday. Wyatt Haste, RN-BSN

## 2013-04-17 ENCOUNTER — Ambulatory Visit (INDEPENDENT_AMBULATORY_CARE_PROVIDER_SITE_OTHER): Payer: Medicaid Other | Admitting: Family Medicine

## 2013-04-17 ENCOUNTER — Encounter: Payer: Self-pay | Admitting: Family Medicine

## 2013-04-17 VITALS — BP 130/93 | HR 86 | Ht 68.0 in | Wt 214.0 lb

## 2013-04-17 DIAGNOSIS — B373 Candidiasis of vulva and vagina: Secondary | ICD-10-CM

## 2013-04-17 MED ORDER — FLUCONAZOLE 150 MG PO TABS
150.0000 mg | ORAL_TABLET | Freq: Once | ORAL | Status: DC
Start: 2013-04-17 — End: 2013-05-27

## 2013-04-17 MED ORDER — CLOTRIMAZOLE 1 % EX CREA: TOPICAL_CREAM | Freq: Two times a day (BID) | CUTANEOUS | Status: DC

## 2013-04-17 NOTE — Progress Notes (Signed)
Patient ID: [name], female   DOB: [birth year], 37 y.o.   MRN: [id number] Subjective:   CC: vaginal itching  HPI:   1. Vaginal itching - Pt reports 3 weeks of "raw" itchy skin that is peeling around vagina. She noticed a line of demarcation between normal and abnormal skin. She thinks this is because she shaved without soap, though that has never caused problems before. She tried hydrocortisone cream which did not help. Denies fevers/chills/vaginal itching/discharge that is abnormal/abdominal pain. Has not sex in a while so unsure if dyspareunia. Urine irritates skin around vagina.  Review of Systems - Per HPI.   SH: Has not been sexually active recently.    Objective:  Physical Exam BP 130/93  Pulse 86  Ht 5\' 8"  (1.727 m)  Wt 214 lb (97.07 kg)  BMI 32.55 kg/m2  LMP 03/03/2013 GEN: NAD HEENT: Atraumatic, normocephalic, neck supple, EOMI, sclera clear  PULM: normal effort ABD: Soft, nondistended SKIN: No rash or cyanosis; warm and well-perfused other than below GU: External vagina appears WNL except inguinal region surriounding vagina with sharply demarcated area of slight scaling and irritation and mildly hyperpigmented, no exudate/discharge, no lesions; immediately visible vaginal mucosa upon opening labia minora show no discharge, irritation, or lesion. EXTR: No lower extremity edema or calf tenderness PSYCH: Mood and affect euthymic, normal rate and volume of speech NEURO: Awake, alert, no focal deficits grossly, normal speech  Assessment:     [name] is a 37 y.o. female here for vaginal irritation/itching    Plan:     # See problem list for problem-specific plans.

## 2013-04-17 NOTE — Assessment & Plan Note (Signed)
Yeast infection on labia majora and in inguinal regions. No fevers/chills/discharge/abdominal pain. - Diflucan PO x 1, clotrimazole cream 5-7 days rx'ed - Follow up if worse or no improvement in 3-4 days

## 2013-04-17 NOTE — Patient Instructions (Addendum)
It looks like you have a yeast infection that is affecting the skin around your vagina. Take diflucan 1 tablet total and Clotrimazole cream twice daily x 5-7 days and this should help with symptoms. If you are not better in 3-4 days, come back.  Monilial Vaginitis Vaginitis in a soreness, swelling and redness (inflammation) of the vagina and vulva. Monilial vaginitis is not a sexually transmitted infection. CAUSES  Yeast vaginitis is caused by yeast (candida) that is normally found in your vagina. With a yeast infection, the candida has overgrown in number to a point that upsets the chemical balance. SYMPTOMS   White, thick vaginal discharge.  Swelling, itching, redness and irritation of the vagina and possibly the lips of the vagina (vulva).  Burning or painful urination.  Painful intercourse. DIAGNOSIS  Things that may contribute to monilial vaginitis are:  Postmenopausal and virginal states.  Pregnancy.  Infections.  Being tired, sick or stressed, especially if you had monilial vaginitis in the past.  Diabetes. Good control will help lower the chance.  Birth control pills.  Tight fitting garments.  Using bubble bath, feminine sprays, douches or deodorant tampons.  Taking certain medications that kill germs (antibiotics).  Sporadic recurrence can occur if you become ill. TREATMENT  Your caregiver will give you medication.  There are several kinds of anti monilial vaginal creams and suppositories specific for monilial vaginitis. For recurrent yeast infections, use a suppository or cream in the vagina 2 times a week, or as directed.  Anti-monilial or steroid cream for the itching or irritation of the vulva may also be used. Get your caregiver's permission.  Painting the vagina with methylene blue solution may help if the monilial cream does not work.  Eating yogurt may help prevent monilial vaginitis. HOME CARE INSTRUCTIONS   Finish all medication as  prescribed.  Do not have sex until treatment is completed or after your caregiver tells you it is okay.  Take warm sitz baths.  Do not douche.  Do not use tampons, especially scented ones.  Wear cotton underwear.  Avoid tight pants and panty hose.  Tell your sexual partner that you have a yeast infection. They should go to their caregiver if they have symptoms such as mild rash or itching.  Your sexual partner should be treated as well if your infection is difficult to eliminate.  Practice safer sex. Use condoms.  Some vaginal medications cause latex condoms to fail. Vaginal medications that harm condoms are:  Cleocin cream.  Butoconazole (Femstat).  Terconazole (Terazol) vaginal suppository.  Miconazole (Monistat) (may be purchased over the counter). SEEK MEDICAL CARE IF:   You have a temperature by mouth above 102 F (38.9 C).  The infection is getting worse after 2 days of treatment.  The infection is not getting better after 3 days of treatment.  You develop blisters in or around your vagina.  You develop vaginal bleeding, and it is not your menstrual period.  You have pain when you urinate.  You develop intestinal problems.  You have pain with sexual intercourse. Document Released: 05/18/2005 Document Revised: 10/31/2011 Document Reviewed: 01/30/2009 [facility] Patient Information 2014 [place], [place].  Leona Singleton, MD

## 2013-05-01 ENCOUNTER — Telehealth: Payer: Self-pay | Admitting: Family Medicine

## 2013-05-01 NOTE — Telephone Encounter (Signed)
Pain in in abdomen and no longer in chest.  Took Pepto bismol since yesterday without relief.  Made self vomit yesterday and felt some relief.  Describes pain as "burning."  Has had reflux med in past.  Also, had positive H.pylor in past, but has been "a long time ago."  Feels like food is stuck at top.  Patient requesting an appt for tomorrow.  Appt scheduled.  Patient informed that she can go to urgent care or ED if pain worsens andunable to wait until tomorrow.  Patient agreeable.  Gaylene Brooks, RN

## 2013-05-01 NOTE — Telephone Encounter (Signed)
Patient calls stating her has been having acid reflux symptoms for the past 2 days, has taken OTC meds with no help. Patient would like to speak to a nurse to see if there is anything else that can be done.

## 2013-05-02 ENCOUNTER — Encounter: Payer: Self-pay | Admitting: Family Medicine

## 2013-05-02 ENCOUNTER — Ambulatory Visit (INDEPENDENT_AMBULATORY_CARE_PROVIDER_SITE_OTHER): Payer: Medicaid Other | Admitting: Family Medicine

## 2013-05-02 VITALS — BP 126/84 | HR 77 | Ht 69.0 in | Wt 217.0 lb

## 2013-05-02 DIAGNOSIS — K219 Gastro-esophageal reflux disease without esophagitis: Secondary | ICD-10-CM

## 2013-05-02 DIAGNOSIS — R1013 Epigastric pain: Secondary | ICD-10-CM

## 2013-05-02 LAB — COMPREHENSIVE METABOLIC PANEL
AST: 16 U/L (ref 0–37)
Albumin: 4.2 g/dL (ref 3.5–5.2)
Alkaline Phosphatase: 47 U/L (ref 39–117)
BUN: 8 mg/dL (ref 6–23)
Glucose, Bld: 92 mg/dL (ref 70–99)
Potassium: 4.2 mEq/L (ref 3.5–5.3)
Sodium: 136 mEq/L (ref 135–145)
Total Bilirubin: 0.4 mg/dL (ref 0.3–1.2)

## 2013-05-02 LAB — CBC
HCT: 39 % (ref 36.0–46.0)
MCH: 23 pg — ABNORMAL LOW (ref 26.0–34.0)
MCHC: 32.8 g/dL (ref 30.0–36.0)
RDW: 15.4 % (ref 11.5–15.5)

## 2013-05-02 MED ORDER — PANTOPRAZOLE SODIUM 40 MG PO TBEC: 40.0000 mg | DELAYED_RELEASE_TABLET | Freq: Every day | ORAL | Status: DC

## 2013-05-02 MED ORDER — RANITIDINE HCL 150 MG PO CAPS: 150.0000 mg | ORAL_CAPSULE | Freq: Two times a day (BID) | ORAL | Status: DC

## 2013-05-02 NOTE — Assessment & Plan Note (Addendum)
Symptoms likely from gerd given burning sensation with eating. She does have a h/o h-pylori, unfortunately since she had already taken pepto-bysmol, she could not do the in office breath test to test for recurrent H-pylori.  - treat with protonix 40 daily - start zantac for instant relief - return to clinic in 2 weeks for follow up with PCP.  - CBC in setting of dark stools (although likely dark from pepto-bysmol) and dizziness to evaluate for anemia and CMP to confirm normal LFT's.

## 2013-05-02 NOTE — Progress Notes (Signed)
Patient ID: [name]    DOB: [birth year], 37 y.o.   MRN: [id number] --- Subjective:  [name] is a 37 y.o.female with previous h/o H-pylori and GERD who presents with epigastric abdominal pain.  - abdominal pain: feeling of acid in the chest that then moved to the stomach. Has been ongoing for 3-4 days.She has not been able to eat because of the pain. She is drinking fluids. Pain is located in the epigastric area, intermittent, worst with eating. Certain foods make it worst. She drinks a lot of coffee, tomato products and fried foods.  She tried tums which didn't work, then tried [employer] which didn't work either.  She had been on protonix a little while ago which helped. She has since stopped taking it.   She states that she had some abdominal discomfort around her sides, but that she had been constipated and once she took something to help her bowels move better, the discomfort in her sides improved. The epigastric pain has not improved with improved constipation. She states that yesterday her stool was darker than usual. No blood in stool. She reports mild dizziness.   ROS: see HPI Past Medical History: reviewed and updated medications and allergies. Social History: Tobacco: 1 pack per day  Objective: Filed Vitals:   05/02/13 0938  BP: 126/84  Pulse: 77    Physical Examination:   General appearance - alert, well appearing, and in no distress Chest - clear to auscultation, no wheezes, rales or rhonchi, symmetric air entry Heart - normal rate, regular rhythm, normal S1, S2, no murmurs, rubs, clicks or gallops Abdomen - soft, some discomfort in the mid right quadrant, no epigastric tenderness, no rebound, no guarding.

## 2013-05-02 NOTE — Patient Instructions (Addendum)
Follow up in 2 weeks.  Start protonix once a day.  In the immediate setting, you can take ranitidine 150mg  twice a day.      Gastroesophageal Reflux Disease, Adult Gastroesophageal reflux disease (GERD) happens when acid from your stomach flows up into the esophagus. When acid comes in contact with the esophagus, the acid causes soreness (inflammation) in the esophagus. Over time, GERD may create small holes (ulcers) in the lining of the esophagus. CAUSES   Increased body weight. This puts pressure on the stomach, making acid rise from the stomach into the esophagus.  Smoking. This increases acid production in the stomach.  Drinking alcohol. This causes decreased pressure in the lower esophageal sphincter (valve or ring of muscle between the esophagus and stomach), allowing acid from the stomach into the esophagus.  Late evening meals and a full stomach. This increases pressure and acid production in the stomach.  A malformed lower esophageal sphincter. Sometimes, no cause is found. SYMPTOMS   Burning pain in the lower part of the mid-chest behind the breastbone and in the mid-stomach area. This may occur twice a week or more often.  Trouble swallowing.  Sore throat.  Dry cough.  Asthma-like symptoms including chest tightness, shortness of breath, or wheezing. DIAGNOSIS  Your caregiver may be able to diagnose GERD based on your symptoms. In some cases, X-rays and other tests may be done to check for complications or to check the condition of your stomach and esophagus. TREATMENT  Your caregiver may recommend over-the-counter or prescription medicines to help decrease acid production. Ask your caregiver before starting or adding any new medicines.  HOME CARE INSTRUCTIONS   Change the factors that you can control. Ask your caregiver for guidance concerning weight loss, quitting smoking, and alcohol consumption.  Avoid foods and drinks that make your symptoms worse, such  as:  Caffeine or alcoholic drinks.  Chocolate.  Peppermint or mint flavorings.  Garlic and onions.  Spicy foods.  Citrus fruits, such as oranges, lemons, or limes.  Tomato-based foods such as sauce, chili, salsa, and pizza.  Fried and fatty foods.  Avoid lying down for the 3 hours prior to your bedtime or prior to taking a nap.  Eat small, frequent meals instead of large meals.  Wear loose-fitting clothing. Do not wear anything tight around your waist that causes pressure on your stomach.  Raise the head of your bed 6 to 8 inches with wood blocks to help you sleep. Extra pillows will not help.  Only take over-the-counter or prescription medicines for pain, discomfort, or fever as directed by your caregiver.  Do not take aspirin, ibuprofen, or other nonsteroidal anti-inflammatory drugs (NSAIDs). SEEK IMMEDIATE MEDICAL CARE IF:   You have pain in your arms, neck, jaw, teeth, or back.  Your pain increases or changes in intensity or duration.  You develop nausea, vomiting, or sweating (diaphoresis).  You develop shortness of breath, or you faint.  Your vomit is green, yellow, black, or looks like coffee grounds or blood.  Your stool is red, bloody, or black. These symptoms could be signs of other problems, such as heart disease, gastric bleeding, or esophageal bleeding. MAKE SURE YOU:   Understand these instructions.  Will watch your condition.  Will get help right away if you are not doing well or get worse. Document Released: 05/18/2005 Document Revised: 10/31/2011 Document Reviewed: 02/25/2011 [facility] Patient Information 2014 [place], [place].  Diet for Gastroesophageal Reflux Disease, Adult Reflux (acid reflux) is when acid from your stomach  flows up into the esophagus. When acid comes in contact with the esophagus, the acid causes irritation and soreness (inflammation) in the esophagus. When reflux happens often or so severely that it causes damage to the  esophagus, it is called gastroesophageal reflux disease (GERD). Nutrition therapy can help ease the discomfort of GERD. FOODS OR DRINKS TO AVOID OR LIMIT  Smoking or chewing tobacco. Nicotine is one of the most potent stimulants to acid production in the gastrointestinal tract.  Caffeinated and decaffeinated coffee and black tea.  Regular or low-calorie carbonated beverages or energy drinks (caffeine-free carbonated beverages are allowed).   Strong spices, such as black pepper, white pepper, red pepper, cayenne, curry powder, and chili powder.  Peppermint or spearmint.  Chocolate.  High-fat foods, including meats and fried foods. Extra added fats including oils, butter, salad dressings, and nuts. Limit these to less than 8 tsp per day.  Fruits and vegetables if they are not tolerated, such as citrus fruits or tomatoes.  Alcohol.  Any food that seems to aggravate your condition. If you have questions regarding your diet, call your caregiver or a registered dietitian. OTHER THINGS THAT MAY HELP GERD INCLUDE:   Eating your meals slowly, in a relaxed setting.  Eating 5 to 6 small meals per day instead of 3 large meals.  Eliminating food for a period of time if it causes distress.  Not lying down until 3 hours after eating a meal.  Keeping the head of your bed raised 6 to 9 inches (15 to 23 cm) by using a foam wedge or blocks under the legs of the bed. Lying flat may make symptoms worse.  Being physically active. Weight loss may be helpful in reducing reflux in overweight or obese adults.  Wear loose fitting clothing EXAMPLE MEAL PLAN This meal plan is approximately 2,000 calories based on https://www.bernard.org/ meal planning guidelines. Breakfast   cup cooked oatmeal.  1 cup strawberries.  1 cup low-fat milk.  1 oz almonds. Snack  1 cup cucumber slices.  6 oz yogurt (made from low-fat or fat-free milk). Lunch  2 slice whole-wheat bread.  2 oz sliced [place].  2  tsp mayonnaise.  1 cup blueberries.  1 cup snap peas. Snack  6 whole-wheat crackers.  1 oz string cheese. Dinner   cup brown rice.  1 cup mixed veggies.  1 tsp olive oil.  3 oz grilled fish. Document Released: 08/08/2005 Document Revised: 10/31/2011 Document Reviewed: 06/24/2011 [facility] Patient Information 2014 [place], [place].

## 2013-05-03 ENCOUNTER — Telehealth: Payer: Self-pay | Admitting: Family Medicine

## 2013-05-03 NOTE — Telephone Encounter (Signed)
Called patient to let her know of her lab results. CMP was normal. I told her that her WBC was higher than normal. It seems that she had  Elevated WBC count in the last 3-4 years. 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 She has not seen ocology for follow up since then.  I recommended that when she see Dr. Benjamin Stain, she mention this to her to see whether she needs to be referred back to oncology for follow up.  She expressed understanding and agreed with plan.   [name], PGY-3 Family Medicine Resident

## 2013-05-14 ENCOUNTER — Other Ambulatory Visit: Payer: Self-pay | Admitting: Family Medicine

## 2013-05-14 NOTE — Telephone Encounter (Signed)
I am unable to prescribe tramadol electronically. Please let patient know that this can only be prescribed in the office given changes with rules on this medicine. I will need to re-evaluate patient's pain.  Thanks.  Leona Singleton, MD

## 2013-05-15 NOTE — Telephone Encounter (Signed)
Has appt 05/21/13. [name], Maryjo Rochester

## 2013-05-20 NOTE — Telephone Encounter (Signed)
This just came into my inbox again but I cannot refill via e-prescribe and pt coming in tomorrow for appt to discuss refill. Thanks. Leona Singleton, MD

## 2013-05-21 ENCOUNTER — Ambulatory Visit: Payer: Medicaid Other | Admitting: Family Medicine

## 2013-05-22 ENCOUNTER — Telehealth: Payer: Self-pay | Admitting: *Deleted

## 2013-05-22 NOTE — Telephone Encounter (Signed)
[facility] on [address] called in a request for Tramadol 50 Mg for [name], will forward to PCP for request.[name], [name]

## 2013-05-23 NOTE — Telephone Encounter (Signed)
Patient has appt 10/6 with Dr Armen Pickup to discuss need for this. We are no longer doing e-prescribed tramadol.  Leona Singleton, MD

## 2013-05-23 NOTE — Telephone Encounter (Signed)
Pt is aware.  Rowan Pollman,CMA  

## 2013-05-27 ENCOUNTER — Other Ambulatory Visit: Payer: Self-pay | Admitting: Family Medicine

## 2013-05-27 ENCOUNTER — Ambulatory Visit (INDEPENDENT_AMBULATORY_CARE_PROVIDER_SITE_OTHER): Payer: Medicaid Other | Admitting: Family Medicine

## 2013-05-27 VITALS — BP 131/88 | HR 94 | Temp 99.5°F | Ht 69.5 in | Wt 219.0 lb

## 2013-05-27 DIAGNOSIS — M549 Dorsalgia, unspecified: Secondary | ICD-10-CM

## 2013-05-27 DIAGNOSIS — Z23 Encounter for immunization: Secondary | ICD-10-CM

## 2013-05-27 DIAGNOSIS — F17200 Nicotine dependence, unspecified, uncomplicated: Secondary | ICD-10-CM

## 2013-05-27 DIAGNOSIS — G43009 Migraine without aura, not intractable, without status migrainosus: Secondary | ICD-10-CM

## 2013-05-27 MED ORDER — SUMATRIPTAN SUCCINATE 50 MG PO TABS: ORAL_TABLET | ORAL | Status: DC

## 2013-05-27 MED ORDER — BUPROPION HCL ER (XL) 150 MG PO TB24: 150.0000 mg | ORAL_TABLET | Freq: Two times a day (BID) | ORAL | Status: DC

## 2013-05-27 MED ORDER — TRAMADOL HCL 50 MG PO TABS: 50.0000 mg | ORAL_TABLET | Freq: Every day | ORAL | Status: DC | PRN

## 2013-05-27 NOTE — Progress Notes (Signed)
  Subjective:    Patient ID: [name], female    DOB: 08-Aug-1976, 37 y.o.   MRN: [id number]  HPI Patient is a 37 year old female who presents for follow visit to discuss the following:  #1 chronic low back pain: Patient has had low back pain since she was a little girl. Her pain is exacerbated by prolonged sitting or lying. Her pain is at the lumbar area and does not radiate. She has no associated lower extremity weakness, groin numbness, fecal or urinary incontinence. She reports that her pain becomes severe periodically and on average she has to take tramadol once daily.  #2 migraine headaches: Patient reports good control of her migraine headaches. She has a migraine every 1-2 months. She takes Imitrex at the onset of migraine and this supports the migraine. She requests a refill of Imitrex of more than 2 pills if possible.  #3 smoking: Patient continues to smoke half a pack daily. She shows a pack with her boyfriend. She is contemplative about quitting. She is not aware that smoking is likely exacerbating her chronic pain symptoms.   #4 abdominal pain: consistent with GERD. Pain free s/p PPI and H2 blocker. Taking both once daily.   Review of Systems  Neurological: Positive for headaches.       Migraine type every 1-2 months   Psychiatric/Behavioral: Negative for self-injury.       Insomnia      Objective:   Physical Exam  Constitutional: She appears well-developed and well-nourished. No distress.  Pulmonary/Chest: Effort normal.  Musculoskeletal:       Lumbar back: She exhibits decreased range of motion. She exhibits no tenderness, no bony tenderness, no swelling and no edema.  Back pain at level of L3-L4. Back Exam: Back: Normal Curvature, no deformities or CVA tenderness  Paraspinal Tenderness: absent  LE Strength 5/5  LE Sensation: in tact  LE Reflexes absent and symmetric  Straight leg raise: negative    Skin: Skin is warm and dry.  Psychiatric: She has a normal  mood and affect. Her behavior is normal. Judgment and thought content normal.          Assessment & Plan:

## 2013-05-27 NOTE — Assessment & Plan Note (Signed)
For smoking cessation (quitting): 1. Set quit date for 1-2 weeks from now.  2. Start wellbutrin 150 mg daily for three days, then twice daily  3. On day 5-7 of taking Wellbutrin quit.   Use resources to stay encouraged.  Smoking cessation support: smoking cessation hotline: 1-800-QUIT-NOW.  Here is the number to the smoking cessation classes at [facility]: [phone]

## 2013-05-27 NOTE — Assessment & Plan Note (Signed)
A: well controlled P: refilled Imitrex.

## 2013-05-27 NOTE — Assessment & Plan Note (Signed)
Regarding you back pain, chronic L3-L4 without radicular symptoms: Taking tramadol once daily as needed is appropriate as long as you are doing your part to improve pain 1. Smoking cessation: 2. Continued weight loss. Great job so far! 3. Core strengthening.   I have refilled tramadol today.

## 2013-05-27 NOTE — Patient Instructions (Addendum)
Jonette,  Thank you for coming in today.  Regarding you back pain, chronic L3-L4 without radicular symptoms: Taking tramadol once daily as needed is appropriate as long as you are doing your part to improve pain 1. Smoking cessation: 2. Continued weight loss. Great job so far! 3. Core strengthening.   I have refilled tramadol today.  For smoking cessation (quitting): 1. Set quit date for 1-2 weeks from now.  2. Start wellbutrin 150 mg daily for three days, then twice daily  3. On day 5-7 of taking Wellbutrin quit.   Use resources to stay encouraged.  Smoking cessation support: smoking cessation hotline: 1-800-QUIT-NOW.  Here is the number to the smoking cessation classes at [facility]: 562-1308  F/u with Dr. Karie Schwalbe in two weeks after starting Wellbutrin.  All the best!  Dr. Armen Pickup

## 2013-06-03 ENCOUNTER — Telehealth: Payer: Self-pay | Admitting: Family Medicine

## 2013-06-03 NOTE — Telephone Encounter (Signed)
Will forward to MD. [name],CMA  

## 2013-06-03 NOTE — Telephone Encounter (Signed)
Pharmacy told patient she may not need to take the amitriptyline because the other meds she's on make her drowsy. Patient would like confirmation from MD that she no longer needs to take amitriptyline. Please call patient.

## 2013-06-04 NOTE — Telephone Encounter (Signed)
Called and discussed this morning with patient. Encouraged her to take amitriptyline for migraine prophylaxis. Encouraged her to hold tramadol if drowsy. Patient voiced understanding. Leona Singleton, MD

## 2013-07-04 ENCOUNTER — Telehealth: Payer: Self-pay | Admitting: *Deleted

## 2013-07-04 NOTE — Telephone Encounter (Signed)
Received request for prior authorization form from pharmacy.  Copy of Medicaid formulary and form placed in MD's box for completion.  Gaylene Brooks, RN

## 2013-07-05 ENCOUNTER — Encounter: Payer: Self-pay | Admitting: Family Medicine

## 2013-07-05 NOTE — Telephone Encounter (Signed)
Prior authorization info completed in [facility].  Confirmation # [id number].  Status pending--will check back later.  Gaylene Brooks, RN

## 2013-07-05 NOTE — Telephone Encounter (Signed)
Received prior auth for sumatriptan. Unsure why this is requested from pharmacy as it is clearly listed as a preferred drug. Nevertheless, filled out PA and placed on Jeannette's desk. Please let me know if anything else needed.  Leona Singleton, MD

## 2013-07-05 NOTE — Telephone Encounter (Signed)
What is prior auth form for? I will be post-call tomorrow and may not make it to my box. Thanks. Leona Singleton, MD 07/05/2013 2:54 AM

## 2013-07-05 NOTE — Progress Notes (Signed)
Opened in error

## 2013-07-05 NOTE — Telephone Encounter (Signed)
PA request is for sumatriptan.  Gaylene Brooks, RN

## 2013-07-08 NOTE — Telephone Encounter (Signed)
Medication has been approved for 07/05/13--07/05/14.  Gaylene Brooks, RN

## 2013-07-24 ENCOUNTER — Telehealth: Payer: Self-pay | Admitting: Family Medicine

## 2013-07-24 NOTE — Telephone Encounter (Signed)
Emergency Line / After Hours Call  Patient called the emergency line stating that she has not felt well today due to nausea. Yesterday she had a bad headache and laid around most of the day. Today she has vomited everything she has eaten and has had crampy abdominal pain. She has not had a fever. Has had normal bowel movements. She wanted to know what she should do tonight. I advised that if this is something she would like to be seen for tonight that she can always come to the Emergency Room to be evaluated. I recommended that she call the clinic in the morning to schedule a same day appointment to be seen. Advised taking small sips of fluids, and that fluids are more important than solid foods to be taken by mouth. Pt understood these instructions and was appreciative.  Levert Feinstein, MD Family Medicine PGY-2

## 2013-07-25 ENCOUNTER — Encounter (HOSPITAL_COMMUNITY): Payer: Self-pay | Admitting: Emergency Medicine

## 2013-07-25 ENCOUNTER — Emergency Department (HOSPITAL_COMMUNITY)
Admission: EM | Admit: 2013-07-25 | Discharge: 2013-07-25 | Disposition: A | Discharge status: Home / Self Care | Payer: Medicaid Other | Source: Home / Self Care

## 2013-07-25 ENCOUNTER — Ambulatory Visit: Payer: Medicaid Other | Admitting: Family Medicine

## 2013-07-25 DIAGNOSIS — A088 Other specified intestinal infections: Secondary | ICD-10-CM

## 2013-07-25 DIAGNOSIS — A084 Viral intestinal infection, unspecified: Secondary | ICD-10-CM

## 2013-07-25 LAB — POCT URINALYSIS DIP (DEVICE)
Bilirubin Urine: NEGATIVE
Ketones, ur: NEGATIVE mg/dL
Protein, ur: NEGATIVE mg/dL
pH: 6.5 (ref 5.0–8.0)

## 2013-07-25 MED ORDER — ONDANSETRON 4 MG PO TBDP
4.0000 mg | ORAL_TABLET | Freq: Once | ORAL | Status: AC
  Administered 2013-07-25: 4 mg via ORAL

## 2013-07-25 MED ORDER — ONDANSETRON HCL 4 MG PO TABS: 4.0000 mg | ORAL_TABLET | Freq: Three times a day (TID) | ORAL | Status: DC | PRN

## 2013-07-25 MED ORDER — ONDANSETRON 4 MG PO TBDP
ORAL_TABLET | ORAL | Status: AC
  Filled 2013-07-25: qty 1

## 2013-07-25 NOTE — ED Notes (Signed)
C/o vomiting for three days now States she is not able to eat anything Says she is the only one at home feeling like this Denies constipation Admits to abd area in pain due to vomiting

## 2013-07-25 NOTE — ED Provider Notes (Signed)
[name] is a 37 y.o. female who presents to Urgent Care today for nausea and vomiting. Pt reports symptoms starting yesterday with vomiting each time she tried to eat. She also reports left-sided crampy abdominal pain and lower abdominal 5/10 pain, increased gas, and increase in BM (usually 1 BM q2 days, now 3 formed BMs daily). She is able to keep down liquids. Denies bloody emesis or stool. Has had chills. Denies fevers, dyspnea, rash, vaginal discharge, dyspareunia, or dysuria. Sodas help nausea. PPI and zantac do not help. Denies sick contacts or new medications/foods.   History reviewed. No pertinent past medical history. History  Substance Use Topics  . Smoking status: Current Every Day Smoker -- 1.00 packs/day for 19 years    Types: Cigarettes  . Smokeless tobacco: Not on file     Comment: down to 0.5 PPD  . Alcohol Use: No   ROS as above Medications reviewed.  No current facility-administered medications for this encounter.   Current Outpatient Prescriptions  Medication Sig Dispense Refill  . amitriptyline (ELAVIL) 50 MG tablet Take 1 tablet (50 mg total) by mouth at bedtime.  30 tablet  1  . buPROPion (WELLBUTRIN XL) 150 MG 24 hr tablet Take 1 tablet (150 mg total) by mouth 2 (two) times daily.  60 tablet  2  . cetirizine (ZYRTEC) 10 MG tablet TAKE 1 TABLET BY MOUTH EVERY DAY  30 tablet  1  . clotrimazole (LOTRIMIN) 1 % cream Apply topically 2 (two) times daily.  30 g  0  . cyclobenzaprine (FLEXERIL) 10 MG tablet Take 1 tablet (10 mg total) by mouth at bedtime as needed for muscle spasms.  30 tablet  1  . fluticasone (FLONASE) 50 MCG/ACT nasal spray USE 1 SPRAY INTO EACH NOSTRIL 2 TIMES DAILY.  16 g  1  . ipratropium (ATROVENT) 0.03 % nasal spray Place 2 sprays into the nose every 12 (twelve) hours.  30 mL  12  . pantoprazole (PROTONIX) 40 MG tablet Take 1 tablet (40 mg total) by mouth daily.  30 tablet  3  . ranitidine (ZANTAC) 150 MG capsule Take 150 mg by mouth daily.       . SUMAtriptan (IMITREX) 50 MG tablet TAKE ONE FOR HEADACHE, MAY REPEAT IN 2 HRS IF HEADACHE PERSIST  10 tablet  1  . traMADol (ULTRAM) 50 MG tablet Take 1 tablet (50 mg total) by mouth daily as needed for pain (low back pain).  30 tablet  4    Exam:  BP 158/100  Pulse 108  Temp(Src) 98.9 F (37.2 C) (Oral)  Resp 16  SpO2 100%  LMP 07/03/2013 Gen: Well NAD HEENT: EOMI,  MMM, sclera clear Lungs: Normal work of breathing. CTABL Heart: RRR no MRG Abd: NABS, Soft. Mild generalized tenderness, ND, hypoactive bowel sounds SKIN: No rash or cyanosis  Results for orders placed during the hospital encounter of 07/25/13 (from the past 24 hour(s))  POCT URINALYSIS DIP (DEVICE)     Status: Abnormal   Collection Time    07/25/13 12:29 PM      Result Value Range   Glucose, UA NEGATIVE  NEGATIVE mg/dL   Bilirubin Urine NEGATIVE  NEGATIVE   Ketones, ur NEGATIVE  NEGATIVE mg/dL   Specific Gravity, Urine 1.020  1.005 - 1.030   Hgb urine dipstick SMALL (*) NEGATIVE   pH 6.5  5.0 - 8.0   Protein, ur NEGATIVE  NEGATIVE mg/dL   Urobilinogen, UA 1.0  0.0 - 1.0 mg/dL  Nitrite NEGATIVE  NEGATIVE   Leukocytes, UA TRACE (*) NEGATIVE  POCT PREGNANCY, URINE     Status: None   Collection Time    07/25/13 12:35 PM      Result Value Range   Preg Test, Ur NEGATIVE  NEGATIVE   No results found.  Assessment and Plan: 37 y.o. female with 2 d nausea/vomiting. Likely viral gastroenteritis. Afebrile and able to hydrate. - Dosed zofran ODT here. - Rx'ed zofran prn. - Continue hydration and handwashing. - Return to PCP if no improvement by next week. Seek immediate care if severe intractable emesis and unable to keep down liquids.  Leona Singleton, MD     Leona Singleton, MD 07/25/13 1533  Leona Singleton, MD 07/25/13 [phone]

## 2013-07-26 NOTE — ED Provider Notes (Signed)
Medical screening examination/treatment/procedure(s) were performed by a resident physician or non-physician practitioner and as the supervising physician I was immediately available for consultation/collaboration.  Kennet Mccort, MD    Destine Ambroise S Laydon Martis, MD 07/26/13 0852 

## 2013-07-27 ENCOUNTER — Telehealth: Payer: Self-pay | Admitting: Family Medicine

## 2013-07-27 NOTE — Telephone Encounter (Signed)
Emergency Line Note  Patient states she was seen at urgent care a day ago and diagnosed with a stomach virus. She had vomiting and stomach cramping at that time. Since being seen her vomiting and abdominal cramping have gone away, but now she has generalized body aches. She wanted to know if this could happen after a viral illness. She states she feels well otherwise and has been able to keep food and liquids down. She denies fevers. I advised the patient that this could be related to her viral illness. She was advised to continue to drink plenty of fluids. Discussed red flags for seeking medical care, chest pain, difficulty breathing, abdominal pain, inability to keep PO intake down, worsening pain. Patient advised to follow-up tomorrow if pain has not improved. She voiced understanding of this plan.  Marikay Alar, MD  [name] PGY-2

## 2013-09-14 ENCOUNTER — Telehealth: Payer: Self-pay | Admitting: Emergency Medicine

## 2013-09-14 NOTE — Telephone Encounter (Signed)
Emergency Line Call Patient calling about upset stomach.  She states she woke up with diarrhea this morning.  That has improved, but reports nausea and some abdominal cramps.  No vomiting.  She is tolerating fluids and some food.  No fevers.  No sick contacts.  Reports had stomach flu last month and this feels similar.  She has some zofran left over and wants to know if she can take.  I recommended using the zofran q8hr as needed.  Focus of fluid intake.  If not improving or unable to tolerate fluids, she will need to be [facility].  She expressed understanding and agreement.   [name] 09/14/2013, 6:19 PM

## 2013-09-27 ENCOUNTER — Encounter: Payer: Self-pay | Admitting: Family Medicine

## 2013-09-27 ENCOUNTER — Encounter (INDEPENDENT_AMBULATORY_CARE_PROVIDER_SITE_OTHER): Payer: Medicaid Other | Admitting: Family Medicine

## 2013-09-27 ENCOUNTER — Ambulatory Visit (INDEPENDENT_AMBULATORY_CARE_PROVIDER_SITE_OTHER): Payer: Medicaid Other | Admitting: Family Medicine

## 2013-09-27 ENCOUNTER — Other Ambulatory Visit: Payer: Self-pay | Admitting: Family Medicine

## 2013-09-27 VITALS — BP 153/111 | HR 81 | Temp 98.8°F | Ht 69.5 in | Wt 222.3 lb

## 2013-09-27 DIAGNOSIS — H6091 Unspecified otitis externa, right ear: Secondary | ICD-10-CM

## 2013-09-27 DIAGNOSIS — I10 Essential (primary) hypertension: Secondary | ICD-10-CM | POA: Insufficient documentation

## 2013-09-27 DIAGNOSIS — H60399 Other infective otitis externa, unspecified ear: Secondary | ICD-10-CM

## 2013-09-27 MED ORDER — HYDROCHLOROTHIAZIDE 12.5 MG PO TABS: 12.5000 mg | ORAL_TABLET | Freq: Every day | ORAL | Status: DC

## 2013-09-27 MED ORDER — NEOMYCIN-POLYMYXIN-HC 3.5-10000-1 OT SOLN: 3.0000 [drp] | Freq: Four times a day (QID) | OTIC | Status: DC

## 2013-09-27 NOTE — Assessment & Plan Note (Addendum)
And bilateral eczema of both canals.  See instructions.

## 2013-09-27 NOTE — Assessment & Plan Note (Signed)
New dx.  Start HCTZ and FU with PCP

## 2013-09-27 NOTE — Patient Instructions (Addendum)
I sent a prescription for ear drops to use for infection/pain I also recommend that you get some over-the counter 1% or 2% hydrocortisone cream.  Use daily when ears are not painful to prevent the dryness and itching. Do your best to stop digging scratching your ears.    Cut back on salt! Important for the high blood pressure. See Dr T. In one month to make sure the blood pressure is coming down.

## 2013-09-27 NOTE — Progress Notes (Signed)
   Subjective:    Patient ID: [name], female    DOB: October 28, 1975, 38 y.o.   MRN: [id number]  HPI C/O chronic dry, itchy ears.  Uses Q tips and scratches frequently.  Also gets intermitant ear pain which has been treated as "boils" and "swimmers ear" in the past.  Today Right ear painful.  Also, BP up today and has been up.  Not on meds.  Overuses salt.    Review of Systems     Objective:   Physical ExamTMs normal  Left canal dry and scaley Rt mild injection of canal and pain on manipulation of pinna and tragus. Lungs clear Cardiac RRR without m or g        Assessment & Plan:

## 2013-09-30 NOTE — Progress Notes (Signed)
This encounter was created in error - please disregard.

## 2013-10-07 ENCOUNTER — Ambulatory Visit: Payer: Medicaid Other | Admitting: Family Medicine

## 2013-10-14 ENCOUNTER — Encounter: Payer: Self-pay | Admitting: Family Medicine

## 2013-10-14 ENCOUNTER — Ambulatory Visit (INDEPENDENT_AMBULATORY_CARE_PROVIDER_SITE_OTHER): Payer: Medicaid Other | Admitting: Family Medicine

## 2013-10-14 VITALS — BP 135/92 | HR 87 | Temp 99.0°F | Wt 225.0 lb

## 2013-10-14 DIAGNOSIS — G8929 Other chronic pain: Secondary | ICD-10-CM

## 2013-10-14 DIAGNOSIS — M545 Low back pain, unspecified: Secondary | ICD-10-CM

## 2013-10-14 DIAGNOSIS — H6091 Unspecified otitis externa, right ear: Secondary | ICD-10-CM

## 2013-10-14 DIAGNOSIS — I10 Essential (primary) hypertension: Secondary | ICD-10-CM

## 2013-10-14 DIAGNOSIS — H60399 Other infective otitis externa, unspecified ear: Secondary | ICD-10-CM

## 2013-10-14 LAB — BASIC METABOLIC PANEL
BUN: 11 mg/dL (ref 6–23)
CALCIUM: 9.4 mg/dL (ref 8.4–10.5)
CO2: 27 mEq/L (ref 19–32)
Chloride: 106 mEq/L (ref 96–112)
Creat: 0.73 mg/dL (ref 0.50–1.10)
Glucose, Bld: 90 mg/dL (ref 70–99)
Potassium: 4.3 mEq/L (ref 3.5–5.3)
SODIUM: 139 meq/L (ref 135–145)

## 2013-10-14 NOTE — Progress Notes (Signed)
Patient ID: [name], female   DOB: 10/14/1975, 38 y.o.   MRN: [id number] Subjective:   CC: F/u OM, HTN, and low back pain  HPI:   Otitis externa - Patient recently seen and diagnosed with OE and eczema of both ear canals, prescribed cortisporin otic drops, flonase, and cortisone cream for ear canals which has helped some with itching. Denies fevers and chills but ears still feel sore and itchy.  Hypertension - Started on HCTZ 12.5mg  last visit. Did not record home BPs. Takes HCTZ regularly (missing 1-2 doses) and reports frequent dizziness feeling like lightheadedness causing her to sit, which helps. Describes a lengthy h/o this with near-syncope 2 years ago. She has had no near-syncope in the past week and symptoms have been no worse since starting HCTZ. Denies syncope, chest pain, dyspnea, or LE edema.   Low back pain - Describes 1 week of right-sided pain that "feels like a nerve" shooting down the back of her leg to knee. She is unable to quantify pain but describes it as "very uncomfortable." It is worse if she sits still for a long time, necessitating rolling over to get up. She denies preceding trauma or fall. She also denies numbness, tingling, incontinence, saddle anesthesia, or weakness. Aleve, tramadol, or flexeril help by putting her to sleep. Aleve actually helps the pain some. This is the first occurrence of this shooting/radiating pain. She has had mid-lower back problems in the past and has seen PT and gotten xrays.   Review of Systems - Per HPI. Additionally, hands and feet feel like they are "ballling up" which has also been going on for ~1 week.   SH: Patient does not work currently.    Objective:  Physical Exam BP 135/92  Pulse 87  Temp(Src) 99 F (37.2 C) (Oral)  Wt 225 lb (102.059 kg)  LMP 09/22/2013 GEN: NAD HEENT: Atraumatic, normocephalic, neck supple, EOMI, sclera clear, TMs mildly cloudy/dulled bilaterally with mild erythema of EAM but no scaling or  plaques and no tenderness with manipulation of tragus and pinna. Poor dentition CV: RRR, no murmurs, rubs, or gallops PULM: CTAB, normal effort SKIN: No rash or cyanosis; warm and well-perfused EXTR: No lower extremity edema or calf tenderness, 5/5 bilateral LE strength, positive right straight leg raise eliciting shooting pain to mid-thigh but no numbess; no lumbar tenderness to palpation PSYCH: Mood and affect euthymic, normal rate and volume of speech NEURO: Awake, alert, no focal deficits grossly, normal speech and gait  Assessment:     [name] is a 38 y.o. female here for f/u of ear symptoms, HTN, and low back pain.    Plan:     # See problem list and after visit summary for problem-specific plans. - For extremity cramping, checking BMET and encouraging hydration.  # Health Maintenance: Pap smear will be due in June  Follow-up: Follow up in 2 weeks for back pain, ear, and blood pressure recheck.   Leona SingletonMaria T Satine Hausner, MD [facility]

## 2013-10-14 NOTE — Patient Instructions (Addendum)
Good to see you.  For your back pain, you seem to have sciatica. I am referring you to physical therapy.  You will learn therapy that you can continue at home. Take aleve twice daily with food for 5-10 days.  Walk every 30 minutes for 2-3 minutes to keep your back from getting stiff/painful. Use a heating pad. Come back to see me in 2 weeks or sooner if it gets worse.  For your ear, continue the ear drops and steroid cream.  We are checking labs today. I will call you if they are NOT normal.  Continue blood pressure medicine daily.  Stay hydrated to avoid dizziness. Drink 6 8-ounce glasses a day.  Sciatica Sciatica is pain, weakness, numbness, or tingling along your sciatic nerve. The nerve starts in the lower back and runs down the back of each leg. Nerve damage or certain conditions pinch or put pressure on the sciatic nerve. This causes the pain, weakness, and other discomforts of sciatica. HOME CARE   Only take medicine as told by your doctor.  Apply ice to the affected area for 20 minutes. Do this 3 4 times a day for the first 48 72 hours. Then try heat in the same way.  Exercise, stretch, or do your usual activities if these do not make your pain worse.  Go to physical therapy as told by your doctor.  Keep all doctor visits as told.  Do not wear high heels or shoes that are not supportive.  Get a firm mattress if your mattress is too soft to lessen pain and discomfort. GET HELP RIGHT AWAY IF:   You cannot control when you poop (bowel movement) or pee (urinate).  You have more weakness in your lower back, lower belly (pelvis), butt (buttocks), or legs.  You have redness or puffiness (swelling) of your back.  You have a burning feeling when you pee.  You have pain that gets worse when you lie down.  You have pain that wakes you from your sleep.  Your pain is worse than past pain.  Your pain lasts longer than 4 weeks.  You are suddenly losing weight without  reason. MAKE SURE YOU:   Understand these instructions.  Will watch this condition.  Will get help right away if you are not doing well or get worse. Document Released: 05/17/2008 Document Revised: 02/07/2012 Document Reviewed: 12/18/2011 [facility] Patient Information 2014 [place], [place].

## 2013-10-16 ENCOUNTER — Telehealth: Payer: Self-pay | Admitting: Family Medicine

## 2013-10-16 DIAGNOSIS — M5416 Radiculopathy, lumbar region: Secondary | ICD-10-CM

## 2013-10-16 NOTE — Assessment & Plan Note (Signed)
Ears still affected with mildly dull TMs but no bulging or fevers or other sign of OM. Overall, improving. - Continue cortisporin otic drops, steroid cream, and flonase. - F/u in 2 weeks when we will recheck symptoms and exam. - Return for fever or worsening ear pain.

## 2013-10-16 NOTE — Assessment & Plan Note (Signed)
Started on HCTZ 09/27/13. BP today is 135/92, moderately well-controlled. Would like tighter control for age. - Check BMET today. - Continue HCTZ. - Stay hydrated with at least six 8-oz cups of water daily.

## 2013-10-16 NOTE — Assessment & Plan Note (Signed)
Now with sciatica/lumbar radiculopathy symptoms but no red flag symptoms (no weakness, saddle anesthesia, or incontinence). - Restart PT and plan to continue exercises at home once done with sessions. - Aleve BID x 5-10 days with food. - Heating pad. Do not sit longer than without a few minutes of movement. - F/u in 2 weeks, return precautions reviewed.

## 2013-10-16 NOTE — Telephone Encounter (Signed)
Pt found out medicaid will only pay for one visit--physical therapy She cannot afford other visits if [employer] would not cover. Other options? Please advise

## 2013-10-18 ENCOUNTER — Encounter: Payer: Self-pay | Admitting: Family Medicine

## 2013-10-23 ENCOUNTER — Ambulatory Visit: Payer: Medicaid Other

## 2013-10-28 NOTE — Telephone Encounter (Signed)
Pt states that the one appt is just for evaluation and would not be helpful (as she has already had this)  She [facility] and wants to know if you need her to come see you first. Tyquon Near, Maryjo RochesterJessica Dawn

## 2013-10-28 NOTE — Telephone Encounter (Signed)
Please let her know that I think the 1 session of PT would still be helpful for learning exercises she can do at home. Afterwards, if she still would like focused therapy, we could discuss some in clinic or [facility]. What would she prefer?  Ann SingletonMaria Byrd Bridget Westbrooks, MD

## 2013-11-01 DIAGNOSIS — M5416 Radiculopathy, lumbar region: Secondary | ICD-10-CM | POA: Insufficient documentation

## 2013-11-01 NOTE — Telephone Encounter (Signed)
That is correct. Thanks Limited BrandsJazmin Hartsell,CMA

## 2013-11-01 NOTE — Telephone Encounter (Signed)
Called patient to let her know that best next step is lumbar xray since last time this was done was 2010 (results below). She states that symptoms are chronic low back pain with new radiation of pain down backside into back of leg that feels different from her leg spasms. No red flag symptoms. She is amenable to xray and plans to go for that Monday 2/16. - [facility]. Blue team, I told her she can walk in any time Monday. Please clarify with her if this is incorrect. - Continue old back PT exercises that she still remembers; prefers this to going to 1 PT session. - Return precautions reviewed.  - Determine f/u recs based on xray.  Lumbar xray from 08/05/2009:  IMPRESSION:  Negative cervical spine radiographs.  Leona SingletonMaria T Neisha Hinger, MD

## 2013-11-05 ENCOUNTER — Telehealth: Payer: Self-pay | Admitting: Family Medicine

## 2013-11-05 NOTE — Telephone Encounter (Signed)
Patient is having irregular bleeding and would like to speak to a nurse regarding if it is normal or not? Please call

## 2013-11-05 NOTE — Telephone Encounter (Signed)
Returned call to pt regarding irregular bleeding.  Pt stated she is starting to having some cramping, bloating and spotted x 1 today like she about to start her menstrual cycle again.  Pt stated last few menstrual cycles have lasted 6-7 days when normally they last 3 days.  Feburary cycle 09-27-13 and  March cycle 10-21-13, pt can't remember January's cycle.  Pt is not taking anything for birth control, denies any change with medication.  Pt stated she is having lack of sleep. Last sex was two weeks ago without condom or another protection; same partner x 15 years.  Pt denies having any other vaginal symptoms.  Pt told to contact office if increase in bleeding, severe cramping or start having vaginal discharge, itching or odor.  Pt stated understanding.  Clovis PuMartin, Tamika L, RN

## 2013-11-08 ENCOUNTER — Ambulatory Visit (HOSPITAL_COMMUNITY)
Admission: RE | Admit: 2013-11-08 | Discharge: 2013-11-08 | Disposition: A | Discharge status: Home / Self Care | Payer: Medicaid Other | Source: Ambulatory Visit | Attending: Family Medicine | Admitting: Family Medicine

## 2013-11-08 DIAGNOSIS — M545 Low back pain, unspecified: Secondary | ICD-10-CM | POA: Insufficient documentation

## 2013-11-08 DIAGNOSIS — M5416 Radiculopathy, lumbar region: Secondary | ICD-10-CM

## 2013-11-11 NOTE — Telephone Encounter (Signed)
Increased uterine bleeding could are most likely due to fibroids but could be something else - I would like for us to discuss in clinic. Please have her f/u appt with our clinic.  Ann SingletonMaria T Cintya Daughety, MD

## 2013-11-11 NOTE — Telephone Encounter (Signed)
LM for patient to call back.  Please give her message from MD and help schedule appt.  Thanks [employer] BrandsJazmin Dosha Broshears,CMA

## 2013-11-19 ENCOUNTER — Telehealth: Payer: Self-pay | Admitting: Family Medicine

## 2013-11-19 NOTE — Telephone Encounter (Signed)
Pt informed and agreeable. Alejandra Barna Dawn  

## 2013-11-19 NOTE — Telephone Encounter (Signed)
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 Many adults suffer from mild arthritis,and the best treatment is to stay active, avoid heavy lifting, and keep body weight controlled. Heating pad/ice, stretching, and tylenol or ibuprofen can help symptoms, but there is no easy cure.  We can discuss more in the office if she would like. If she develops sudden changes in symptoms or neurologic changes, she needs to seek immediate care.  Ann SingletonMaria T Wilburta Milbourn, MD

## 2013-11-26 ENCOUNTER — Encounter: Payer: Self-pay | Admitting: Family Medicine

## 2013-11-26 ENCOUNTER — Ambulatory Visit (INDEPENDENT_AMBULATORY_CARE_PROVIDER_SITE_OTHER): Payer: Medicaid Other | Admitting: Family Medicine

## 2013-11-26 VITALS — BP 129/84 | HR 98 | Temp 98.7°F | Ht 69.5 in | Wt 231.0 lb

## 2013-11-26 DIAGNOSIS — N939 Abnormal uterine and vaginal bleeding, unspecified: Secondary | ICD-10-CM | POA: Insufficient documentation

## 2013-11-26 DIAGNOSIS — H60399 Other infective otitis externa, unspecified ear: Secondary | ICD-10-CM

## 2013-11-26 DIAGNOSIS — J329 Chronic sinusitis, unspecified: Secondary | ICD-10-CM

## 2013-11-26 DIAGNOSIS — K219 Gastro-esophageal reflux disease without esophagitis: Secondary | ICD-10-CM

## 2013-11-26 DIAGNOSIS — N926 Irregular menstruation, unspecified: Secondary | ICD-10-CM

## 2013-11-26 DIAGNOSIS — F17200 Nicotine dependence, unspecified, uncomplicated: Secondary | ICD-10-CM

## 2013-11-26 DIAGNOSIS — A499 Bacterial infection, unspecified: Secondary | ICD-10-CM

## 2013-11-26 DIAGNOSIS — B9689 Other specified bacterial agents as the cause of diseases classified elsewhere: Secondary | ICD-10-CM

## 2013-11-26 DIAGNOSIS — H6091 Unspecified otitis externa, right ear: Secondary | ICD-10-CM

## 2013-11-26 LAB — CBC
HEMATOCRIT: 36.4 % (ref 36.0–46.0)
HEMOGLOBIN: 11.9 g/dL — AB (ref 12.0–15.0)
MCH: 23 pg — ABNORMAL LOW (ref 26.0–34.0)
MCHC: 32.7 g/dL (ref 30.0–36.0)
MCV: 70.3 fL — ABNORMAL LOW (ref 78.0–100.0)
Platelets: 274 10*3/uL (ref 150–400)
RBC: 5.18 MIL/uL — AB (ref 3.87–5.11)
RDW: 15 % (ref 11.5–15.5)
WBC: 10 10*3/uL (ref 4.0–10.5)

## 2013-11-26 LAB — TSH: TSH: 0.822 u[IU]/mL (ref 0.350–4.500)

## 2013-11-26 LAB — POCT URINE PREGNANCY: PREG TEST UR: NEGATIVE

## 2013-11-26 MED ORDER — AMOXICILLIN-POT CLAVULANATE 875-125 MG PO TABS: 1.0000 | ORAL_TABLET | Freq: Two times a day (BID) | ORAL | Status: DC

## 2013-11-26 MED ORDER — PANTOPRAZOLE SODIUM 40 MG PO TBEC: 40.0000 mg | DELAYED_RELEASE_TABLET | Freq: Every day | ORAL | Status: DC

## 2013-11-26 NOTE — Patient Instructions (Addendum)
Good to see you today.  For your bleeding: - Let us start with an ultrasound that we are scheduling now. - Afterwards, let us follow up to discuss results. - We are getting labs today and I will call if anything is NOT normal.  For your reflux: - Try protonix instead of zantac. - Cutting back on caffeine, tobacco, and eating no later than 3 hours before bed will help LOTS if you do it consistently. - Drink more water than soda/coffee.  For your ears: It looks like a chronic sinusitis/ear infection with possible bacterial component at this point given how long you've been feeling sick. - Try augmentin twice daily for 10 days. - Follow up with me in 1-2 weeks to see how symptoms are doing. - Try white vinegar mixed with water and use 1-2 drops 2-3 times daily.  Best,  Leona SingletonMaria T Ladoris Lythgoe, MD  Abnormal Uterine Bleeding Abnormal uterine bleeding means bleeding from the vagina that is not your normal menstrual period. This can be:  Bleeding or spotting between periods.  Bleeding after sex (sexual intercourse).  Bleeding that is heavier or more than normal.  Periods that last longer than usual.  Bleeding after menopause. There are many problems that may cause this. Treatment will depend on the cause of the bleeding. Any kind of bleeding that is not normal should be reviewed by your doctor.  HOME CARE Watch your condition for any changes. These actions may lessen any discomfort you are having:  Do not use tampons or douches as told by your doctor.  Change your pads often. You should get regular pelvic exams and Pap tests. Keep all appointments for tests as told by your doctor. GET HELP IF:  You are bleeding for more than 1 week.  You feel dizzy at times. GET HELP RIGHT AWAY IF:   You pass out.  You have to change pads every 15 to 30 minutes.  You have belly pain.  You have a fever.  You become sweaty or weak.  You are passing large blood clots from the  vagina.  You feel sick to your stomach (nauseous) and throw up (vomit). MAKE SURE YOU:  Understand these instructions.  Will watch your condition.  Will get help right away if you are not doing well or get worse. Document Released: 06/05/2009 Document Revised: 05/29/2013 Document Reviewed: 03/07/2013 [facility] Patient Information 2014 [place], [place].

## 2013-11-26 NOTE — Progress Notes (Signed)
Patient ID: [name], female   DOB: 06-26-1976, 38 y.o.   MRN: [id number] Subjective:   CC: Uterine bleeding, ear pain  HPI:   Uterine bleeding - Patient reports cramping, bloating, and spotting 3/17, present for first time since she was 38 years old. H/o ovarian cysts, molar pregnancy at 89, and recent periods lasting 6-7 days (comprared to previous 3 days). LMP 3/2. NO recent medication change, not on birth control. Also sleeps poorly. Last sexual activity 2 weeks ago with no condom/protection. Same partner 15 years. No vaginal symptoms, fevers, chills, dyspareunia.   Chest pain - Patient reports nightly feeling of "air trapped inside" for 2 days that has occurred in the past. Makes her breathe hard. She thinks there is some relationship with allergies, as she is also sneezing. She coughs hard to get phlegm out, worse in morning. Denies fever. Uses zantac 150mg  daily and flonase. Lately reflux has been worse. She eats junk food late at night because she eats little throughout the day. She also feels congested. Drinks a pot of cofee and sometimes drinks second pot after 4pm. Also loves sodas. Feels dizzy at baseline, not related to chest pain. Denies nausea, pressure, or diaphoresis.  Ear pain - "a while" of itchiness. Cortisporin drops did not really help. Worse on right ear. Thinks it is allergies. No fevers, some drainage. Does use qtip just at edge. Itching and discomfort are inside ear, not at canal.   Review of Systems - Per HPI.  Smoking status: Current everyday smoker.    Objective:  Physical Exam BP 129/84  Pulse 98  Temp(Src) 98.7 F (37.1 C) (Oral)  Ht 5' 9.5" (1.765 m)  Wt 231 lb (104.781 kg)  BMI 33.64 kg/m2  LMP 11/12/2013 GEN: NAD HEENT: Atraumatic, normocephalic, neck supple, EOMI, sclera clear, right ear canal with some yellow crusting, bilateral dull TMs. CV: RRR, no murmurs, rubs, or gallops PULM: CTAB, normal effort ABD: Soft, nontender, nondistended, NABS,  no organomegaly SKIN: No rash or cyanosis; warm and well-perfused EXTR: No lower extremity edema or calf tenderness PSYCH: Mood and affect euthymic, normal rate and volume of speech NEURO: Awake, alert, no focal deficits grossly, normal speech    Assessment:     [name] is a 38 y.o. female here for eval of uterine bleeding, persistent ear symptoms, and chest pain.    Plan:     Abnormal vaginal bleeding - Uncertain etiology, though fibroids likely. Would also want to rule out pregnancy complications, thyroid dysfunction, or malignancy. - TSH>>normal. - CBC>>very mildly low at 11.9 with low MCV. - pap smear due June - Upreg>>negative - TVUS/Abd US scheduled 4/20 at 10:30AM. Then decide on biopsy.  GERD - Most likely given multiple syptom aggravators (caffeine, eating late, smoking, stress).  - Eat earlier in the day so you are not so hungry at night. - Do not eat 3 hours before bed. - Cut back on coffee for now. Eventually we will also work on smoking cessation. - Drink more water.  - Re-prescribing PPI. Stop zantac for now. Stop PPI once symptoms more controlled. - review wellbutrin dosing at f/u  Otitis media/persistent sinusitis - Given hx and exam findings, will treat with abx. - Augmentin given symptoms ongoing 2 weeks. - Vinegar drops mixed with water to help avoid OE.   Follow-up: Follow up in 1-2 weeks for f/u of ear symptoms.     Ann SingletonMaria T Ariza Evans, MD [facility]

## 2013-11-27 ENCOUNTER — Telehealth: Payer: Self-pay | Admitting: Family Medicine

## 2013-11-27 NOTE — Telephone Encounter (Addendum)
Called patient to discuss labs but did not answer. Please call and let her know  - Thyroid is normal. - Hemoglobin is very mildly low (11.9) and we can consider workup or treatment with iron at follow up. - I will make decision about endometrial biopsy after seeing result of transvaginal ultrasound scheduled 4/20. If she does not hear from me ~1 week after that study, she should call us to inquire as it may mean I have not gotten the report.   Leona SingletonMaria T Akshath Mccarey, MD

## 2013-11-30 NOTE — Assessment & Plan Note (Signed)
Most likely given multiple syptom aggravators (caffeine, eating late, smoking, stress).  - Eat earlier in the day so you are not so hungry at night. - Do not eat 3 hours before bed. - Cut back on coffee for now. Eventually we will also work on smoking cessation. - Drink more water.  - Re-prescribing PPI. Stop zantac for now. Stop PPI once symptoms more controlled. - review wellbutrin dosing at f/u

## 2013-11-30 NOTE — Assessment & Plan Note (Signed)
Now likely otitis media / persistent sinusitis, given hx and exam findings, will treat with abx. - Augmentin given symptoms ongoing 2 weeks. - Vinegar drops mixed with water to help avoid OE.

## 2013-11-30 NOTE — Assessment & Plan Note (Signed)
Uncertain etiology, though fibroids likely. Would also want to rule out pregnancy complications, thyroid dysfunction, or malignancy. - TSH>>normal. - CBC>>very mildly low at 11.9 with low MCV. - pap smear due June - Upreg>>negative - TVUS/Abd US scheduled 4/20 at 10:30AM. Then decide on biopsy.

## 2013-12-02 NOTE — Telephone Encounter (Signed)
Pt is aware of results and will wait to hear from us. [name],CMA

## 2013-12-09 ENCOUNTER — Ambulatory Visit (HOSPITAL_COMMUNITY)
Admission: RE | Admit: 2013-12-09 | Discharge: 2013-12-09 | Disposition: A | Discharge status: Home / Self Care | Payer: Medicaid Other | Source: Ambulatory Visit | Attending: Family Medicine | Admitting: Family Medicine

## 2013-12-09 ENCOUNTER — Encounter: Payer: Self-pay | Admitting: Family Medicine

## 2013-12-09 DIAGNOSIS — N925 Other specified irregular menstruation: Secondary | ICD-10-CM | POA: Insufficient documentation

## 2013-12-09 DIAGNOSIS — N949 Unspecified condition associated with female genital organs and menstrual cycle: Secondary | ICD-10-CM | POA: Insufficient documentation

## 2013-12-09 DIAGNOSIS — N938 Other specified abnormal uterine and vaginal bleeding: Secondary | ICD-10-CM | POA: Insufficient documentation

## 2013-12-09 DIAGNOSIS — N939 Abnormal uterine and vaginal bleeding, unspecified: Secondary | ICD-10-CM

## 2013-12-13 ENCOUNTER — Telehealth: Payer: Self-pay | Admitting: Family Medicine

## 2013-12-13 NOTE — Telephone Encounter (Signed)
Please call to let patient know her abdominal US was normal. I would still like to meet with her to discuss the possibility of a biopsy and follow up on vaginal itching she has been discussing with me via Mychart patient email.  Ann SingletonMaria T Ayaka Andes, MD

## 2013-12-13 NOTE — Telephone Encounter (Signed)
error 

## 2013-12-16 ENCOUNTER — Encounter: Payer: Self-pay | Admitting: *Deleted

## 2013-12-16 NOTE — Telephone Encounter (Signed)
Tried to call patient but phone just rang.  Will send her a [employer] [facility]. Jazmin Hartsell,CMA

## 2014-01-09 ENCOUNTER — Ambulatory Visit (INDEPENDENT_AMBULATORY_CARE_PROVIDER_SITE_OTHER): Payer: Medicaid Other | Admitting: Family Medicine

## 2014-01-09 ENCOUNTER — Encounter: Payer: Self-pay | Admitting: Family Medicine

## 2014-01-09 VITALS — BP 132/81 | HR 76 | Temp 98.6°F | Ht 69.5 in | Wt 229.0 lb

## 2014-01-09 DIAGNOSIS — L309 Dermatitis, unspecified: Secondary | ICD-10-CM

## 2014-01-09 DIAGNOSIS — N7689 Other specified inflammation of vagina and vulva: Secondary | ICD-10-CM

## 2014-01-09 DIAGNOSIS — N72 Inflammatory disease of cervix uteri: Secondary | ICD-10-CM

## 2014-01-09 MED ORDER — HYDROXYZINE HCL 10 MG PO TABS: ORAL_TABLET | ORAL | Status: DC

## 2014-01-09 NOTE — Progress Notes (Signed)
Patient ID: [name], female   DOB: 1976/08/12, 38 y.o.   MRN: [id number] Subjective:   CC: Vaginal itching  HPI:   Vaginal itching Patient reports itching at labia majora. Denies skin changes. Has been using hydrocortisone 1% daily for ~3 weeks with some improvement but still severe itching. She has worse itching with urination, so she tries to wash herself frequently. Denies fevers, chills, abnormal discharge, pain, or burning. Denies symptoms inside vagina or abnormal discharge. Also denies dysuria or abdominal pain. She has had problems with eczema in the past.  Review of Systems - Per HPI.  PMH: - has been using hydrocortisone cream x 3 weeks.  Objective:  Physical Exam BP 132/81  Pulse 76  Temp(Src) 98.6 F (37 C) (Oral)  Ht 5' 9.5" (1.765 m)  Wt 229 lb (103.874 kg)  BMI 33.34 kg/m2  LMP 12/19/2013 GEN: NAD, pleasant HEENT: Atraumatic, normocephalic, neck supple, EOMI, sclera clear  PULM: normal effort ABD: Soft, nontender, nondistended SKIN: No rash or cyanosis; warm and well-perfused PSYCH: Mood and affect euthymic, normal rate and volume of speech NEURO: Awake, alert, no focal deficits grossly, normal speech GU: Vaginal itching on palpation to labia majora, no skin abnormalities, rash, erythema, discharge, or hypopigmentation/atrophy.    Assessment:     [name] is a 38 y.o. female here for vaginal itching.    Plan:     Vulvar dermatitis Most likely cause of vulvar itching. No ulcers or rash on exam or other skin abnormalities. Mild improvement with topical 1% hydrocortisone.  - Break the itch/scratch cycle:    - Do not wash as much    - seal in moisture with patroleum jelly    - atarax 10mg  7pm for severe itching.    - hydrocortisone 1% cream daily total 4 wks, then 2x weekly indefinitely    - Avoid chemicals and perfumes - Follow up in 2 weeks. - Return precautions reviewed. Culture if rash or ulcer develops - if no reponse, patch test or  bx - increase potency if no improvement, or try intralesional triamcinolone injection.  # Health Maintenance: Not discussed  Follow-up: Follow up in 2 weeks for f/u of vaginal itching.  Leona SingletonMaria T Oceane Fosse, MD [facility]

## 2014-01-09 NOTE — Patient Instructions (Signed)
You likely have vulvar dermatitis. This is like eczema of the skin of your labia.  - The main treatment is to break the itch - scratch cycle. - Use vaseline to seal in the moisture. - Do not wash as much, to try to avoid excessive drying. - Do not use vaginal chemicals or perfumes. - Use hydrocortisone 1% daily for total 4 weeks (including time you have already used it). Then twice weekly indefinitely. Stop if skin starts becoming light colored or thinner. - Use atarax 10mg  at 7pm for nights when itching is severe. - Follow up with me in 2 weeks if not improved. - Seek immediate care if you develop fevers, chills, discharge, ulcers, or new symptoms.

## 2014-01-10 DIAGNOSIS — L309 Dermatitis, unspecified: Secondary | ICD-10-CM | POA: Insufficient documentation

## 2014-01-10 NOTE — Assessment & Plan Note (Signed)
Most likely cause of vulvar itching. No ulcers or rash on exam or other skin abnormalities. Mild improvement with topical 1% hydrocortisone.  - Break the itch/scratch cycle:    - Do not wash as much    - seal in moisture with patroleum jelly    - atarax 10mg  7pm for severe itching.    - hydrocortisone 1% cream daily total 4 wks, then 2x weekly indefinitely    - Avoid chemicals and perfumes - Follow up in 2 weeks. - Return precautions reviewed. Culture if rash or ulcer develops - if no reponse, patch test or bx - increase potency if no improvement, or try intralesional triamcinolone injection.

## 2014-01-12 ENCOUNTER — Other Ambulatory Visit: Payer: Self-pay | Admitting: Family Medicine

## 2014-01-14 ENCOUNTER — Telehealth: Payer: Self-pay | Admitting: Family Medicine

## 2014-01-14 NOTE — Telephone Encounter (Signed)
Was prescribed adderex for itching. Was told to take it a 7 pm. Would like to take at 7 and it make her tired. Can she take it at another time? Please advise

## 2014-01-15 NOTE — Telephone Encounter (Signed)
Patient informed, expressed understanding. 

## 2014-01-15 NOTE — Telephone Encounter (Addendum)
Yes, please call to let her know she can take it once daily at whatever is her itchiest time and when she can tolerate a little bit of drowsiness.  Thanks.  Leona Singleton, MD

## 2014-02-08 ENCOUNTER — Other Ambulatory Visit: Payer: Self-pay | Admitting: Family Medicine

## 2014-02-11 ENCOUNTER — Telehealth: Payer: Self-pay | Admitting: Family Medicine

## 2014-02-11 NOTE — Telephone Encounter (Signed)
Pt called because the allergy medication that she has been given is not working. She would like Dr. Lucienne Minkshekkekandam's nurse to call her so that she can get something else called in. jw

## 2014-02-12 MED ORDER — LORATADINE 10 MG PO TABS: 10.0000 mg | ORAL_TABLET | Freq: Every day | ORAL | Status: DC

## 2014-02-12 NOTE — Telephone Encounter (Signed)
Please call to let her know: We can try loratadine (claritin). I will send this rx in.  Please also encourage her to take her fluticasone nasal spray daily and loratadine daily, as daily use with help best with symptoms.  If symptoms still are not under control, she can follow up in clinic. Please also clarify what her symptoms are.  Ann SingletonMaria T Tomeika Weinmann, MD PGY-2, [facility]

## 2014-02-13 NOTE — Telephone Encounter (Signed)
Pt informed of the below message.  She is agreeable, but wants to discuss an allergy referral at her appt on Thursday. [name], [name]

## 2014-02-20 ENCOUNTER — Other Ambulatory Visit (HOSPITAL_COMMUNITY)
Admission: RE | Admit: 2014-02-20 | Discharge: 2014-02-20 | Disposition: A | Discharge status: Home / Self Care | Payer: Medicaid Other | Source: Ambulatory Visit | Attending: Family Medicine | Admitting: Family Medicine

## 2014-02-20 ENCOUNTER — Ambulatory Visit (INDEPENDENT_AMBULATORY_CARE_PROVIDER_SITE_OTHER): Payer: Medicaid Other | Admitting: Family Medicine

## 2014-02-20 ENCOUNTER — Encounter: Payer: Self-pay | Admitting: *Deleted

## 2014-02-20 ENCOUNTER — Encounter: Payer: Self-pay | Admitting: Family Medicine

## 2014-02-20 VITALS — BP 125/88 | HR 97 | Temp 98.1°F | Wt 225.0 lb

## 2014-02-20 DIAGNOSIS — L309 Dermatitis, unspecified: Secondary | ICD-10-CM

## 2014-02-20 DIAGNOSIS — N926 Irregular menstruation, unspecified: Secondary | ICD-10-CM

## 2014-02-20 DIAGNOSIS — Z01419 Encounter for gynecological examination (general) (routine) without abnormal findings: Secondary | ICD-10-CM | POA: Insufficient documentation

## 2014-02-20 DIAGNOSIS — Z1151 Encounter for screening for human papillomavirus (HPV): Secondary | ICD-10-CM | POA: Insufficient documentation

## 2014-02-20 DIAGNOSIS — T7840XA Allergy, unspecified, initial encounter: Secondary | ICD-10-CM | POA: Insufficient documentation

## 2014-02-20 DIAGNOSIS — Z124 Encounter for screening for malignant neoplasm of cervix: Secondary | ICD-10-CM

## 2014-02-20 DIAGNOSIS — N939 Abnormal uterine and vaginal bleeding, unspecified: Secondary | ICD-10-CM

## 2014-02-20 DIAGNOSIS — N72 Inflammatory disease of cervix uteri: Secondary | ICD-10-CM

## 2014-02-20 DIAGNOSIS — N7689 Other specified inflammation of vagina and vulva: Secondary | ICD-10-CM

## 2014-02-20 MED ORDER — OLOPATADINE HCL 0.1 % OP SOLN: 1.0000 [drp] | Freq: Two times a day (BID) | OPHTHALMIC | Status: DC

## 2014-02-20 MED ORDER — EPINEPHRINE 0.3 MG/0.3ML IJ SOAJ: 0.3000 mg | Freq: Once | INTRAMUSCULAR | Status: DC

## 2014-02-20 NOTE — Assessment & Plan Note (Signed)
Per patient, resolved after a few days and only occurred with that one menstrual cycle. Pelvic US was normal. - She is hesitant to get endometrial bx at this time. - If irregular bleeding recurrence, patient is to return for endometrial biopsy.

## 2014-02-20 NOTE — Assessment & Plan Note (Signed)
Vaginal itching was thought to be due to vulvar dermatitis. Hydrocortisone 1% cream helped minimally, and Atarax at bedtime provides relief but only overnight. -Patient can resume using hydrocortisone 1% cream 2 times a week as needed. -We have discussed patch testing/biopsy, but patient does not want today. Asked her to reconsider and return for this if symptoms are not improving. -Continue petroleum jelly and atarax qhs prn. -At f/u, we can increase potency if continued itching or try intralesional triamcinolone injection.

## 2014-02-20 NOTE — Progress Notes (Signed)
Prior Authorization received from [facility] for Patanol 0.1% eye drops. Formulary and PA form placed in provider box for completion. Clovis PuMartin, Eupha Lobb L, RN

## 2014-02-20 NOTE — Progress Notes (Signed)
Patient ID: Linward Headlandameka C Ellender, female   DOB: 29-Mar-1976, 38 y.o.   MRN: [id number] Subjective:   CC: Pap smear, itchy eyes, vaginal itching   HPI:   Pap smear Patient is due for Pap smear.  Last documented Pap was June 2012 and was negative.  Vaginal itching Patient was previously diagnosed with vulvar dermatitis in may of 2015. She was prescribed hydrocortisone 1% cream daily for 4 weeks followed by 2 times weekly indefinitely, but after 4 weeks she stopped using it. It helped a little bit. She is still very itchy but denies any rash or ulceration. She is most itchy on labia majora, minora, and mons pubis. She does not douche and uses Dove soap. She denies fevers or chills. She does not want a biopsy today. She uses Atarax before bedtime which was increased to 25 mg.  Itchy eyes  Patient reports that eyes get itchy and very swollen when she eats any type of fish or shellfish. Her ears and throat also become itchy. She has not had any breathing issues. She takes Flonase and Claritin which did not seem to help at all. She is requesting an allergy referral. She very much likes seafood and finds it very hard to not eat these things.  Review of Systems - Per HPI.   Social history:  Patient loves seafood Patient is currently not working     Objective:  Physical Exam BP 125/88  Pulse 97  Temp(Src) 98.1 F (36.7 C) (Oral)  Wt 225 lb (102.059 kg)  LMP 02/07/2014 GEN: NAD Extremities: No calf tenderness or edema Genitourinary: The skin of mons pubis and labia majora and minora is normal appearing with no rash or ulceration Vaginal mucosa and cervix are normal appearing, with 1-2 small nabothian cysts present on cervix No cervical motion tenderness Uterus and adnexa within normal mobility and size HEENT: Sclera clear, extraocular movements intact, atraumatic/normocephalic, normal sounding voice, eyes are mildly watering Pulmonary: Normal effort    Assessment:     Linward Headlandameka C Batalla  is a 38 y.o. female with h/o allergic rhinitis (seasonal), vulvar dermatitis, and abnormal vaginal bleeding here for pap smear, f/u vaginal itching, and to discuss itchy eyes.    Plan:     Pap smear June 2012 Pap smear was negative. -Performed today.  Vaginal itching Vaginal itching was thought to be due to vulvar dermatitis. Hydrocortisone 1% cream helped minimally, and Atarax at bedtime provides relief but only overnight. -Patient can resume using hydrocortisone 1% cream 2 times a week as needed. -We have discussed patch testing/biopsy, but patient does not want today. Asked her to reconsider and return for this if symptoms are not improving. -Continue petroleum jelly and atarax qhs prn. -At f/u, we can increase potency if continued itching or try intralesional triamcinolone injection.  Itchy eyes Not currently itchy. It occurs when exposed to seafood. Consistent with allergic reaction to shellfish and fish. Concerning components includes throat itchiness which is concerning for the potential for anaphylaxis, though she does not report ever having any shortness of breath. -Strongly encouraged patient to stay away from shellfish and fish. She has a hard time with this due to loving fish. -Allergy referral placed. -Antihistamine eyedrop with mast cell stabilizing properties  (olopatadine) prescribed. -EpiPen also prescribed and strongly encouraged patient to carry this with her at all times. -Recommended refrigerated artificial tears throughout the day as needed.  Previous vaginal bleeding Per patient, resolved after a few days and only occurred with that one menstrual cycle. Pelvic  US was normal. - She is hesitant to get endometrial bx at this time. - If irregular bleeding recurrence, patient is to return for endometrial biopsy.  # Health Maintenance: pap smear today  Follow-up: Follow up when convenient for skin biopsy of mons pubis and/or labia majora.   Leona SingletonMaria T Bettina Warn,  MD [facility]

## 2014-02-20 NOTE — Assessment & Plan Note (Signed)
Not currently itchy. It occurs when exposed to seafood. Consistent with allergic reaction to shellfish and fish. Concerning components includes throat itchiness which is concerning for the potential for anaphylaxis, though she does not report ever having any shortness of breath. -Strongly encouraged patient to stay away from shellfish and fish. She has a hard time with this due to loving fish. -Allergy referral placed. -Antihistamine eyedrop with mast cell stabilizing properties  (olopatadine) prescribed. -EpiPen also prescribed and strongly encouraged patient to carry this with her at all times. -Recommended refrigerated artificial tears throughout the day as needed.

## 2014-02-20 NOTE — Patient Instructions (Signed)
I will call you if pap smear is NOT normal.  Consider returning for a skin biopsy. You can use your hydrocortisone cream twice per week.  If you start bleeding again abnormally, we need to do a biopsy.  Use refrigerated artificial tears throughout the day. Before a known exposure, use your allergic eye drops. Stay away from fish and seafood. If you do not hear from the allergist referral in 1-2 weeks, call me. Have the epipen with you at all times.  Best,  Leona SingletonMaria T Wyndell Cardiff, MD

## 2014-02-22 ENCOUNTER — Telehealth: Payer: Self-pay | Admitting: Family Medicine

## 2014-02-22 NOTE — Telephone Encounter (Signed)
After hours call  Pt calling stating tha tshe is still spotting 2 days after a pap smear. She is having to use a pad but still describes it as spotting. She denies cramping and pain.   I reassured her stating that this is a normal amount of bleeding. I welcomed her to call back with other questions and encouraged her to call her PCP monday if it hasn't stopped.   Murtis SinkSam Samaa Ueda, MD [facility] Resident, PGY-3 02/22/2014, 4:52 PM

## 2014-02-24 ENCOUNTER — Other Ambulatory Visit: Payer: Self-pay | Admitting: Family Medicine

## 2014-02-24 LAB — CYTOLOGY - PAP

## 2014-02-24 MED ORDER — OLOPATADINE HCL 0.2 % OP SOLN: 1.0000 [drp] | Freq: Every day | OPHTHALMIC | Status: DC

## 2014-02-24 NOTE — Progress Notes (Signed)
Changed to "pataday" as this is preferred. See my "refill" encounter.  Ann SingletonMaria T Ricahrd Schwager, MD

## 2014-02-24 NOTE — Progress Notes (Signed)
Patient ID: Linward Headlandameka C Kaneko, female   DOB: July 09, 1976, 38 y.o.   MRN: [id number]  Milagros Reapataday is preferred so I will prescribe that instead. Thank you.  Leona SingletonMaria T Mieke Brinley, MD

## 2014-02-24 NOTE — Progress Notes (Signed)
Patient ID: [name], female   DOB: October 12, 1975, 38 y.o.   MRN: [id number] I have reviewed this encounter and agree with the resident's documentation and plan.  Donnella ShamKyle Para Cossey MD

## 2014-02-28 ENCOUNTER — Encounter: Payer: Self-pay | Admitting: Family Medicine

## 2014-04-19 ENCOUNTER — Other Ambulatory Visit: Payer: Self-pay | Admitting: Family Medicine

## 2014-05-05 ENCOUNTER — Telehealth: Payer: Self-pay | Admitting: Family Medicine

## 2014-05-05 NOTE — Telephone Encounter (Signed)
Pt thinks she has a yeast infection.  Appt made for same day tomorrow. [name], [name]

## 2014-05-05 NOTE — Telephone Encounter (Signed)
Pt called and would like a nurse to call her. She would not go into details. jw

## 2014-05-06 ENCOUNTER — Ambulatory Visit: Payer: Medicaid Other | Admitting: Family Medicine

## 2014-05-06 NOTE — Telephone Encounter (Signed)
What can she get over the counter for her vaginal itching? [phone] [id number]

## 2014-05-06 NOTE — Telephone Encounter (Signed)
Please let her know she can try monistat if she thinks it is yeast. She can also try moisturizing with thin coat of vaseline. If she has fevers/chills, we should eval in clinic.  Thanks.  Leona Singleton, MD

## 2014-05-06 NOTE — Telephone Encounter (Signed)
Pt informed. [name]  

## 2014-05-06 NOTE — Telephone Encounter (Signed)
Also let her know that if this is the same itching as before, we will need to discuss in clinic any further tx would be stronger steroid which would have to be by prescription. ThxLeona Singleton, MD

## 2014-05-06 NOTE — Telephone Encounter (Signed)
Will forward to MD to advise.  Cancelled appt for today. Jazmin Hartsell,CMA

## 2014-05-10 ENCOUNTER — Encounter: Payer: Self-pay | Admitting: Family Medicine

## 2014-05-18 ENCOUNTER — Telehealth: Payer: Self-pay | Admitting: Family Medicine

## 2014-05-18 NOTE — Telephone Encounter (Signed)
[name]  Patient's name and date of birth verified at time of call. Patient is a 38yo female with history of allergic rhinitis and migraine that reports nasal congestion and lightheadedness that started a few months ago but has worsened a little today. Has not fallen, has not lost consciousness and is stable on her feet. She has an associated frontal headache that started yesterday for which she took some Imitrex. Headache was not improved with treatment. She generally uses Claritin as needed and a Nasonex. She mentions some slight blurriness. She has been drinking water which has not helped much with her symptoms. She also had a cup of coffee earlier today She reports no fevers. Has had two episodes of non-bloody, non-bilious emesis that occurred after eating.  Assessment/Plan Symptoms appear related to allergic rhinitis and nasal congestion.  Use Nasonex 2 sprays each nare  Claritin daily  Tylenol for headache  Patient to call office tomorrow morning for a same day appointment if symptoms persist. Red flags discussed with patient.

## 2014-05-19 ENCOUNTER — Telehealth: Payer: Self-pay | Admitting: Family Medicine

## 2014-05-19 ENCOUNTER — Ambulatory Visit: Payer: Medicaid Other | Admitting: Family Medicine

## 2014-05-19 NOTE — Telephone Encounter (Signed)
Pt spoke with oncall doctor yesterday, is asking to speak with RN today for further advice. Pt scheduled for ov Wednesday for different reason, wants to know if she can be seen for both issues on Wednesday, if not would like RN advice.

## 2014-05-19 NOTE — Telephone Encounter (Signed)
Returned pt's call; pt stated she is having some chest congestion, headache and coughing.  Pt stated her chest is sore from coughing.  Pt had an appt today with PCP, but rescheduled for Wednesday.  Pt advised to go to urgent care or ED if symptoms worsen. Pt stated she spoke with on call RN at [facility]; told to take over the counter medication and follow up PCP.  Pt stated she could not make an appt tomorrow due to other obligations.  Will forward to PCP for further advise.  Clovis Pu, RN

## 2014-05-20 NOTE — Telephone Encounter (Signed)
This is appropriate. She could also drink plenty of warm fluids and try tea with honey to see if this soothes her throat. If she is having postnasal drip, a nasal steroid like fluticasone (now over the counter) could help after using a few days.  Ann SingletonMaria T Rosio Weiss, MD

## 2014-05-21 ENCOUNTER — Encounter: Payer: Self-pay | Admitting: Family Medicine

## 2014-05-21 ENCOUNTER — Ambulatory Visit (INDEPENDENT_AMBULATORY_CARE_PROVIDER_SITE_OTHER): Payer: Medicaid Other | Admitting: Family Medicine

## 2014-05-21 VITALS — BP 141/94 | HR 64 | Temp 98.6°F | Ht 69.5 in | Wt 232.0 lb

## 2014-05-21 DIAGNOSIS — M5441 Lumbago with sciatica, right side: Secondary | ICD-10-CM

## 2014-05-21 DIAGNOSIS — G8929 Other chronic pain: Secondary | ICD-10-CM

## 2014-05-21 DIAGNOSIS — J069 Acute upper respiratory infection, unspecified: Secondary | ICD-10-CM

## 2014-05-21 DIAGNOSIS — M545 Low back pain, unspecified: Secondary | ICD-10-CM

## 2014-05-21 DIAGNOSIS — M5442 Lumbago with sciatica, left side: Secondary | ICD-10-CM

## 2014-05-21 DIAGNOSIS — M5430 Sciatica, unspecified side: Secondary | ICD-10-CM

## 2014-05-21 DIAGNOSIS — J301 Allergic rhinitis due to pollen: Secondary | ICD-10-CM

## 2014-05-21 MED ORDER — TRIAMCINOLONE ACETONIDE 55 MCG/ACT NA AERO: 2.0000 | INHALATION_SPRAY | Freq: Every day | NASAL | Status: DC

## 2014-05-21 MED ORDER — GUAIFENESIN ER 600 MG PO TB12: 1200.0000 mg | ORAL_TABLET | Freq: Two times a day (BID) | ORAL | Status: DC

## 2014-05-21 MED ORDER — CYCLOBENZAPRINE HCL 10 MG PO TABS: 10.0000 mg | ORAL_TABLET | Freq: Every evening | ORAL | Status: DC | PRN

## 2014-05-21 MED ORDER — TRAMADOL HCL 50 MG PO TABS: 50.0000 mg | ORAL_TABLET | Freq: Every day | ORAL | Status: DC | PRN

## 2014-05-21 MED ORDER — MONTELUKAST SODIUM 10 MG PO TABS: 10.0000 mg | ORAL_TABLET | Freq: Every day | ORAL | Status: DC

## 2014-05-21 NOTE — Progress Notes (Signed)
    Subjective:    Patient ID: [name] is a 38 y.o. female presenting with head congestion and white patches on skin  on 05/21/2014  HPI: White patches on arms with itching and worse with scratching. Also has congestion, sinus issues.  Review of Systems  Constitutional: Negative for fever and chills.  HENT: Positive for congestion, rhinorrhea, sinus pressure and sore throat.   Respiratory: Negative for shortness of breath.   Cardiovascular: Negative for chest pain.  Gastrointestinal: Positive for nausea. Negative for vomiting and abdominal pain.  Genitourinary: Negative for dysuria.  Skin: Negative for rash.      Objective:    BP 141/94  Pulse 64  Temp(Src) 98.6 F (37 C) (Oral)  Ht 5' 9.5" (1.765 m)  Wt 232 lb (105.235 kg)  BMI 33.78 kg/m2 Physical Exam  Constitutional: She is oriented to person, place, and time. She appears well-developed and well-nourished. No distress.  HENT:  Head: Normocephalic and atraumatic.  Right Ear: Tympanic membrane normal.  Left Ear: Tympanic membrane normal.  Nose: Mucosal edema present.  Mouth/Throat: No oropharyngeal exudate or posterior oropharyngeal erythema.  Eyes: No scleral icterus.  Neck: Neck supple.  Cardiovascular: Normal rate.   Pulmonary/Chest: Effort normal.  Abdominal: Soft.  Neurological: She is alert and oriented to person, place, and time.  Skin: Skin is warm and dry.  Psychiatric: She has a normal mood and affect.        Assessment & Plan:   Problem List Items Addressed This Visit     Unprioritized   ALLERGIC RHINITIS, SEASONAL     Recurring problem, not currently controlled with Claritin and Nasacort--may try change to allegra--possible--has failed Zyrtec also.  Addition of Singulair--continue with Allergist and ask about possiblity of allergy shots--to be self-administered.    Relevant Medications      NASACORT AQ 55 MCG/ACT NA AERS      montelukast (SINGULAIR) tablet 10 mg   Chronic lumbar pain  - Primary     Refilled her Flexeril and Ultram    Relevant Medications      cyclobenzaprine (FLEXERIL) tablet      traMADol (ULTRAM) 50 MG tablet    Other Visit Diagnoses   Bilateral low back pain with sciatica, sciatica laterality unspecified        Relevant Medications       cyclobenzaprine (FLEXERIL) tablet       traMADol (ULTRAM) 50 MG tablet    Acute upper respiratory infections of unspecified site        Relevant Medications       MUCINEX 600 MG PO TB12        Return in about 3 months (around 08/20/2014) for a follow-up.

## 2014-05-21 NOTE — Assessment & Plan Note (Signed)
Refilled her Flexeril and Ultram

## 2014-05-21 NOTE — Assessment & Plan Note (Signed)
Recurring problem, not currently controlled with Claritin and Nasacort--may try change to allegra--possible--has failed Zyrtec also.  Addition of Singulair--continue with Allergist and ask about possiblity of allergy shots--to be self-administered.

## 2014-05-21 NOTE — Patient Instructions (Addendum)
Upper Respiratory Infection, Adult An upper respiratory infection (URI) is also known as the common cold. It is often caused by a type of germ (virus). Colds are easily spread (contagious). You can pass it to others by kissing, coughing, sneezing, or drinking out of the same glass. Usually, you get better in 1 or 2 weeks.  HOME CARE   Only take medicine as told by your doctor.  Use a warm mist humidifier or breathe in steam from a hot shower.  Drink enough water and fluids to keep your pee (urine) clear or pale yellow.  Get plenty of rest.  Return to work when your temperature is back to normal or as told by your doctor. You may use a face mask and wash your hands to stop your cold from spreading. GET HELP RIGHT AWAY IF:   After the first few days, you feel you are getting worse.  You have questions about your medicine.  You have chills, shortness of breath, or brown or red spit (mucus).  You have yellow or brown snot (nasal discharge) or pain in the face, especially when you bend forward.  You have a fever, puffy (swollen) neck, pain when you swallow, or white spots in the back of your throat.  You have a bad headache, ear pain, sinus pain, or chest pain.  You have a high-pitched whistling sound when you breathe in and out (wheezing).  You have a lasting cough or cough up blood.  You have sore muscles or a stiff neck. MAKE SURE YOU:   Understand these instructions.  Will watch your condition.  Will get help right away if you are not doing well or get worse. Document Released: 01/25/2008 Document Revised: 10/31/2011 Document Reviewed: 11/13/2013 [facility] Patient Information 2015 [place]. This information is not intended to replace advice given to you by your health care provider. Make sure you discuss any questions you have with your health care provider. Eczema Eczema, also called atopic dermatitis, is a skin disorder that causes inflammation of the skin. It causes  a red rash and dry, scaly skin. The skin becomes very itchy. Eczema is generally worse during the cooler winter months and often improves with the warmth of summer. Eczema usually starts showing signs in infancy. Some children outgrow eczema, but it may last through adulthood.  CAUSES  The exact cause of eczema is not known, but it appears to run in families. People with eczema often have a family history of eczema, allergies, asthma, or hay fever. Eczema is not contagious. Flare-ups of the condition may be caused by:   Contact with something you are sensitive or allergic to.   Stress. SIGNS AND SYMPTOMS  Dry, scaly skin.   Red, itchy rash.   Itchiness. This may occur before the skin rash and may be very intense.  DIAGNOSIS  The diagnosis of eczema is usually made based on symptoms and medical history. TREATMENT  Eczema cannot be cured, but symptoms usually can be controlled with treatment and other strategies. A treatment plan might include:  Controlling the itching and scratching.   Use over-the-counter antihistamines as directed for itching. This is especially useful at night when the itching tends to be worse.   Use over-the-counter steroid creams as directed for itching.   Avoid scratching. Scratching makes the rash and itching worse. It may also result in a skin infection (impetigo) due to a break in the skin caused by scratching.   Keeping the skin well moisturized with creams every  day. This will seal in moisture and help prevent dryness. Lotions that contain alcohol and water should be avoided because they can dry the skin.   Limiting exposure to things that you are sensitive or allergic to (allergens).   Recognizing situations that cause stress.   Developing a plan to manage stress.  HOME CARE INSTRUCTIONS   Only take over-the-counter or prescription medicines as directed by your health care provider.   Do not use anything on the skin without checking  with your health care provider.   Keep baths or showers short (5 minutes) in warm (not hot) water. Use mild cleansers for bathing. These should be unscented. You may add nonperfumed bath oil to the bath water. It is best to avoid soap and bubble bath.   Immediately after a bath or shower, when the skin is still damp, apply a moisturizing ointment to the entire body. This ointment should be a petroleum ointment. This will seal in moisture and help prevent dryness. The thicker the ointment, the better. These should be unscented.   Keep fingernails cut short. Children with eczema may need to wear soft gloves or mittens at night after applying an ointment.   Dress in clothes made of cotton or cotton blends. Dress lightly, because heat increases itching.   A child with eczema should stay away from anyone with fever blisters or cold sores. The virus that causes fever blisters (herpes simplex) can cause a serious skin infection in children with eczema. SEEK MEDICAL CARE IF:   Your itching interferes with sleep.   Your rash gets worse or is not better within 1 week after starting treatment.   You see pus or soft yellow scabs in the rash area.   You have a fever.   You have a rash flare-up after contact with someone who has fever blisters.  Document Released: 08/05/2000 Document Revised: 05/29/2013 Document Reviewed: 03/11/2013 [facility] Patient Information 2015 [place], [place]. This information is not intended to replace advice given to you by your health care provider. Make sure you discuss any questions you have with your health care provider.

## 2014-05-23 ENCOUNTER — Telehealth: Payer: Self-pay | Admitting: Family Medicine

## 2014-05-23 MED ORDER — GUAIFENESIN ER 600 MG PO TB12: 600.0000 mg | ORAL_TABLET | Freq: Two times a day (BID) | ORAL | Status: DC | PRN

## 2014-05-23 NOTE — Telephone Encounter (Signed)
Pt called because the prescription of Mucinex is not covered by Medicaid and the patient is requesting another medication that would be covered by medicaid. jw

## 2014-05-23 NOTE — Telephone Encounter (Addendum)
She can purchase guaifenesin over the counter (which is the active ingredient in brand name mucinex). I will also send one in, in case it makes it less expensive.  Leona SingletonMaria T Athelene Hursey, MD

## 2014-05-27 NOTE — Telephone Encounter (Signed)
Tried to call patient to inform of message from MD but phone only rang no VM.  Please inform her that a new prescription was sent to pharmacy.  If it is not covered she might have to purchase something OTC.  Can ask pharmacist what is covered by her insurance also.  Thanks Limited BrandsJazmin Hartsell,CMA

## 2014-05-31 ENCOUNTER — Telehealth: Payer: Self-pay | Admitting: Family Medicine

## 2014-05-31 NOTE — Telephone Encounter (Signed)
[facility]  Patient calls reporting that she feels as though food is sitting on top of her stomach. She notes every time she eats she gets the sensation that the food is sitting in her stomach then she has to vomit it up. She notes she has not had a BM in a week. She notes this has happened before. She states she is able to keep fluid down. She is drinking lots of water. She has NBNB vomit. She notes mild cramping in her abdomen. She denies fevers. She asked for medication to help with this. I discussed that it is difficult for me to say what is causing this issue without examining her and that I would not prescribe medication over the phone. I advised that she should be seen at an urgent care today for further evaluation. She should follow-up in our office early next week for follow-up of her urgent care visit.   Marikay AlarEric Dorlene Footman, MD [facility] PGY[facility]

## 2014-06-13 ENCOUNTER — Other Ambulatory Visit: Payer: Self-pay | Admitting: *Deleted

## 2014-06-16 MED ORDER — RANITIDINE HCL 150 MG PO CAPS: 150.0000 mg | ORAL_CAPSULE | Freq: Every evening | ORAL | Status: DC

## 2014-06-17 ENCOUNTER — Telehealth: Payer: Self-pay | Admitting: Home Health Services

## 2014-06-17 NOTE — Telephone Encounter (Signed)
LVM to schedule flu shot appointment.  

## 2014-06-25 ENCOUNTER — Encounter: Payer: Self-pay | Admitting: Family Medicine

## 2014-06-29 ENCOUNTER — Encounter (HOSPITAL_COMMUNITY): Payer: Self-pay | Admitting: Emergency Medicine

## 2014-06-29 ENCOUNTER — Emergency Department (HOSPITAL_COMMUNITY)
Admission: EM | Admit: 2014-06-29 | Discharge: 2014-06-29 | Disposition: A | Discharge status: Home / Self Care | Payer: Medicaid Other | Attending: Emergency Medicine | Admitting: Emergency Medicine

## 2014-06-29 ENCOUNTER — Emergency Department (HOSPITAL_COMMUNITY): Payer: Medicaid Other

## 2014-06-29 DIAGNOSIS — Z3202 Encounter for pregnancy test, result negative: Secondary | ICD-10-CM | POA: Insufficient documentation

## 2014-06-29 DIAGNOSIS — I10 Essential (primary) hypertension: Secondary | ICD-10-CM | POA: Diagnosis not present

## 2014-06-29 DIAGNOSIS — Z7951 Long term (current) use of inhaled steroids: Secondary | ICD-10-CM | POA: Insufficient documentation

## 2014-06-29 DIAGNOSIS — R42 Dizziness and giddiness: Secondary | ICD-10-CM | POA: Diagnosis not present

## 2014-06-29 DIAGNOSIS — Z79899 Other long term (current) drug therapy: Secondary | ICD-10-CM | POA: Insufficient documentation

## 2014-06-29 DIAGNOSIS — Z720 Tobacco use: Secondary | ICD-10-CM | POA: Insufficient documentation

## 2014-06-29 DIAGNOSIS — Z91040 Latex allergy status: Secondary | ICD-10-CM | POA: Diagnosis not present

## 2014-06-29 HISTORY — DX: Essential (primary) hypertension: I10

## 2014-06-29 LAB — CBC WITH DIFFERENTIAL/PLATELET
BASOS ABS: 0 10*3/uL (ref 0.0–0.1)
BASOS PCT: 0 % (ref 0–1)
EOS PCT: 1 % (ref 0–5)
Eosinophils Absolute: 0.1 10*3/uL (ref 0.0–0.7)
HCT: 37 % (ref 36.0–46.0)
Hemoglobin: 12.4 g/dL (ref 12.0–15.0)
Lymphocytes Relative: 31 % (ref 12–46)
Lymphs Abs: 3.8 10*3/uL (ref 0.7–4.0)
MCH: 23.5 pg — AB (ref 26.0–34.0)
MCHC: 33.5 g/dL (ref 30.0–36.0)
MCV: 70.2 fL — AB (ref 78.0–100.0)
Monocytes Absolute: 0.9 10*3/uL (ref 0.1–1.0)
Monocytes Relative: 7 % (ref 3–12)
NEUTROS PCT: 60 % (ref 43–77)
Neutro Abs: 7.2 10*3/uL (ref 1.7–7.7)
Platelets: 254 10*3/uL (ref 150–400)
RBC: 5.27 MIL/uL — ABNORMAL HIGH (ref 3.87–5.11)
RDW: 14.1 % (ref 11.5–15.5)
WBC: 12 10*3/uL — AB (ref 4.0–10.5)

## 2014-06-29 LAB — URINALYSIS, ROUTINE W REFLEX MICROSCOPIC
Bilirubin Urine: NEGATIVE
GLUCOSE, UA: NEGATIVE mg/dL
Hgb urine dipstick: NEGATIVE
Ketones, ur: NEGATIVE mg/dL
LEUKOCYTES UA: NEGATIVE
Nitrite: NEGATIVE
PROTEIN: NEGATIVE mg/dL
Specific Gravity, Urine: 1.005 (ref 1.005–1.030)
Urobilinogen, UA: 0.2 mg/dL (ref 0.0–1.0)
pH: 6.5 (ref 5.0–8.0)

## 2014-06-29 LAB — BASIC METABOLIC PANEL
Anion gap: 15 (ref 5–15)
BUN: 10 mg/dL (ref 6–23)
CALCIUM: 9.7 mg/dL (ref 8.4–10.5)
CO2: 24 mEq/L (ref 19–32)
Chloride: 102 mEq/L (ref 96–112)
Creatinine, Ser: 0.8 mg/dL (ref 0.50–1.10)
Glucose, Bld: 89 mg/dL (ref 70–99)
POTASSIUM: 4.1 meq/L (ref 3.7–5.3)
SODIUM: 141 meq/L (ref 137–147)

## 2014-06-29 LAB — PREGNANCY, URINE: PREG TEST UR: NEGATIVE

## 2014-06-29 MED ORDER — MECLIZINE HCL 12.5 MG PO TABS
12.5000 mg | ORAL_TABLET | Freq: Three times a day (TID) | ORAL | Status: DC | PRN
Start: 2014-06-29 — End: 2015-03-16

## 2014-06-29 NOTE — ED Notes (Addendum)
Pt reports x 1 month with dizziness. Pt seen by PMD and told she had sinus infection. Pt talking in complete sentences without difficulty.

## 2014-06-29 NOTE — ED Notes (Signed)
Pt ambulated without difficulty. Pt stated, "I feel a little light-headed, but not dizzy" while walking.

## 2014-06-29 NOTE — Telephone Encounter (Signed)
Outside line call Dizziness for the last several hours Drank plenty of fluid  Legs felt numb  Feels different than a migraine  Denies stress  Advised to call sister to watch son and go to ED or UC May be related to HTN vs intracranial pathology, difficult to say without formal eval [facility], MD

## 2014-06-29 NOTE — Discharge Instructions (Signed)
Dizziness Take the dizziness medication as prescribed. Follow up with your doctor. Return to the ED if you develop new or worsening symptoms. Dizziness is a common problem. It is a feeling of unsteadiness or light-headedness. You may feel like you are about to faint. Dizziness can lead to injury if you stumble or fall. A person of any age group can suffer from dizziness, but dizziness is more common in older adults. CAUSES  Dizziness can be caused by many different things, including:  Middle ear problems.  Standing for too long.  Infections.  An allergic reaction.  Aging.  An emotional response to something, such as the sight of blood.  Side effects of medicines.  Tiredness.  Problems with circulation or blood pressure.  Excessive use of alcohol or medicines, or illegal drug use.  Breathing too fast (hyperventilation).  An irregular heart rhythm (arrhythmia).  A low red blood cell count (anemia).  Pregnancy.  Vomiting, diarrhea, fever, or other illnesses that cause body fluid loss (dehydration).  Diseases or conditions such as Parkinson's disease, high blood pressure (hypertension), diabetes, and thyroid problems.  Exposure to extreme heat. DIAGNOSIS  Your health care provider will ask about your symptoms, perform a physical exam, and perform an electrocardiogram (ECG) to record the electrical activity of your heart. Your health care provider may also perform other heart or blood tests to determine the cause of your dizziness. These may include:  Transthoracic echocardiogram (TTE). During echocardiography, sound waves are used to evaluate how blood flows through your heart.  Transesophageal echocardiogram (TEE).  Cardiac monitoring. This allows your health care provider to monitor your heart rate and rhythm in real time.  Holter monitor. This is a portable device that records your heartbeat and can help diagnose heart arrhythmias. It allows your health care provider to  track your heart activity for several days if needed.  Stress tests by exercise or by giving medicine that makes the heart beat faster. TREATMENT  Treatment of dizziness depends on the cause of your symptoms and can vary greatly. HOME CARE INSTRUCTIONS   Drink enough fluids to keep your urine clear or pale yellow. This is especially important in very hot weather. In older adults, it is also important in cold weather.  Take your medicine exactly as directed if your dizziness is caused by medicines. When taking blood pressure medicines, it is especially important to get up slowly.  Rise slowly from chairs and steady yourself until you feel okay.  In the morning, first sit up on the side of the bed. When you feel okay, stand slowly while holding onto something until you know your balance is fine.  Move your legs often if you need to stand in one place for a long time. Tighten and relax your muscles in your legs while standing.  Have someone stay with you for 1-2 days if dizziness continues to be a problem. Do this until you feel you are well enough to stay alone. Have the person call your health care provider if he or she notices changes in you that are concerning.  Do not drive or use heavy machinery if you feel dizzy.  Do not drink alcohol. SEEK IMMEDIATE MEDICAL CARE IF:   Your dizziness or light-headedness gets worse.  You feel nauseous or vomit.  You have problems talking, walking, or using your arms, hands, or legs.  You feel weak.  You are not thinking clearly or you have trouble forming sentences. It may take a friend or family member  to notice this.  You have chest pain, abdominal pain, shortness of breath, or sweating.  Your vision changes.  You notice any bleeding.  You have side effects from medicine that seems to be getting worse rather than better. MAKE SURE YOU:   Understand these instructions.  Will watch your condition.  Will get help right away if you are  not doing well or get worse. Document Released: 02/01/2001 Document Revised: 08/13/2013 Document Reviewed: 02/25/2011 [facility] Patient Information 2015 [place], [place]. This information is not intended to replace advice given to you by your health care provider. Make sure you discuss any questions you have with your health care provider.

## 2014-06-29 NOTE — ED Provider Notes (Signed)
CSN: [id number]     Arrival date & time 06/29/14  1443 History   First MD Initiated Contact with Patient 06/29/14 1518     Chief Complaint  Patient presents with  . Dizziness     (Consider location/radiation/quality/duration/timing/severity/associated sxs/prior Treatment) HPI Comments: Patient presents with a one-month history of intermittent dizziness described as room spinning sensation that comes and goes. She states she has not addressed this with her doctor. Episodes usually last a few seconds but she had an episode today that lasted 90 minutes. She is not sure what brings it on. It is gone now. Now she complains of a gradual onset right-sided headache similar to her previous migraines. Denies any fevers, chills, nausea vomiting. No chest pain or shortness of breath. No focal weakness, numbness or tingling.no bowel or bladder incontinence. Dizziness does not come on with position change.  The history is provided by the patient.    Past Medical History  Diagnosis Date  . Hypertension    History reviewed. No pertinent past surgical history. No family history on file. History  Substance Use Topics  . Smoking status: Current Every Day Smoker -- 0.50 packs/day for 19 years    Types: Cigarettes  . Smokeless tobacco: Not on file  . Alcohol Use: No   OB History    No data available     Review of Systems  Constitutional: Negative for fever, activity change and appetite change.  Eyes: Negative for visual disturbance.  Respiratory: Negative for cough, chest tightness and shortness of breath.   Cardiovascular: Negative for chest pain.  Gastrointestinal: Negative for nausea and abdominal pain.  Genitourinary: Negative for dysuria, hematuria and vaginal discharge.  Musculoskeletal: Negative for myalgias and arthralgias.  Skin: Negative for rash.  Neurological: Positive for dizziness, light-headedness and headaches. Negative for weakness.  A complete 10 system review of systems was  obtained and all systems are negative except as noted in the HPI and PMH. \    Allergies  Aripiprazole; Escitalopram oxalate; and Latex  Home Medications   Prior to Admission medications   Medication Sig Start Date End Date Taking? Authorizing Provider  cyclobenzaprine (FLEXERIL) 10 MG tablet Take 1 tablet (10 mg total) by mouth at bedtime as needed for muscle spasms. 05/21/14  Yes Reva Bores, MD  EPINEPHrine (EPIPEN) 0.3 mg/0.3 mL IJ SOAJ injection Inject 0.3 mLs (0.3 mg total) into the muscle once. 02/20/14  Yes Leona Singleton, MD  guaiFENesin (MUCINEX) 600 MG 12 hr tablet Take 1 tablet (600 mg total) by mouth 2 (two) times daily as needed. Patient taking differently: Take 600 mg by mouth 2 (two) times daily as needed for cough.  05/23/14  Yes Leona Singleton, MD  hydrochlorothiazide (HYDRODIURIL) 12.5 MG tablet Take 1 tablet (12.5 mg total) by mouth daily. Patient taking differently: Take 12.5 mg by mouth every other day.  09/27/13  Yes Sanjuana Letters, MD  hydrOXYzine (ATARAX/VISTARIL) 10 MG tablet Take 10mg  nightly at 7pm if itching is severe. May cause drowsiness. 01/09/14  Yes Leona Singleton, MD  ipratropium (ATROVENT) 0.03 % nasal spray Place 2 sprays into the nose every 12 (twelve) hours. 03/29/13  Yes Rodolph Bong, MD  loratadine (CLARITIN) 10 MG tablet Take 1 tablet (10 mg total) by mouth daily. Patient taking differently: Take 10 mg by mouth daily as needed for allergies.  02/12/14  Yes Leona Singleton, MD  lurasidone (LATUDA) 40 MG TABS tablet Take 40 mg by mouth at bedtime.  Yes Historical Provider, MD  montelukast (SINGULAIR) 10 MG tablet Take 1 tablet (10 mg total) by mouth at bedtime. 05/21/14  Yes Reva Boresanya S Pratt, MD  Olopatadine HCl 0.2 % SOLN Apply 1 drop to eye daily. Into each affected eye. Patient taking differently: Place 1 drop into both eyes daily as needed. For itching 02/24/14  Yes Leona SingletonMaria T Thekkekandam, MD  ondansetron (ZOFRAN) 4 MG tablet Take 1  tablet (4 mg total) by mouth every 8 (eight) hours as needed for nausea or vomiting. 07/25/13  Yes Leona SingletonMaria T Thekkekandam, MD  pantoprazole (PROTONIX) 40 MG tablet Take 1 tablet (40 mg total) by mouth daily. 11/26/13  Yes Leona SingletonMaria T Thekkekandam, MD  ranitidine (ZANTAC) 150 MG capsule Take 1 capsule (150 mg total) by mouth every evening. 06/16/14  Yes Leona SingletonMaria T Thekkekandam, MD  traMADol (ULTRAM) 50 MG tablet Take 1 tablet (50 mg total) by mouth daily as needed. 05/21/14  Yes Reva Boresanya S Pratt, MD  triamcinolone (NASACORT AQ) 55 MCG/ACT AERO nasal inhaler Place 2 sprays into the nose daily. 05/21/14  Yes Reva Boresanya S Pratt, MD  clotrimazole (LOTRIMIN) 1 % cream Apply topically 2 (two) times daily. Patient not taking: Reported on 06/29/2014 04/17/13   Leona SingletonMaria T Thekkekandam, MD  meclizine (ANTIVERT) 12.5 MG tablet Take 1 tablet (12.5 mg total) by mouth 3 (three) times daily as needed for dizziness. 06/29/14   Glynn OctaveStephen Mikiah Durall, MD   BP 120/68 mmHg  Pulse 74  Temp(Src) 97.5 F (36.4 C) (Oral)  Resp 16  Ht 5\' 9"  (1.753 m)  Wt 230 lb (104.327 kg)  BMI 33.95 kg/m2  SpO2 99%  LMP 06/06/2014 Physical Exam  Constitutional: She is oriented to person, place, and time. She appears well-developed and well-nourished. No distress.  HENT:  Head: Normocephalic and atraumatic.  Mouth/Throat: Oropharynx is clear and moist. No oropharyngeal exudate.  Eyes: Conjunctivae and EOM are normal. Pupils are equal, round, and reactive to light.  Neck: Normal range of motion. Neck supple.  No meningismus.  Cardiovascular: Normal rate, regular rhythm, normal heart sounds and intact distal pulses.   No murmur heard. Pulmonary/Chest: Effort normal and breath sounds normal. No respiratory distress.  Abdominal: Soft. There is no tenderness. There is no rebound and no guarding.  Musculoskeletal: Normal range of motion. She exhibits no edema or tenderness.  Neurological: She is alert and oriented to person, place, and time. No cranial nerve  deficit. She exhibits normal muscle tone. Coordination normal.  No ataxia on finger to nose bilaterally. No pronator drift. 5/5 strength throughout. CN 2-12 intact. Negative Romberg. Equal grip strength. Sensation intact. Gait is normal.  Test of skew negative. Head impulse testing negative. No nystagmus.  Skin: Skin is warm.  Psychiatric: She has a normal mood and affect. Her behavior is normal.  Nursing note and vitals reviewed.   ED Course  Procedures (including critical care time) Labs Review Labs Reviewed  CBC WITH DIFFERENTIAL - Abnormal; Notable for the following:    WBC 12.0 (*)    RBC 5.27 (*)    MCV 70.2 (*)    MCH 23.5 (*)    All other components within normal limits  PREGNANCY, URINE  URINALYSIS, ROUTINE W REFLEX MICROSCOPIC  BASIC METABOLIC PANEL    Imaging Review Mr Brain Wo Contrast  06/29/2014   CLINICAL DATA:  Dizziness over the last several hr. Numbness in the legs. History of hypertension and migraine headaches.  EXAM: MRI HEAD WITHOUT CONTRAST  TECHNIQUE: Multiplanar, multiecho pulse sequences of the brain  and surrounding structures were obtained without intravenous contrast.  COMPARISON:  None.  FINDINGS: The diffusion-weighted images demonstrate no evidence for acute or subacute infarction. No hemorrhage or mass lesion is present. The ventricles are of normal size. No significant extra-axial fluid collection is present. There is no significant white matter disease.  Flow is present in the major intracranial arteries. The globes and orbits are intact. The paranasal sinuses and mastoid air cells are clear. Midline structures are within normal limits. Decreased T1 marrow signal is compatible with known anemia.  IMPRESSION: 1. Normal MRI of the brain. 2. Decreased T1 marrow signal compatible with mild anemia.   Electronically Signed   By: Gennette Pachris  Mattern M.D.   On: 06/29/2014 16:53     EKG Interpretation   Date/Time:  Sunday June 29 2014 15:33:51 EST Ventricular  Rate:  79 PR Interval:  146 QRS Duration: 84 QT Interval:  374 QTC Calculation: 429 R Axis:   18 Text Interpretation:  Sinus rhythm No previous ECGs available Confirmed by  Miraj Truss  MD, Marielouise Amey [id number]) on 06/29/2014 3:43:45 PM      MDM   Final diagnoses:  Dizziness  Vertigo  recurrent episodes of vertigo over the past month with nausea. Neurologically intact at this time. Normal gait. Negative Romberg.  Obtain imaging to rule out any demyelinating disease.  UA negative. HCG negative. Orthostatics negative.  Suspect peripheral vertigo. Patient ambulatory without issue. She is tolerating by mouth. No neurological deficits. We'll treat with meclizine and PCP follow-up. Return precautions discussed.   Glynn OctaveStephen Tephanie Escorcia, MD 06/30/14 [id number]

## 2014-07-14 ENCOUNTER — Ambulatory Visit: Payer: Medicaid Other | Admitting: Family Medicine

## 2014-07-29 ENCOUNTER — Ambulatory Visit (INDEPENDENT_AMBULATORY_CARE_PROVIDER_SITE_OTHER): Payer: Medicaid Other | Admitting: Family Medicine

## 2014-07-29 ENCOUNTER — Ambulatory Visit (INDEPENDENT_AMBULATORY_CARE_PROVIDER_SITE_OTHER): Payer: Medicaid Other | Admitting: *Deleted

## 2014-07-29 ENCOUNTER — Encounter: Payer: Self-pay | Admitting: Family Medicine

## 2014-07-29 VITALS — BP 149/87 | HR 85 | Temp 98.5°F | Ht 69.0 in | Wt 231.0 lb

## 2014-07-29 DIAGNOSIS — Z23 Encounter for immunization: Secondary | ICD-10-CM

## 2014-07-29 DIAGNOSIS — K5900 Constipation, unspecified: Secondary | ICD-10-CM | POA: Diagnosis not present

## 2014-07-29 DIAGNOSIS — K5909 Other constipation: Secondary | ICD-10-CM | POA: Insufficient documentation

## 2014-07-29 DIAGNOSIS — R42 Dizziness and giddiness: Secondary | ICD-10-CM | POA: Diagnosis not present

## 2014-07-29 MED ORDER — POLYETHYLENE GLYCOL 3350 17 G PO PACK: 17.0000 g | PACK | Freq: Two times a day (BID) | ORAL | Status: DC

## 2014-07-29 MED ORDER — PRAMOXINE HCL 1 % RE FOAM: 1.0000 "application " | Freq: Three times a day (TID) | RECTAL | Status: DC | PRN

## 2014-07-29 MED ORDER — DOCUSATE SODIUM 100 MG PO CAPS: 100.0000 mg | ORAL_CAPSULE | Freq: Two times a day (BID) | ORAL | Status: DC

## 2014-07-29 NOTE — Assessment & Plan Note (Addendum)
Present~2 months, intermittent, worse with sinus congestion episodically. Recent ED visit with normal MRI and EKG. Neg Dix-Hallpike test in clinic (no nystagmus) though subjective nystagmus and reproduces some dizzy sensation. No syncope. Mild sinus pressure on palpation and TMs with dull appearance suggestive of chronic irritation. - Continue nasal steroid, singulair, and nasal saline daily to tx sinus congestion. - Meclizine PRN which helped. - Stay hydrated. - F/u if not improving in 2 weeks and can refer to PT or ENT at that time.

## 2014-07-29 NOTE — Assessment & Plan Note (Signed)
H/o BM 2x/week, now daily but very hard and painful. No blood. Likely hemorrhoids present, mild improvement with OTC hemorrhoid topical med. LUQ abdominal pain likely due to constipation/gas. - Colace BID, miralax daily (increase to BID in 2 days if not stooling reguarly) with goal of soft daily BM. - Proctofoam PRN. - Hydrate. - F/u in 2 weeks if not improving or sooner PRN.

## 2014-07-29 NOTE — Progress Notes (Signed)
Patient ID: [name], female   DOB: [birth year], 38 y.o.   MRN: [id number] Subjective:   CC: F/u dizziness and complaint of constipation  HPI:   Follow up dizziness Patient presents for f/u of ED visit for vertigo which started about 2 months ago and was intermittent. She thought sinus congestion was contributing. Has been having headaches that felt like her migraines that she also relates to sinuses. Has been taking meclizine as needed and it is working when she uses it. She states when she tried to follow a finger in the ED, her eyes move funny. Nausea and vomiting, head spinning sensation, no syncope. Has in the past had to call ambulance for this and glucose was normal. Thought due to decreased PO but no improvement when eats right. Allergist suggested nasal saline which she does along with nasocort. Onset if get up too fast or bend over. No chest pain. Occ dyspnea related to congestion. Recent ear pain that is improving.   Constipation  A couple days, with left upper abdomen pain "so bad" and crampy. Four days constant. Thinks she has hemorrhoids because of pain with wiping and stooling, thinks she is straining. OTC hemorrhoid foam helps some. Having lots of hard BM. Suppositories to soften it. Same diet lately. No fevers. Some chills. Some vomiting but only when dizzy. Bloating.   Review of Systems - Per HPI.   PMH - vulvar dermatitis, tobacco abuse, lumbar radiculopathy, obesity, migraine without aura, HTN, anxiety/depression, GERD, season allergic rhinitis Smoking status: Current daily, wants to cut back    Objective:  Physical Exam BP 149/87 mmHg  Pulse 85  Temp(Src) 98.5 F (36.9 C) (Oral)  Ht 5\' 9"  (1.753 m)  Wt 231 lb (104.781 kg)  BMI 34.10 kg/m2  LMP 07/07/2014 (Approximate) GEN: NAD PULM: Normal effort ABD: S/mildly tender left upper quadrant/ND, no suprapubic tenderness NEURO: Awake, alert, no focal deficit, negative dix-hallpike HEENT: AT/[place], sclera clear,  EOMI, TMs dull bilaterally with no erythema, sinus pressure reproduced with palpation to frontal and maxillary sinuses    Assessment:     [name] is a 38 y.o. female here for f/u of dizziness ED visit and with constipation.    Plan:     # See problem list and after visit summary for problem-specific plans.   # Health Maintenance: Flu shot today  Follow-up: Follow up in 2 weeks PRN lack of improvement.   Leona SingletonMaria T Shamarion Coots, MD [facility]

## 2014-07-29 NOTE — Patient Instructions (Signed)
Good to see you!  I think your dizziness is related to your sinus congestion.  Take the nasal steroid (nasocort) spray, nasal saline, AND singulair DAILY to help keep your nasal passages as clear as possible as they are contributing to lots of your symptoms likely. If this is not better in 2 weeks come back to see us.  For your stomach pain and rectal pain, this is likely from constipation. Stay hydrated and use colace twice daily and miralax DAILY until having regular soft stools every day. You can increase miralax to 2x per day after a few days if not improved. Follow up with us in 2 weeks if NOT better.  If you have worsening of symptoms, fainting, fever, or rectal bleeding or other concerns, seek immediate care.  Best,  Leona SingletonMaria T Jahzaria Vary, MD

## 2014-08-09 ENCOUNTER — Other Ambulatory Visit: Payer: Self-pay | Admitting: Family Medicine

## 2014-08-19 ENCOUNTER — Telehealth: Payer: Self-pay | Admitting: Nurse Practitioner

## 2014-08-19 DIAGNOSIS — R12 Heartburn: Secondary | ICD-10-CM

## 2014-08-19 MED ORDER — OMEPRAZOLE 20 MG PO CPDR: 20.0000 mg | DELAYED_RELEASE_CAPSULE | Freq: Two times a day (BID) | ORAL | Status: DC

## 2014-08-19 NOTE — Progress Notes (Signed)
We are sorry that you are not feeling well.  Here is how we plan to help!  Based on what you shared with me it looks like you most likely have Gastroesophageal Reflux Disease (GERD)  Gastroesophageal reflux disease (GERD) happens when acid from your stomach flows up into the esophagus.  When acid comes in contact with the esophagus, the acid causes sorenss (inflammation) in the esophagus.  Over time, GERD may create small holes (ulcers) in the lining of the esophagus.  I have prescribed Omeprazole, a Protein Pump inhibitor, 20 mg daily until you follow up with a provider.  Your symptoms should improve in the next day or two.  You can use antacids as needed until symptoms resolve.  Call us if your heartburn worsens, you have trouble swallowing, weight loss, spitting up blood or recurrent vomiting.  Home Care:  May include lifestyle changes such as weight loss, quitting smoking and alcohol consumption  Avoid foods and drinks that make your symptoms worse, such as:  Caffeine or alcoholic drinks  Chocolate  Peppermint or mint flavorings  Garlic and onions  Spicy foods  Citrus fruits, such as oranges, lemons, or limes  Tomato-based foods such as sauce, chili, salsa and pizza  Fried and fatty foods  Avoid lying down for 3 hours prior to your bedtime or prior to taking a nap  Eat small, frequent meals instead of a large meals  Wear loose-fitting clothing.  Do not wear anything tight around your waist that causes pressure on your stomach.  Raise the head of your bed 6 to 8 inches with wood blocks to help you sleep.  Extra pillows will not help.  Seek Help Right Away If:  You have pain in your arms, neck, jaw, teeth or back  Your pain increases or changes in intensity or duration  You develop nausea, vomiting or sweating (diaphoresis)  You develop shortness of breath or you faint  Your vomit is green, yellow, black or looks like coffee grounds or blood  Your stool is red,  bloody or black  These symptoms could be signs of other problems, such as heart disease, gastric bleeding or esophageal bleeding.  Make sure you :  Understand these instructions.  Will watch your condition.  Will get help right away if you are not doing well or get worse.  Your e-visit answers were reviewed by a board certified advanced clinical practitioner to complete your personal care plan.  Depending on the condition, your plan could have included both over the counter or prescription medications.  If there is a problem please reply  once you have received a response from your provider.  Your safety is important to us.  If you have drug allergies check your prescription carefully.    You can use MyChart to ask questions about today's visit, request a non-urgent call back, or ask for a work or school excuse.  You will get an e-mail in the next two days asking about your experience.  I hope that your e-visit has been valuable and will speed your recovery. Thank you for using e-visits.

## 2014-08-29 ENCOUNTER — Telehealth: Payer: Self-pay | Admitting: Family Medicine

## 2014-08-29 NOTE — Telephone Encounter (Signed)
Return call to pt regarding ear pain and itching.  Pt stated it has been going on for a week.  She took Tramadol for pain and some antihistamine to help with itching.  The pain and itching will go away for a while but returns.  Offered an appt for today, but declined to scheduling conflict.  Pt advised she should have a provider to take evaluate her ear.  Pt stated understanding, appt 09/01/14 at 1:30 PM.  Pt advised to go to urgent care if pain and itching gets worse.  Clovis PuMartin, Tamika L, RN

## 2014-08-29 NOTE — Telephone Encounter (Signed)
Ears are itching and painful.  Need advice about whether she need to come in or get something called in.  Having issue about a week.

## 2014-09-01 ENCOUNTER — Ambulatory Visit: Payer: Medicaid Other | Admitting: Family Medicine

## 2014-09-11 ENCOUNTER — Ambulatory Visit (INDEPENDENT_AMBULATORY_CARE_PROVIDER_SITE_OTHER): Payer: Medicaid Other | Admitting: Family Medicine

## 2014-09-11 ENCOUNTER — Encounter: Payer: Self-pay | Admitting: Family Medicine

## 2014-09-11 ENCOUNTER — Ambulatory Visit: Payer: Medicaid Other | Admitting: Family Medicine

## 2014-09-11 VITALS — BP 131/89 | HR 91 | Temp 97.8°F | Ht 69.0 in | Wt 227.4 lb

## 2014-09-11 DIAGNOSIS — H6093 Unspecified otitis externa, bilateral: Secondary | ICD-10-CM | POA: Insufficient documentation

## 2014-09-11 MED ORDER — NEOMYCIN-POLYMYXIN-HC 3.5-10000-1 OT SOLN: 5.0000 [drp] | Freq: Four times a day (QID) | OTIC | Status: DC

## 2014-09-11 MED ORDER — NEOMYCIN-POLYMYXIN-HC 3.5-10000-1 OT SOLN: 4.0000 [drp] | Freq: Four times a day (QID) | OTIC | Status: DC

## 2014-09-11 NOTE — Patient Instructions (Signed)
I believe you have a right external auditory canal ear infection. I will prescribe you some eardrops today, please use this in both ears as the left ear looks mildly infected. I will place a referral to ENT today, mainly because this has been an ongoing chronic issue with your ears, to rule out that some of the irritation and drainage could be from fungal infection. Continue all of your other nasal drops, and antihistamines as prescribed.

## 2014-09-11 NOTE — Assessment & Plan Note (Addendum)
Patient with otitis externa, right greater than left today. Take a chronic history of ear drainage, itching and multiple visits with allergist. - Prescribed Cortisporin otic solution (medicaid insurance FQ not covered) today to be used in bilateral ears 4 drops 4 times a day - In addition I have placed an ENT referral, concern for possible fungal infection/your pathology considering repeated visits and no resolution of symptoms.  - Follow-up in 1-2 weeks, with PCP if no resolution of symptoms. Patient will be called with ENT referral appointment once referral has gone through.

## 2014-09-11 NOTE — Progress Notes (Signed)
Subjective:    Patient ID: [name], female    DOB: 08/30/1975, 39 y.o.   MRN: [id number]  HPI Bilateral ear drainage: Patient presents to same-day clinic for bilateral ear drainage and itchiness. She states that she has no fever, chills, ringing in ears, or other URI like symptoms. Has had a mild headache around her eyes only. This is consistent with her chronic headaches. She has been to allergist, and her PCP office frequently for ear drainage and itching. She states approximately 2 weeks ago her ears started draining more frequently, and the itching increased. Feels it hurts worse when she goes outside. On multiple antihistamines, nasal sprays, and have seen allergist for issue. Today she states her ears feel clogged, like she is a swimming pool all day.  Current every day smoker Past Medical History  Diagnosis Date  . Hypertension    Allergies  Allergen Reactions  . Aripiprazole     REACTION: Disoriented.  . Escitalopram Oxalate     REACTION: disoriented.  . Latex Itching     Medication List       This list is accurate as of: 09/11/14 10:49 AM.  Always use your most recent med list.               clotrimazole 1 % cream  Commonly known as:  LOTRIMIN  Apply topically 2 (two) times daily.     cyclobenzaprine 10 MG tablet  Commonly known as:  FLEXERIL  Take 1 tablet (10 mg total) by mouth at bedtime as needed for muscle spasms.     docusate sodium 100 MG capsule  Commonly known as:  COLACE  Take 1 capsule (100 mg total) by mouth 2 (two) times daily.     EPINEPHrine 0.3 mg/0.3 mL Soaj injection  Commonly known as:  EPIPEN  Inject 0.3 mLs (0.3 mg total) into the muscle once.     guaiFENesin 600 MG 12 hr tablet  Commonly known as:  MUCINEX  Take 1 tablet (600 mg total) by mouth 2 (two) times daily as needed.     hydrochlorothiazide 12.5 MG tablet  Commonly known as:  HYDRODIURIL  Take 1 tablet (12.5 mg total) by mouth daily.     hydrOXYzine 10 MG tablet    Commonly known as:  ATARAX/VISTARIL  Take  nightly at 7pm if itching is severe. May cause drowsiness.     ipratropium 0.03 % nasal spray  Commonly known as:  ATROVENT  Place 2 sprays into the nose every 12 (twelve) hours.     loratadine 10 MG tablet  Commonly known as:  CLARITIN  Take 1 tablet (10 mg total) by mouth daily.     lurasidone 40 MG Tabs tablet  Commonly known as:  LATUDA  Take 40 mg by mouth at bedtime.     meclizine 12.5 MG tablet  Commonly known as:  ANTIVERT  Take 1 tablet (12.5 mg total) by mouth 3 (three) times daily as needed for dizziness.     montelukast 10 MG tablet  Commonly known as:  SINGULAIR  Take 1 tablet (10 mg total) by mouth at bedtime.     neomycin-polymyxin-hydrocortisone otic solution  Commonly known as:  CORTISPORIN  Place 4 drops into both ears 4 (four) times daily.     Olopatadine HCl 0.2 % Soln  Apply 1 drop to eye daily. Into each affected eye.     omeprazole 20 MG capsule  Commonly known as:  PRILOSEC  Take 1 capsule (20  mg total) by mouth 2 (two) times daily before a meal.     ondansetron 4 MG tablet  Commonly known as:  ZOFRAN  Take 1 tablet (4 mg total) by mouth every 8 (eight) hours as needed for nausea or vomiting.     pantoprazole 40 MG tablet  Commonly known as:  PROTONIX  Take 1 tablet (40 mg total) by mouth daily.     polyethylene glycol packet  Commonly known as:  MIRALAX / GLYCOLAX  Take 17 g by mouth 2 (two) times daily. Until stooling regularly     pramoxine 1 % foam  Commonly known as:  PROCTOFOAM  Place 1 application rectally 3 (three) times daily as needed for itching, irritation or hemorrhoids.     ranitidine 150 MG tablet  Commonly known as:  ZANTAC  TAKE 1 TABLET BY MOUTH EVERY DAY     traMADol 50 MG tablet  Commonly known as:  ULTRAM  Take 1 tablet (50 mg total) by mouth daily as needed.     triamcinolone 55 MCG/ACT Aero nasal inhaler  Commonly known as:  NASACORT AQ  Place 2 sprays into the  nose daily.        Review of Systems Per history of present illness    Objective:   Physical Exam BP 131/89 mmHg  Pulse 91  Temp(Src) 97.8 F (36.6 C) (Oral)  Ht 5\' 9"  (1.753 m)  Wt 227 lb 6.4 oz (103.148 kg)  BMI 33.57 kg/m2  LMP 08/22/2014 Gen: Pleasant, African-American female, no acute distress, nontoxic in appearance, well-developed, well-nourished, alert, oriented 3, mildly obese HEENT: AT. [place]. Bilateral TM visualized , cloudy in appearance. Pain with traction on tragus left greater than right. External auditory canal with swelling, narrowing of canal and erythema left greater than right. Bilateral eyes without injections or icterus. MMM. Bilateral nares with no erythema or swelling Throat without erythema or exudates. No lymphadenopathy Hearing screen passed [phone]     Assessment & Plan:

## 2014-11-04 ENCOUNTER — Telehealth: Payer: Self-pay | Admitting: *Deleted

## 2014-11-04 NOTE — Telephone Encounter (Signed)
Left voice message for pt to return nurse call.  Martin, Tamika L, RN  

## 2014-11-04 NOTE — Telephone Encounter (Signed)
Pt left voice message for nurse to call regarding personal issues.  [name], [name] L, RN

## 2014-11-08 ENCOUNTER — Telehealth: Payer: Self-pay | Admitting: Family Medicine

## 2014-11-08 NOTE — Telephone Encounter (Signed)
Family Medicine After hours phone call  Pt calls reporting having restarted blood pressure medicine this past week after going to clinic and having it checked. She says she is having dizziness similar to what she had last November. She is again having nasal congestion similar to November. Dizziness is worse upon standing but goes away after a short while, also comes back randomly while upright. She has not passed out. She has tried her meclizine with some relief. Denies any pain, denies CP, denies SOB, denies numbness/tingling. I advised her that I'd be unable to tell her exactly what is causing her issue over the phone, she has a pharmacy down the road she may go check her blood pressure and if significantly elevated or low will call back. I have scheduled her for a SDA on Monday at 10:30am with Dr. Casper HarrisonStreet. Given return precautions to ED including numbness/tingling, passing out, develops pain or difficulty breathing.  Tawni CarnesAndrew Tamya Denardo, MD 11/08/2014, 1:10 PM PGY-2, [facility]

## 2014-11-10 ENCOUNTER — Ambulatory Visit: Payer: Medicaid Other | Admitting: Family Medicine

## 2014-11-11 ENCOUNTER — Telehealth: Payer: Self-pay | Admitting: Family Medicine

## 2014-11-11 NOTE — Telephone Encounter (Signed)
Pt called because she is having very bad headaches and is groggy. She has made a SDA for 3/23 but wants to know what to do in the meantime. jw

## 2014-11-12 ENCOUNTER — Encounter: Payer: Self-pay | Admitting: Family Medicine

## 2014-11-12 ENCOUNTER — Ambulatory Visit (INDEPENDENT_AMBULATORY_CARE_PROVIDER_SITE_OTHER): Payer: Medicaid Other | Admitting: Family Medicine

## 2014-11-12 VITALS — BP 135/92 | HR 95 | Temp 98.5°F | Ht 69.0 in | Wt 233.6 lb

## 2014-11-12 DIAGNOSIS — J329 Chronic sinusitis, unspecified: Secondary | ICD-10-CM | POA: Insufficient documentation

## 2014-11-12 DIAGNOSIS — J310 Chronic rhinitis: Secondary | ICD-10-CM | POA: Insufficient documentation

## 2014-11-12 DIAGNOSIS — J0110 Acute frontal sinusitis, unspecified: Secondary | ICD-10-CM

## 2014-11-12 MED ORDER — AMOXICILLIN-POT CLAVULANATE 875-125 MG PO TABS: 1.0000 | ORAL_TABLET | Freq: Two times a day (BID) | ORAL | Status: AC

## 2014-11-12 NOTE — Patient Instructions (Signed)
Sinusitis Sinusitis is redness, soreness, and inflammation of the paranasal sinuses. Paranasal sinuses are air pockets within the bones of your face (beneath the eyes, the middle of the forehead, or above the eyes). In healthy paranasal sinuses, mucus is able to drain out, and air is able to circulate through them by way of your nose. However, when your paranasal sinuses are inflamed, mucus and air can become trapped. This can allow bacteria and other germs to grow and cause infection. Sinusitis can develop quickly and last only a short time (acute) or continue over a long period (chronic). Sinusitis that lasts for more than 12 weeks is considered chronic.  CAUSES  Causes of sinusitis include:  Allergies.  Structural abnormalities, such as displacement of the cartilage that separates your nostrils (deviated septum), which can decrease the air flow through your nose and sinuses and affect sinus drainage.  Functional abnormalities, such as when the small hairs (cilia) that line your sinuses and help remove mucus do not work properly or are not present. SIGNS AND SYMPTOMS  Symptoms of acute and chronic sinusitis are the same. The primary symptoms are pain and pressure around the affected sinuses. Other symptoms include:  Upper toothache.  Earache.  Headache.  Bad breath.  Decreased sense of smell and taste.  A cough, which worsens when you are lying flat.  Fatigue.  Fever.  Thick drainage from your nose, which often is green and may contain pus (purulent).  Swelling and warmth over the affected sinuses. DIAGNOSIS  Your health care provider will perform a physical exam. During the exam, your health care provider may:  Look in your nose for signs of abnormal growths in your nostrils (nasal polyps).  Tap over the affected sinus to check for signs of infection.  View the inside of your sinuses (endoscopy) using an imaging device that has a light attached (endoscope). If your health  care provider suspects that you have chronic sinusitis, one or more of the following tests may be recommended:  Allergy tests.  Nasal culture. A sample of mucus is taken from your nose, sent to a lab, and screened for bacteria.  Nasal cytology. A sample of mucus is taken from your nose and examined by your health care provider to determine if your sinusitis is related to an allergy. TREATMENT  Most cases of acute sinusitis are related to a viral infection and will resolve on their own within 10 days. Sometimes medicines are prescribed to help relieve symptoms (pain medicine, decongestants, nasal steroid sprays, or saline sprays).  However, for sinusitis related to a bacterial infection, your health care provider will prescribe antibiotic medicines. These are medicines that will help kill the bacteria causing the infection.  Rarely, sinusitis is caused by a fungal infection. In theses cases, your health care provider will prescribe antifungal medicine. For some cases of chronic sinusitis, surgery is needed. Generally, these are cases in which sinusitis recurs more than 3 times per year, despite other treatments. HOME CARE INSTRUCTIONS   Drink plenty of water. Water helps thin the mucus so your sinuses can drain more easily.  Use a humidifier.  Inhale steam 3 to 4 times a day (for example, sit in the bathroom with the shower running).  Apply a warm, moist washcloth to your face 3 to 4 times a day, or as directed by your health care provider.  Use saline nasal sprays to help moisten and clean your sinuses.  Take medicines only as directed by your health care provider.    If you were prescribed either an antibiotic or antifungal medicine, finish it all even if you start to feel better. SEEK IMMEDIATE MEDICAL CARE IF:  You have increasing pain or severe headaches.  You have nausea, vomiting, or drowsiness.  You have swelling around your face.  You have vision problems.  You have a stiff  neck.  You have difficulty breathing. MAKE SURE YOU:   Understand these instructions.  Will watch your condition.  Will get help right away if you are not doing well or get worse. Document Released: 08/08/2005 Document Revised: 12/23/2013 Document Reviewed: 08/23/2011 [facility] Patient Information 2015 [place] [employer] [place]. This information is not intended to replace advice given to you by your health care provider. Make sure you discuss any questions you have with your health care provider.   I have called in Augmentin for you. Please take this as directed 2 times a day for 10 days. Follow Up with your primary care if no improvement in 2 weeks. Continue all of your allergy medications as prescribed.  Attempt to get some rest, stay well hydrated and use your pain meds for your headache.

## 2014-11-12 NOTE — Progress Notes (Signed)
   Subjective:    Patient ID: [name], female    DOB: 07-30-1976, 39 y.o.   MRN: [id number]  HPI  Facial pressure: Patient presents to family medicine clinic, for same-day appointment, with "heaviness " in her forehead, face and shoulders. She states this is not a headache like her migraines, and is really in no pain, more of a pressure feeling. She endorses congestion, facial pressure over forehead and cheeks, teeth pain, dizziness, occasional chills, nausea and ear discomfort. She is eating and drinking well. She denies vomit or diarrhea. She reports her son has been sick last week, and she has a chronic history of allergies and allergic rhinitis in which she takes Claritin, Nasacort, Singulair and nasal flushes. She has increased congestion and facial pressure for 3 days now. He has attempted to take her migraine medicine, sumatriptan, and has not been helpful. He also has a history of vertigo in which she tried to take her meclizine for this and it was not helpful.  Current every day smoker Past Medical History  Diagnosis Date  . Hypertension   . Migraine   . Vertigo    Allergies  Allergen Reactions  . Aripiprazole     REACTION: Disoriented.  . Escitalopram Oxalate     REACTION: disoriented.  . Latex Itching   Review of Systems Per history of present illness    Objective:   Physical Exam BP 135/92 mmHg  Pulse 95  Temp(Src) 98.5 F (36.9 C) (Oral)  Ht 5\' 9"  (1.753 m)  Wt 233 lb 9.6 oz (105.96 kg)  BMI 34.48 kg/m2  LMP 10/25/2014 Gen: NAD. Nontoxic in appearance, appears fatigued. Pleasant African-American female, cooperative with exam, well-developed, well-nourished, obese HEENT: AT. [place]. Bilateral TM visualized and normal in appearance, irritated canal bilaterally, dry flaky. Bilateral eyes without injections or icterus. MMM. Bilateral nares with severe erythema and swelling. Throat without erythema or exudates. Cobblestoning present. Tender to palpation over frontal  and maxillary sinuses bilaterally. Congestion present Neck: Supple, anterior lymphadenopathy present CV: RRR  Chest: CTAB, no wheeze or crackles     Assessment & Plan:

## 2014-11-12 NOTE — Assessment & Plan Note (Signed)
Patient with signs and symptoms of acute sinusitis today. She had recent exposure to her son which was sick as well, and has ongoing allergies with allergic rhinitis in which she is medicated for why her PCP and allergist. I'm doubtful this is migraine or tension headache in origin, although she does have a history of migraines. I have prescribed Augmentin by mouth twice a day for 10 days. Encouraged her to rest, drink plenty of fluids and use over-the-counter medications for her headache. Follow up with her PCP in 2 weeks

## 2014-11-13 NOTE — Progress Notes (Signed)
I was the preceptor for this encounter. Tyla Burgner, M.D. 

## 2014-11-17 ENCOUNTER — Other Ambulatory Visit: Payer: Self-pay | Admitting: Family Medicine

## 2014-11-17 DIAGNOSIS — I10 Essential (primary) hypertension: Secondary | ICD-10-CM

## 2014-11-18 ENCOUNTER — Ambulatory Visit: Payer: Medicaid Other | Admitting: Family Medicine

## 2014-12-08 ENCOUNTER — Encounter: Payer: Self-pay | Admitting: Family Medicine

## 2014-12-08 ENCOUNTER — Ambulatory Visit (INDEPENDENT_AMBULATORY_CARE_PROVIDER_SITE_OTHER): Payer: Medicaid Other | Admitting: Family Medicine

## 2014-12-08 VITALS — BP 128/82 | HR 87 | Temp 98.4°F | Ht 69.0 in | Wt 237.4 lb

## 2014-12-08 DIAGNOSIS — K5900 Constipation, unspecified: Secondary | ICD-10-CM | POA: Diagnosis not present

## 2014-12-08 DIAGNOSIS — R42 Dizziness and giddiness: Secondary | ICD-10-CM | POA: Diagnosis present

## 2014-12-08 DIAGNOSIS — F411 Generalized anxiety disorder: Secondary | ICD-10-CM

## 2014-12-08 MED ORDER — FLUTICASONE PROPIONATE 50 MCG/ACT NA SUSP: 2.0000 | Freq: Every day | NASAL | Status: DC

## 2014-12-08 NOTE — Progress Notes (Signed)
Patient ID: Ann Headlandameka C Thetford, female   DOB: January 27, 1976, 39 y.o.   MRN: [id number] Subjective:   CC: Shoulder heaviness, vertigo  HPI:   Shoulder heaviness Patient describes 2 months of a heavy feeling in both shoulders. She denies any pain or weakness in the shoulders but reports neck pain. She feels like she is always stressed. She has been diagnosed with depression, anxiety, bipolar disorder, and [facility]. She is taking JordanLatuda and feels that although this is helping, it is causing undesirable weight gain and eating behaviors. She sees a therapist regularly. She denies any chest pain, trouble breathing, or weakness. She does minimal exercise except when she has to walk somewhere.   "Vertigo" Patient was diagnosed with a sinus infection about 2 weeks ago and since that time has continued having facial pain and vertiginous symptoms. Symptoms are worse with any head movement. She denies any lightheadedness or syncope. She has been taking Nasacort which helped, but she ran out and now Medicaid is not covering this. She reports chronic headaches and chronic vision worsening. She has not had her vision checked in quite some time. She denies fevers or chills but has continued sneezing and clear nasal drainage.  Review of Systems - Per HPI.   PMH - obesity, allergic rhinitis, anxiety, depression, tobacco abuse, migraine without aura, chronic lumbar pain, GERD, hypertension, vulvar dermatitis, dizziness, constipation    Objective:  Physical Exam BP 128/82 mmHg  Pulse 87  Temp(Src) 98.4 F (36.9 C) (Oral)  Ht 5\' 9"  (1.753 m)  Wt 237 lb 7 oz (107.701 kg)  BMI 35.05 kg/m2  LMP 11/25/2014 GEN: NAD Cardiovascular: Regular rate and rhythm, no murmurs rubs or gallops, 2+ bilateral radial pulses Pulmonary: Clear to auscultation bilaterally, normal effort Extremities: No lower extremity edema or calf tenderness, Shoulders: Normal range of motion and strength on exam, no deformity, no  erythema, no swelling, nontender Neuro: Awake, alert, no focal deficits, negative dix-hallpike on exam Psych: Mood and affect euthymic, reports increased stress but normal rate and volume of speech    Assessment:     [name] is a 39 y.o. female here for shoulder 'heaviness' and vertigo.    Plan:     # See problem list and after visit summary for problem-specific plans.   # Health Maintenance: Not discussed  Follow-up: Follow up in 1-2 months for f/u of vertigo and shoulder symptoms.   Leona SingletonMaria T Kirsi Hugh, MD [facility]

## 2014-12-08 NOTE — Patient Instructions (Signed)
It was good to see you today.  Be sure to discuss your concerns about weight gain and bulimia with your psychiatrist. There are other options that I am sure you have discussed for bipolar that would be good to bring up again. Please have your psychiatrist send me notes from this visit if possible. Increase physical activity. This will help with the stress that is likely building up in your shoulders. We discussed walking 20 minutes daily even if you have to go at a slow pace. Slowly increasing exercise can be a good way to build up endurance.  For your vertigo, work on the exercises that we went over. These are called Dix-Hallpike exercises. Please get your eyes checked like your mom suggested. I will send in Flonase which is a nasal steroid. If Medicaid does not cover this, ask the pharmacy to send me options that they do cover.  Follow-up at our clinic after you see your psychiatrist.  Best,  Leona SingletonMaria T Caspar Favila, MD  Benign Positional Vertigo Vertigo means you feel like you or your surroundings are moving when they are not. Benign positional vertigo is the most common form of vertigo. Benign means that the cause of your condition is not serious. Benign positional vertigo is more common in older adults. CAUSES  Benign positional vertigo is the result of an upset in the labyrinth system. This is an area in the middle ear that helps control your balance. This may be caused by a viral infection, head injury, or repetitive motion. However, often no specific cause is found. SYMPTOMS  Symptoms of benign positional vertigo occur when you move your head or eyes in different directions. Some of the symptoms may include:  Loss of balance and falls.  Vomiting.  Blurred vision.  Dizziness.  Nausea.  Involuntary eye movements (nystagmus). DIAGNOSIS  Benign positional vertigo is usually diagnosed by physical exam. If the specific cause of your benign positional vertigo is unknown, your  caregiver may perform imaging tests, such as magnetic resonance imaging (MRI) or computed tomography (CT). TREATMENT  Your caregiver may recommend movements or procedures to correct the benign positional vertigo. Medicines such as meclizine, benzodiazepines, and medicines for nausea may be used to treat your symptoms. In rare cases, if your symptoms are caused by certain conditions that affect the inner ear, you may need surgery. HOME CARE INSTRUCTIONS   Follow your caregiver's instructions.  Move slowly. Do not make sudden body or head movements.  Avoid driving.  Avoid operating heavy machinery.  Avoid performing any tasks that would be dangerous to you or others during a vertigo episode.  Drink enough fluids to keep your urine clear or pale yellow. SEEK IMMEDIATE MEDICAL CARE IF:   You develop problems with walking, weakness, numbness, or using your arms, hands, or legs.  You have difficulty speaking.  You develop severe headaches.  Your nausea or vomiting continues or gets worse.  You develop visual changes.  Your family or friends notice any behavioral changes.  Your condition gets worse.  You have a fever.  You develop a stiff neck or sensitivity to light. MAKE SURE YOU:   Understand these instructions.  Will watch your condition.  Will get help right away if you are not doing well or get worse. Document Released: 05/16/2006 Document Revised: 10/31/2011 Document Reviewed: 04/28/2011 [facility] Patient Information 2015 [employer] [place]. This information is not intended to replace advice given to you by your health care provider. Make sure you discuss any questions you have with  your health care provider.

## 2014-12-08 NOTE — Assessment & Plan Note (Signed)
Symptoms consistent with BPPV with worsened vertigo symptoms when changing positions. Also some possible relationship with recent sinusitis. Dix-hallpike negative in clinic. -Reviewed Dix-Hallpike exercises with patient and urged her to do these regularly. -Also urged patient to visit optometrist for eye exam and consideration of glasses. -We'll d/c nasocort and send in Flonase in hopes that this is covered by her insurance. Asked patient to notify me if not. -F/u 1-2 mo, refer to ENT if no improvement.

## 2014-12-08 NOTE — Assessment & Plan Note (Signed)
Shoulder heaviness is likely manifestation of anxiety with MSK tightness and reported worsening stress. Strength and range of motion appear intact on exam. No swelling or deformities. No pain. - Discussed continuing Latuda but talking with psychiatrist about other options for her due to concern re: weight gain / "bulemic" tendencies on this medication. Patient ask psychiatrist to send records of this visit to our office. - Continue seeing therapist. - Discussed physical activity benefits on mood and musculoskeletal symptoms. Patient is amenable to starting a walking regimen with 15-20 minutes 3-5 days weekly. - Follow-up here in 1-2 months, after seeing psychiatrist.

## 2014-12-09 NOTE — Progress Notes (Signed)
I was preceptor the day of this visit.   

## 2015-01-31 ENCOUNTER — Telehealth: Payer: Self-pay | Admitting: Family Medicine

## 2015-01-31 NOTE — Telephone Encounter (Signed)
[facility] Emergency Line  Patient called c/o continued congestion. Taking medications as prescribed. Nasal congestion is still present, seems to have gotten worse over the past 48hrs. Asking if there is anything else she can try. Denies fever, chills, cough, SOB, epistaxis, sinus pain, HA, ST, dysphagia. Endorsed some clear rhinorrhea. Recommended continuation of currently prescribed medications and steamy showers. OTC pseudoephedrine for any rhinorrhea used short-term. If symptoms continue to be an issue by Monday patient to call for SDA first thing Monday AM. If SOB develops, see UC or ED.  Kathee Delton, MD,MS,  PGY1 01/31/2015 6:25 PM

## 2015-02-03 ENCOUNTER — Other Ambulatory Visit: Payer: Self-pay | Admitting: Family Medicine

## 2015-02-05 NOTE — Telephone Encounter (Signed)
2nd request.  Demiah Gullickson L, RN  

## 2015-02-06 NOTE — Telephone Encounter (Signed)
Did not see a prior request. Filled.  Leona Singleton, MD

## 2015-02-09 ENCOUNTER — Telehealth: Payer: Self-pay | Admitting: Family Medicine

## 2015-02-09 NOTE — Telephone Encounter (Signed)
After hours line  Patient calling in describing 1 days duration of lower abd/pelvic pain with urination. Also with associated low back pain with urination. She denies dysuria, fever, loss of appetite, nausea, or malaise. I recommended urgent care vs seeking care in the morning in SDA and she would like to go to the clinic tomorrow.  We reviewed the red flags for emergency care to seek tonight and she understands these. She will call in first thing in the am to make a SDA.   Murtis Sink, MD [facility] Resident, PGY-3 02/09/2015, 5:44 PM

## 2015-02-15 ENCOUNTER — Telehealth: Payer: Self-pay | Admitting: Family Medicine

## 2015-02-15 NOTE — Telephone Encounter (Signed)
Paged to [facility] Emergency line approx 1945 by patient [name]. She reports that she woke up today feeling fine and has been healthy recently. Stated she spent the afternoon outside 11am to 3pm with her family in the yard, did not do any strenuous activity. Started to feel some cold chills and generalized weakness, went inside and slept for about 3 hours, checked temperature at 100.63F, then down to 74F, after taking Aleve 220mg  x 1. After waking up she currently still feels generalized weakness and myalgias with sharp aching pains in arms, legs, abdomen, and back without focus. Denies any HA, CP, SOB, nausea, vomiting, diarrhea, dysuria, hematuria. Tolerating PO well. Received her flu shot 07/2014. Sick contact with her young nephew with viral URI 1 week ago since resolved. Denies any known tick or mosquito exposure, no specific known bug bites. Additionally, she called the [facility] Emergency line on 02/09/15, reported some abdominal / pelvic pain worse with urination, however this has since resolved, she thought it was gas, and currently no urinary symptoms, did not seek treatment.  I advised her that she can take 2nd dose Aleve for total 440mg  q 12 hr. Recommended to get Tylenol extra str take 1000mg  q 6 hr PRN fever, myalgias. Improve hydration, may try gatorade in addition to water. Rest tonight, can monitor fever if feels worse, expect may go up to 101 to 1063F. Suspected viral syndrome, unclear etiology, may develop n/v/d if viral gastro soon vs possible flu-like symptoms with URI. Currently otherwise well, and reassurance likely can wait until morning and call [facility] to schedule SDA or follow-up if persistent symptoms. Otherwise, call back with questions tonight.  Saralyn Pilar, DO [facility], PGY-2

## 2015-02-21 ENCOUNTER — Telehealth: Payer: Self-pay | Admitting: Internal Medicine

## 2015-02-21 NOTE — Telephone Encounter (Signed)
   Reason for call:   I received a call from [name] at 7pm ( MISTAKENLY, NOT REALIZING SHE IS A PATIENT OF THE FAMILY MEDICINE SERVICE) indicating that she had been has been having migraines all day, with pain behind her eyes. It is her typical migraine but she says she has taking two tablets of her imitrex without relief, usually this helps.   She denies fever, visual changes. She denies been under a lot a lot of stress lately. Has not had a bowel movement today.    Pertinent Data:   Patient has a hx of migraines, no Kidney disease or hx of PUD or Gastritis.   Assessment / Plan / Recommendations:   Pt advices she can take two tablets of OTC 220mg  tablet alieve. She is also encouraged to eat regular diets, take her stool softner today, and try to sleep well, avoid triggers, she is also encouraged to take her PPI- Omeprazole ( She says she no longer takes protonix) And if she is still having Migraine headaches, but without fever or vision changes or extremity weakness she can come to the Clinic, otherwise if she has any of this alarming features she is to go to the ED.  Will route message to PCP- Family med service.   Ann BoerEjiroghene E Elida Harbin, MD IMTS, PGY-2   02/21/2015, 7:48 PM

## 2015-02-24 ENCOUNTER — Ambulatory Visit: Payer: Medicaid Other | Admitting: Internal Medicine

## 2015-03-09 ENCOUNTER — Telehealth: Payer: Self-pay | Admitting: *Deleted

## 2015-03-09 NOTE — Telephone Encounter (Signed)
Pt called stating the medication given to help with bowel movement is not helping.  She has not had a bowel movement in the past few days.  She is has tried OTC suppositories, increased fluid intake, drinking prune juice and nothing is helping.  She is starting to feel nauseas.  Advised patient to go to ED if she is having severe abdominal pain, n/v, fever.  She is requesting a different medication be sent in.  Please advise.  Clovis PuMartin, Ulis Kaps L, RN

## 2015-03-11 ENCOUNTER — Telehealth: Payer: Self-pay | Admitting: Family Medicine

## 2015-03-11 NOTE — Telephone Encounter (Signed)
Late phone note. I called the patient on Monday 03/09/15 to discuss recent triage note. She had been having difficulty with constipation despite taking miralax 2 times a day. She did not have any lower abdominal pain, but did say she was regurgitating some food. I told her she could take increased amount of miralax up to 6-8 caps in a day to try and get her bowels to move. If she still did not have a BM, developed pain or other concerning symptoms like blood per rectum, persistent vomiting, she would need to be evaluated sooner. Pt stated she would try increased miralax and if no relief would call the clinic for an appt. -Dr. Waynetta SandyWight

## 2015-03-14 ENCOUNTER — Other Ambulatory Visit: Payer: Self-pay | Admitting: Family Medicine

## 2015-03-16 ENCOUNTER — Telehealth: Payer: Self-pay | Admitting: Family Medicine

## 2015-03-16 MED ORDER — MECLIZINE HCL 12.5 MG PO TABS: 12.5000 mg | ORAL_TABLET | Freq: Three times a day (TID) | ORAL | Status: DC | PRN

## 2015-03-16 NOTE — Telephone Encounter (Signed)
Patient stated her vertigo is really bad due to this hot weather.  Patient advised to keep very hydrated.  Clovis Pu, RN

## 2015-03-16 NOTE — Telephone Encounter (Signed)
Patient needs a refill on meclizine (ANTIVERT) 12.5 MG tablet for her vertigo. She is currently out, and would like to speak to a nurse to receive advice on what she can do for this issue until she receives her medication. Thank you, [name], ASA

## 2015-03-16 NOTE — Telephone Encounter (Signed)
Sent in rx and called patient, left VM stating prescription available for pickup at pharmacy.

## 2015-03-27 ENCOUNTER — Telehealth: Payer: Self-pay | Admitting: Family Medicine

## 2015-03-28 NOTE — Telephone Encounter (Signed)
Family Medicine After hours phone call  Patient called to inquire about a HA (some history difficult to understand 2/2 phone connection). Has had a HA all day; described as throbbing. Has taken tylenol and ibuprofen w/o improvement. No LOC, no photosensitivity, no tearing, no fever.  I informed patient it is difficult to fully assess her over the phone. I instructed her to continue taking OTC tylenol/ibuprofen as needed and as instructed on the bottle. Patient may feel better w/ some rest in the AM.   Patient instructed to see nearby urgent care/ED if symptoms persist/worsen.  Kathee Delton, MD,MS,  PGY2 03/28/2015 12:43 AM

## 2015-04-28 ENCOUNTER — Ambulatory Visit: Payer: Medicaid Other | Admitting: Family Medicine

## 2015-04-29 ENCOUNTER — Encounter: Payer: Self-pay | Admitting: Family Medicine

## 2015-04-29 ENCOUNTER — Ambulatory Visit (INDEPENDENT_AMBULATORY_CARE_PROVIDER_SITE_OTHER): Payer: Medicaid Other | Admitting: Family Medicine

## 2015-04-29 VITALS — BP 127/80 | HR 86 | Temp 98.3°F | Ht 69.0 in | Wt 226.8 lb

## 2015-04-29 DIAGNOSIS — J329 Chronic sinusitis, unspecified: Secondary | ICD-10-CM

## 2015-04-29 DIAGNOSIS — K5900 Constipation, unspecified: Secondary | ICD-10-CM | POA: Diagnosis not present

## 2015-04-29 DIAGNOSIS — R42 Dizziness and giddiness: Secondary | ICD-10-CM | POA: Diagnosis present

## 2015-04-29 MED ORDER — PREDNISONE 20 MG PO TABS: 40.0000 mg | ORAL_TABLET | Freq: Every day | ORAL | Status: DC

## 2015-04-29 NOTE — Progress Notes (Signed)
   Subjective:    Patient ID: [name], female    DOB: [birth year], 39 y.o.   MRN: [id number]  HPI  CC: sinusitis  # Chronic rhinosinusitis:  See allergist. Taking: Flonase, singulair, claritin, xyzal, nasal saline rinse. Used to be on allergy shots.  Congestion, postnasal drip, runny nose  No ear pain or hearing changes  No fevers/chills, no nausea/vomiting  No sinus pressure, no toothache  Actually felt worse 3 days ago, still not resolved/improved  Review of Systems   See HPI for ROS.   Past medical history, surgical, family, and social history reviewed and updated in the EMR as appropriate. Objective:  BP 127/80 mmHg  Pulse 86  Temp(Src) 98.3 F (36.8 C) (Oral)  Ht  (1.753 m)  Wt 226 lb 12.8 oz (102.876 kg)  BMI 33.48 kg/m2  LMP 04/23/2015 Vitals and nursing note reviewed  General: NAD Ears: TM pearly gray bilaterally without effusion or erythema Nose: enlarged/inflamed turbinates bilaterally, rhinorrhea present. No tenderness to palpation of frontal or maxillary sinuses Mouth: mild posterior erythema, no exudate CV: RRR, normal heart sounds, no murmurs Resp: CTAB, normal effort  Assessment & Plan:  Chronic rhinosinusitis Acute worsening. Low suspicion for bacterial at this time. Somewhat improved since 3 days ago. On what appears to be maximal allergy therapy including 2 antihistamines (claritin + xyzal) and follows with allergist. Symptomatic treatment with prednisone  x 6 days. Pt to call if worsens and will call in antibiotic at that time. F/u as needed.

## 2015-04-29 NOTE — Assessment & Plan Note (Signed)
Acute worsening. Low suspicion for bacterial at this time. Somewhat improved since 3 days ago. On what appears to be maximal allergy therapy including 2 antihistamines (claritin + xyzal) and follows with allergist. Symptomatic treatment with prednisone  x 6 days. Pt to call if worsens and will call in antibiotic at that time. F/u as needed.

## 2015-05-22 ENCOUNTER — Telehealth: Payer: Self-pay | Admitting: Family Medicine

## 2015-05-22 NOTE — Telephone Encounter (Signed)
Family Medicine After hours phone call  The patient is calling the after hours phone line due to a severe migraine. She states that she has not had a migraine to this caliber in the past. She endorses photosensitivity and phonosensitivity. She also is complaining of nausea and anorexia. Patient states that upon symptom onset she initially took 2 Aleve. When this did not provide any benefit she then took 1 Imitrex. At this time she is 20-30 minutes out from taking this medication. States that it has not had any benefit at this time but that it usually takes a little under an hour before it sets in. She denies any infusion weakness numbness blurry vision shortness of breath chest pain. She and I discussed a plan for her to get through tonight. This plan involves her finding a dark quiet and comfortable room to lay down, keeping her fluids up if vomiting ensues, if migraine persists after 2 hours she is to take a second dose of Imitrex, and if she still has no relief she states that she will at that time have a loved one drive her to a nearby ED/urgent care.  Patient had no questions or concerns after this plan was discussed.  Kathee Delton, MD,MS,  PGY2 05/22/2015 9:29 PM

## 2015-06-03 ENCOUNTER — Ambulatory Visit (INDEPENDENT_AMBULATORY_CARE_PROVIDER_SITE_OTHER): Payer: Medicaid Other | Admitting: Family Medicine

## 2015-06-03 ENCOUNTER — Encounter: Payer: Self-pay | Admitting: Family Medicine

## 2015-06-03 VITALS — BP 134/82 | HR 88 | Temp 98.5°F | Wt 229.0 lb

## 2015-06-03 DIAGNOSIS — J329 Chronic sinusitis, unspecified: Secondary | ICD-10-CM

## 2015-06-03 DIAGNOSIS — K5900 Constipation, unspecified: Secondary | ICD-10-CM | POA: Diagnosis not present

## 2015-06-03 DIAGNOSIS — S46912A Strain of unspecified muscle, fascia and tendon at shoulder and upper arm level, left arm, initial encounter: Secondary | ICD-10-CM

## 2015-06-03 DIAGNOSIS — R42 Dizziness and giddiness: Secondary | ICD-10-CM | POA: Diagnosis present

## 2015-06-03 MED ORDER — CYCLOBENZAPRINE HCL 10 MG PO TABS: 10.0000 mg | ORAL_TABLET | Freq: Every evening | ORAL | Status: DC | PRN

## 2015-06-03 MED ORDER — TRAMADOL HCL 50 MG PO TABS: 50.0000 mg | ORAL_TABLET | Freq: Every day | ORAL | Status: DC | PRN

## 2015-06-03 NOTE — Patient Instructions (Addendum)
Please call Dr Ellyn HackGore's office for an appointment.  A new referral has been placed for you.  Continue current therapies.  If you develop fevers, purulence from sinuses please seek immediate medical attention.  Do not drive with medications for shoulder pain.  Muscle Strain A muscle strain is an injury that occurs when a muscle is stretched beyond its normal length. Usually a small number of muscle fibers are torn when this happens. Muscle strain is rated in degrees. First-degree strains have the least amount of muscle fiber tearing and pain. Second-degree and third-degree strains have increasingly more tearing and pain.  Usually, recovery from muscle strain takes 1-2 weeks. Complete healing takes 5-6 weeks.  CAUSES  Muscle strain happens when a sudden, violent force placed on a muscle stretches it too far. This may occur with lifting, sports, or a fall.  RISK FACTORS Muscle strain is especially common in athletes.  SIGNS AND SYMPTOMS At the site of the muscle strain, there may be:  Pain.  Bruising.  Swelling.  Difficulty using the muscle due to pain or lack of normal function. DIAGNOSIS  Your health care provider will perform a physical exam and ask about your medical history. TREATMENT  Often, the best treatment for a muscle strain is resting, icing, and applying cold compresses to the injured area.  HOME CARE INSTRUCTIONS   Use the PRICE method of treatment to promote muscle healing during the first 2-3 days after your injury. The PRICE method involves:  Protecting the muscle from being injured again.  Restricting your activity and resting the injured body part.  Icing your injury. To do this, put ice in a plastic bag. Place a towel between your skin and the bag. Then, apply the ice and leave it on from 15-20 minutes each hour. After the third day, switch to moist heat packs.  Apply compression to the injured area with a splint or elastic bandage. Be careful not to wrap it too  tightly. This may interfere with blood circulation or increase swelling.  Elevate the injured body part above the level of your heart as often as you can.  Only take over-the-counter or prescription medicines for pain, discomfort, or fever as directed by your health care provider.  Warming up prior to exercise helps to prevent future muscle strains. SEEK MEDICAL CARE IF:   You have increasing pain or swelling in the injured area.  You have numbness, tingling, or a significant loss of strength in the injured area. MAKE SURE YOU:   Understand these instructions.  Will watch your condition.  Will get help right away if you are not doing well or get worse.   This information is not intended to replace advice given to you by your health care provider. Make sure you discuss any questions you have with your health care provider.   Document Released: 08/08/2005 Document Revised: 05/29/2013 Document Reviewed: 03/07/2013 [employer] Interactive Patient Education [employer]

## 2015-06-03 NOTE — Progress Notes (Signed)
    Subjective: CC: sinus infection HPI: Patient is a 39 y.o. female presenting to clinic today for same day appointment. Concerns today include:  1. Sinus infection Patient reports that they were seen in clinic recently for similar symptoms.  She was instructed to come back for evaluation if symptoms worsened or failed to improve.  She reports that symptoms have been going on for 2 weeks.  She has taken pseudofed with a little improvement on the right side of her sinuses.  She reports stuffy nose, full/ painful ears, headache, sore throat.  Taking OTC sinus meds, Claritin, Xyzal, Netty pot, humidification.  She notes some relief but no resolution.  Last visit with allergist was "a while ago".  Patient denies fevers, chills, sick contacts.  Mild non productive cough.  She endorses post nasal drip.  2. Shoulder pain Patient reports that she has had L shoulder/neck pain and soreness.  Denies injury, repetitive movement.  Denies numbness or tingling.  Denies weakness.  Patient has been using muscle relaxer flexeril and Tramadol, which helps.  She is taking medication PRN.  Social History Reviewed: active smoker, smoke 1.2 ppd newports. FamHx and MedHx updated.  Please see EMR. Health Maintenance: Flu shot due  ROS: All other systems reviewed and are negative.  Objective: Office vital signs reviewed. BP 134/82 mmHg  Pulse 88  Temp(Src) 98.5 F (36.9 C) (Oral)  Wt 229 lb (103.874 kg)  LMP 05/21/2015  Physical Examination:  General: Awake, alert, well nourished, NAD HEENT: Normal, no TTP to sinuses.    Neck: No masses palpated. No LAD    Ears: TMs intact, normal light reflex, no erythema, no bulging    Eyes: PERRLA, EOMI    Nose: nasal turbinates moist, boggy    Throat: MMM, mild erythema Cardio: RRR, S1S2 heard, no murmurs appreciated Pulm: CTAB, no wheezes, rhonchi or rales, normal WOB GI: soft, NT/ND,+BS x4, no hepatomegaly, no splenomegaly Extremities: WWP, No edema, cyanosis or  clubbing; +2 pulses bilaterally MSK: Normal gait and station, FROM, no erythema or swelling of L shoulder.  Spurling's negative, rotator cuff testing negative. Skin: dry, intact, no rashes or lesions Neuro: Strength and sensation grossly intact  Assessment/ Plan: 39 y.o. female with  1. Chronic sinusitis, unspecified location.  No evidence of bacterial infection.  Patient is afebrile and seems to be experiencing sinus congestion.   - Continue current antihistamine therapies - Smoking cessation STRONGLY recommended - Patient referred back to Dr Emeline DarlingGore for further evaluation, possible consideration of sinus surgery if appropriate. - Ambulatory referral to ENT - Return precautions reviewed. - Follow up with PCP as scheduled for routine care.  2. Muscle strain, shoulder region, left, initial encounter.  No evidence of rotator cuff pathology.  No bony abnormalities.  No neuro or strength deficits. - RICE - cyclobenzaprine (FLEXERIL) 10 MG tablet; Take 1 tablet (10 mg total) by mouth at bedtime as needed for muscle spasms.  Dispense: 30 tablet; Refill: 0 - traMADol (ULTRAM) 50 MG tablet; Take 1 tablet (50 mg total) by mouth daily as needed.  Dispense: 30 tablet; Refill: 0 - Return precautions reviewed  Raliegh IpAshly M Anmol Fleck, DO PGY-2, [facility]

## 2015-07-28 ENCOUNTER — Encounter: Payer: Self-pay | Admitting: Family Medicine

## 2015-07-28 ENCOUNTER — Other Ambulatory Visit: Payer: Self-pay | Admitting: Family Medicine

## 2015-07-28 MED ORDER — TIZANIDINE HCL 4 MG PO CAPS: 4.0000 mg | ORAL_CAPSULE | Freq: Three times a day (TID) | ORAL | Status: DC

## 2015-08-30 ENCOUNTER — Other Ambulatory Visit: Payer: Self-pay | Admitting: Family Medicine

## 2015-09-01 MED ORDER — HYDROCHLOROTHIAZIDE 12.5 MG PO CAPS: 12.5000 mg | ORAL_CAPSULE | Freq: Every day | ORAL | Status: DC

## 2015-09-01 NOTE — Addendum Note (Signed)
Addended by: Nani RavensWIGHT, Branden Vine M on: 09/01/2015 10:06 AM   Modules accepted: Orders

## 2015-09-12 ENCOUNTER — Telehealth: Payer: Self-pay | Admitting: Family Medicine

## 2015-09-12 NOTE — Telephone Encounter (Signed)
**  Emergency/Outside line phone call**  Patient reports that she is having a migraine headache that is causing blurry vision.  She notes that she took a Tramadol.  She reports she has Imitrex but did not use it because she was unsure if she could take it with the Tramadol.  Denies weakness, numbness or tingling, slurred speech or confusion.  She reports compliance with BP medication.  Recommended that she take her Imitrex.  If no improvement, she is to come to ED for evaluation.  Low suspicion for CVA.  Jaquail Mclees M. Nadine Counts, DO PGY-2, [facility]

## 2015-09-22 ENCOUNTER — Ambulatory Visit (INDEPENDENT_AMBULATORY_CARE_PROVIDER_SITE_OTHER): Payer: Medicaid Other | Admitting: Family Medicine

## 2015-09-22 ENCOUNTER — Encounter: Payer: Self-pay | Admitting: Family Medicine

## 2015-09-22 VITALS — BP 131/86 | HR 92 | Temp 97.8°F | Ht 69.0 in | Wt 231.0 lb

## 2015-09-22 DIAGNOSIS — M542 Cervicalgia: Secondary | ICD-10-CM | POA: Diagnosis not present

## 2015-09-22 DIAGNOSIS — Z23 Encounter for immunization: Secondary | ICD-10-CM

## 2015-09-22 DIAGNOSIS — R5383 Other fatigue: Secondary | ICD-10-CM

## 2015-09-22 DIAGNOSIS — R5382 Chronic fatigue, unspecified: Secondary | ICD-10-CM | POA: Diagnosis not present

## 2015-09-22 DIAGNOSIS — K5900 Constipation, unspecified: Secondary | ICD-10-CM | POA: Diagnosis not present

## 2015-09-22 DIAGNOSIS — R42 Dizziness and giddiness: Secondary | ICD-10-CM | POA: Diagnosis present

## 2015-09-22 LAB — CBC
HCT: 39.1 % (ref 36.0–46.0)
Hemoglobin: 12.4 g/dL (ref 12.0–15.0)
MCH: 22.6 pg — ABNORMAL LOW (ref 26.0–34.0)
MCHC: 31.7 g/dL (ref 30.0–36.0)
MCV: 71.2 fL — AB (ref 78.0–100.0)
MPV: 9.8 fL (ref 8.6–12.4)
Platelets: 289 10*3/uL (ref 150–400)
RBC: 5.49 MIL/uL — ABNORMAL HIGH (ref 3.87–5.11)
RDW: 15 % (ref 11.5–15.5)
WBC: 8.8 10*3/uL (ref 4.0–10.5)

## 2015-09-22 LAB — TSH: TSH: 0.829 u[IU]/mL (ref 0.350–4.500)

## 2015-09-22 NOTE — Progress Notes (Signed)
   Subjective:    Patient ID: [name], female    DOB: 06-24-1976, 40 y.o.   MRN: [id number]  HPI  CC: Neck and shoulder pain  # Neck/shoulder pain:  Right handed  Neck, shoulders, left arm. Has had this for about 1-2 years. No trauma or inciting event she can remember  Pain is there pretty much every day  Not currently working, does a lot of cleaning, goes to morning GED classes  On the computer a lot (desktop)  Does notice with her migraine she gets pain  Laying down makes the pain come on more/worse; improves with getting up and stretching  Intermittent numbness/tingling in hands and feet, usually lasts about a minute or so and she is able to massage to "work it out". Notices numbness especially in the left arm/hand. Numbness happens randomly  OTC takes advil, sinus medicines. Takes a multivitamin, biotin, melatonin  Sleeping on an air mattress ROS: +blurry vision, no extremity weakness  # Fatigue  Feels tired "all the time" -- told by her therapist to possibly get a sleep study done  Not sure if she snores at all  Has trouble staying awake during the day ROS: no weight changes, no hair loss  Social Hx: current smoker  Review of Systems   See HPI for ROS.   Past medical history, surgical, family, and social history reviewed and updated in the EMR as appropriate. Objective:  BP 131/86 mmHg  Pulse 92  Temp(Src) 97.8 F (36.6 C) (Oral)  Ht  (1.753 m)  Wt 231 lb (104.781 kg)  BMI 34.10 kg/m2  LMP 09/11/2015 Vitals and nursing note reviewed  General: no apparent distress MSK:  Neck: ROM full/intact, pain only produced with extension. There is no deformity or swelling noted but there is tenderness near base of skull Left shoulder: ROM intact. No obvious deformity or swelling. Area of tenderness on top of shoulder medial to [facility] joint. Negative empty can.  Neuro: strength testing 5/5 biceps, triceps, wrist extension/flexion, finger  abduction/adduction  Assessment & Plan:  Neck pain Reported as occuring for past year but on chart review looks like it has been 4+ years. I suspect most of the pain is starting in her neck and then traveling down to her left arm. She has an overall normal exam. No reported trauma to her neck. Recommended continuing heating pads, OTC NSAID, muscle relaxer, referral to PT for evaluation/instruction of exercises for her neck. Did also recommend repeat x-rays to get at her convenience. Follow up after PT if not improving.  Fatigue Brings this up near end of visit. On brief history there is a possibility of OSA, asked her to use a sleep tracking app/monitor for signs of snoring. Initial labs including CBC and TSH were within normal limits. Asked her to follow up if this persists within 1-2 months.

## 2015-09-22 NOTE — Patient Instructions (Addendum)
Sleep as Android -- use to record snoring. If we are still having a lot of fatigue and you find out you are snoring we may want to get a sleep study done.  Referral to physical therapy for your neck and shoulder pain. Learn the exercises they would recommend to you and focus on staying consistent with doing them at home.  Continue over the counter pain medicines, icing or heating pads.  Get the x-rays done of your neck at your earliest convenience.

## 2015-09-23 DIAGNOSIS — M542 Cervicalgia: Secondary | ICD-10-CM | POA: Insufficient documentation

## 2015-09-23 NOTE — Assessment & Plan Note (Signed)
Reported as occuring for past year but on chart review looks like it has been 4+ years. I suspect most of the pain is starting in her neck and then traveling down to her left arm. She has an overall normal exam. No reported trauma to her neck. Recommended continuing heating pads, OTC NSAID, muscle relaxer, referral to PT for evaluation/instruction of exercises for her neck. Did also recommend repeat x-rays to get at her convenience. Follow up after PT if not improving.

## 2015-09-24 DIAGNOSIS — R5383 Other fatigue: Secondary | ICD-10-CM | POA: Insufficient documentation

## 2015-09-24 NOTE — Assessment & Plan Note (Signed)
Brings this up near end of visit. On brief history there is a possibility of OSA, asked her to use a sleep tracking app/monitor for signs of snoring. Initial labs including CBC and TSH were within normal limits. Asked her to follow up if this persists within 1-2 months.

## 2015-09-25 ENCOUNTER — Telehealth: Payer: Self-pay | Admitting: Family Medicine

## 2015-09-25 NOTE — Telephone Encounter (Signed)
Called and informed of normal testing results. Did discuss MCV being a little low which may mean she might be a bit iron deficient (but normal Hgb), so encouraged eating foods high in iron and maybe taking a supplement 1-2 times a week. Also recommended makign sure she gets adequate vitamin D3 during the winter months, but told her we did not test for this with the blood work. -Dr. Waynetta Sandy

## 2015-10-20 ENCOUNTER — Telehealth: Payer: Self-pay | Admitting: Family Medicine

## 2015-10-20 NOTE — Telephone Encounter (Signed)
Did not get her xrays done for her neck.  Needs another referral to [facility]

## 2015-10-27 ENCOUNTER — Telehealth: Payer: Self-pay | Admitting: Family Medicine

## 2015-10-27 NOTE — Telephone Encounter (Signed)
Family Medicine After hours phone call  Abdominal pain x1 week. Complaining of bloating. Last BM was last week. No blood in stools. Has been taking prescribed meds but no significant relief. Afebrile. No watery stools. Still able to take in PO. Patient [place] for [place].   Difficult to assess w/o PE. Encouraged to take prescribed medications and to stay well hydrated. SDA slots are available for tomorrow and I encouraged to call first thing in AM for one of these.  Kathee DeltonIan D Marlinda Miranda, MD,MS,  PGY2 10/27/2015 6:42 PM

## 2015-11-02 ENCOUNTER — Encounter: Payer: Self-pay | Admitting: Family Medicine

## 2015-11-02 ENCOUNTER — Other Ambulatory Visit: Payer: Self-pay | Admitting: *Deleted

## 2015-11-02 DIAGNOSIS — S46912A Strain of unspecified muscle, fascia and tendon at shoulder and upper arm level, left arm, initial encounter: Secondary | ICD-10-CM

## 2015-11-02 DIAGNOSIS — R9431 Abnormal electrocardiogram [ECG] [EKG]: Secondary | ICD-10-CM | POA: Insufficient documentation

## 2015-11-02 MED ORDER — TRAMADOL HCL 50 MG PO TABS: 50.0000 mg | ORAL_TABLET | Freq: Every day | ORAL | Status: DC | PRN

## 2015-11-03 ENCOUNTER — Telehealth: Payer: Self-pay | Admitting: *Deleted

## 2015-11-03 NOTE — Telephone Encounter (Signed)
Received faxed request from [facility] asking for DEA number of authorizing provider of tramadol 50 mg # 30 with no refills, Take 1 tab PO  daily as needed written yesterday. Spoke with pharmacist, Hessie DienerAlan, at [facility] [phone] and relayed information. Fredderick SeveranceUCATTE, [employer] Mcjunkins L, RN

## 2015-11-24 ENCOUNTER — Other Ambulatory Visit: Payer: Self-pay | Admitting: Family Medicine

## 2015-11-24 ENCOUNTER — Other Ambulatory Visit: Payer: Self-pay | Admitting: Nurse Practitioner

## 2015-11-27 ENCOUNTER — Other Ambulatory Visit: Payer: Self-pay | Admitting: Nurse Practitioner

## 2015-11-27 ENCOUNTER — Other Ambulatory Visit: Payer: Self-pay | Admitting: Family Medicine

## 2015-11-30 ENCOUNTER — Other Ambulatory Visit: Payer: Self-pay | Admitting: Family Medicine

## 2015-12-01 NOTE — Telephone Encounter (Signed)
LVM for pt to call the office to inform her of the below. Sunday SpillersSharon T Bryar Rennie, CMA

## 2015-12-01 NOTE — Telephone Encounter (Signed)
This medication has never been prescribed by PCP, Dr. Waynetta SandyWight. Therefore, will not send a refill for this medication. Patient can contact her prescribing physician, wait for Dr. Waynetta SandyWight to return, or make an appointment for evaluation.

## 2015-12-03 ENCOUNTER — Telehealth: Payer: Self-pay | Admitting: *Deleted

## 2015-12-03 NOTE — Telephone Encounter (Signed)
Returned patient call c/o nausea X 4 days. Last 2 days feels like her abdomen is "throbbing and going to pop." States she forced vomiting this morning after eating piece of chicken because of this feeling Reports "slight" pain on left side of abdomen Reports last bowel movement 2 days ago after taking suppository. States hx of constipation Denies fever Has appt with PCP on Monday, 12/07/15  Patient advised not to force vomiting, drink plenty of fluids to avoid dehydration, keep appt with PCP on Monday and head to urgent care or ED if fever, vomiting, or increased pain present. Patient states understanding DUCATTE, Nickola MajorLAURENZE L, RN

## 2015-12-07 ENCOUNTER — Ambulatory Visit (INDEPENDENT_AMBULATORY_CARE_PROVIDER_SITE_OTHER): Payer: Medicaid Other | Admitting: Family Medicine

## 2015-12-07 VITALS — BP 133/83 | HR 80 | Temp 98.7°F | Wt 238.1 lb

## 2015-12-07 DIAGNOSIS — R42 Dizziness and giddiness: Secondary | ICD-10-CM | POA: Diagnosis not present

## 2015-12-07 DIAGNOSIS — K5900 Constipation, unspecified: Secondary | ICD-10-CM

## 2015-12-07 MED ORDER — OMEPRAZOLE 20 MG PO CPDR: 20.0000 mg | DELAYED_RELEASE_CAPSULE | Freq: Every day | ORAL | Status: DC

## 2015-12-07 MED ORDER — POLYETHYLENE GLYCOL 3350 17 G PO PACK: 17.0000 g | PACK | Freq: Two times a day (BID) | ORAL | Status: DC

## 2015-12-07 NOTE — Patient Instructions (Signed)
Most likely really constipated:  Miralax, mix 8 packets in 32 oz of water, drink this over 2 hours. Wait 2-3 hours and if you don't have a bowel movement repeat 8 packets in 32 oz.   Repeat this tomorrow if still not having a bowel movement. If STILL not having a bowel movement, return to be seen.   If the above cleans you out, start with taking just 1 packet of miralax, increase this or decrease by 1/2 to 1 packet to get a goal of 1 bowel movement every 1-2 days.

## 2015-12-07 NOTE — Progress Notes (Signed)
   Subjective:    Patient ID: [name], female    DOB: [birth year], 40 y.o.   MRN: [id number]  HPI  CC: not feeling well  # Possible constipation:  Thursday was having a feeling of food she ate sitting on the top of her stomach, sensation was uncomfortable and she forced herself to throw up. Vomit was food pieces/water, no blood or mucous.   Hasn't eaten much past 3 days, mostly drinking some fluids. Hasn't had any big meals. No recent vomiting.   Denies any current stomach pain.   Last BM was "last week", took suppository, maybe Wednesday. Suppository only got a small amount out.   Biggest symptom is "scared to eat"  Took miralax 1 week ago, tried taking 2 packets but didn't produce a BM. Also takes colace.   ROS: no fevers, no chills, was nauseated last week but not currently. Voiding normally. No other pain.   Social Hx: current smoker  Review of Systems   See HPI for ROS.   Past medical history, surgical, family, and social history reviewed and updated in the EMR as appropriate. Objective:  BP 133/83 mmHg  Pulse 80  Temp(Src) 98.7 F (37.1 C) (Oral)  Wt 238 lb 1.6 oz (108.001 kg)  LMP 11/09/2015 (Approximate) Vitals and nursing note reviewed  General: no apparent distress  CV: normal rate, regular rhythm, no murmurs, rubs or gallop.  Resp: clear to auscultation bilaterally, normal effort Abdomen: soft, obese, mildly distended, no TTP. There is dullness to percussion LLQ, probable stool burden palpable. Normal bowel sounds   Assessment & Plan:  1. Constipation, unspecified constipation type Suspect probable constipation. She cannot remember last "good" BM, and says on average maybe has 1 BM a week. Her son also has issues with constipation. Discussed miralax cleanout protocol (see AVS), then adding daily miralax and titrate to BM every 1-2 days. Follow up if this does not resolve.

## 2015-12-09 ENCOUNTER — Encounter: Payer: Self-pay | Admitting: Family Medicine

## 2015-12-29 ENCOUNTER — Encounter: Payer: Self-pay | Admitting: Family Medicine

## 2015-12-29 ENCOUNTER — Ambulatory Visit (INDEPENDENT_AMBULATORY_CARE_PROVIDER_SITE_OTHER): Payer: Medicaid Other | Admitting: Family Medicine

## 2015-12-29 VITALS — BP 131/84 | HR 96 | Temp 99.0°F | Wt 234.0 lb

## 2015-12-29 DIAGNOSIS — R42 Dizziness and giddiness: Secondary | ICD-10-CM | POA: Diagnosis not present

## 2015-12-29 DIAGNOSIS — R238 Other skin changes: Secondary | ICD-10-CM

## 2015-12-29 DIAGNOSIS — R21 Rash and other nonspecific skin eruption: Secondary | ICD-10-CM

## 2015-12-29 DIAGNOSIS — K5900 Constipation, unspecified: Secondary | ICD-10-CM | POA: Diagnosis not present

## 2015-12-29 MED ORDER — VALACYCLOVIR HCL 1 G PO TABS: 1000.0000 mg | ORAL_TABLET | Freq: Two times a day (BID) | ORAL | Status: AC

## 2015-12-29 NOTE — Progress Notes (Signed)
   Subjective:    Patient ID: [name], female    DOB: October 01, 1975, 40 y.o.   MRN: [id number]  HPI  Patient presents for Same Day Appointment  CC: rash  # Rash below lip:  Says first started about 2 weeks ago, was much smaller  Started as "pus" bubbles  Very itchy and has been scratching. Also has a burning sensation  Seemed to come back worse 1 week ago  Has been trying neosporin, hydrocortisone cream on it  Lots of blisters, cracking. ROS: no fevers or chills, no trouble swallowing, no mouth pain  Social Hx: current smoker  Review of Systems   See HPI for ROS.   Past medical history, surgical, family, and social history reviewed and updated in the EMR as appropriate.  Objective:  BP 131/84 mmHg  Pulse 96  Temp(Src) 99 F (37.2 C) (Oral)  Wt 234 lb (106.142 kg)  LMP 12/10/2015 (Approximate) Vitals and nursing note reviewed  General: no apparent distress  Skin: there is a patch approx 2x2cm below lower lip near midline with some scattered papules around this that almost appear to be ruptured vesicles. Skin is thickened here, with some cracking. A little tender  Assessment & Plan:  1. Vesicular rash Story sounds most consistent with cold sore, and she has worsened by scratching and breaking the skin. Collected culture today (attempted but there were no vesicles to rupture today) and will treat with valtrex 1g twice daily x 10 days. Avoid any topical products for now. Schedule follow up in 1 week to see if this is improving on valtrex.  - Herpes Simplex Virus Culture

## 2015-12-31 LAB — HERPES SIMPLEX VIRUS CULTURE: Organism ID, Bacteria: NOT DETECTED

## 2016-01-01 ENCOUNTER — Telehealth: Payer: Self-pay | Admitting: Family Medicine

## 2016-01-01 MED ORDER — MUPIROCIN 2 % EX OINT: TOPICAL_OINTMENT | CUTANEOUS | Status: DC

## 2016-01-01 NOTE — Telephone Encounter (Signed)
Called and discussed negative HSV swab from facial skin. Recommended finishing out valtrex. Patient did state it was crusting a lot more and getting more tense, which might actually be consistent with impetigo (also reported a little improvement with neosporin) -- will send in mupirocin ointment. Patient has follow up next week. Patient had no further questions.

## 2016-01-12 ENCOUNTER — Ambulatory Visit: Payer: Medicaid Other | Admitting: Family Medicine

## 2016-03-04 ENCOUNTER — Other Ambulatory Visit: Payer: Self-pay | Admitting: Allergy and Immunology

## 2016-03-16 ENCOUNTER — Ambulatory Visit (INDEPENDENT_AMBULATORY_CARE_PROVIDER_SITE_OTHER): Payer: Medicaid Other | Admitting: Student

## 2016-03-16 ENCOUNTER — Telehealth: Payer: Self-pay | Admitting: Student

## 2016-03-16 ENCOUNTER — Ambulatory Visit (HOSPITAL_COMMUNITY)
Admission: RE | Admit: 2016-03-16 | Discharge: 2016-03-16 | Disposition: A | Discharge status: Home / Self Care | Payer: Medicaid Other | Source: Ambulatory Visit | Attending: Family Medicine | Admitting: Family Medicine

## 2016-03-16 VITALS — BP 141/80 | HR 93 | Temp 98.6°F | Wt 236.6 lb

## 2016-03-16 DIAGNOSIS — M542 Cervicalgia: Secondary | ICD-10-CM

## 2016-03-16 DIAGNOSIS — R42 Dizziness and giddiness: Secondary | ICD-10-CM | POA: Diagnosis present

## 2016-03-16 DIAGNOSIS — K5900 Constipation, unspecified: Secondary | ICD-10-CM | POA: Diagnosis not present

## 2016-03-16 MED ORDER — SUMATRIPTAN SUCCINATE 50 MG PO TABS: ORAL_TABLET | ORAL | 5 refills | Status: DC

## 2016-03-16 NOTE — Assessment & Plan Note (Signed)
Will obtain [facility] records.  Pt counseled to make an appointment with behavioral health. Will also follow with PCP

## 2016-03-16 NOTE — Progress Notes (Signed)
   Subjective:    Patient ID: [name], female    DOB: [birth year], 40 y.o.   MRN: [id number]   CC: Headache, shoulder pain  HPI: 40 year old female presenting with migraine and shoulder pain   Migraine and shoulder pain - denies current headache - Migraine started 4 days ago, improves with Imitrex - Not constant nature - Starts from her neck and shoulders and radiates over her head to behind her eyes - Feels like she has blurry vision with her headaches, this is typical for migraine for her - Reports fatigue associated\ - She does have to commute via bus to class every day in the heat and she worries that some of her headache and fatigue are related to this - Additionally she feels that her shoulders are tight. She has taken muscle relaxants for this in past but has not taken any during this episode   Bipolar Depression - managed by [facility] and recently started on a new medication - She does not know the med but does report highs and lows -  When she feels low she denies SI/HI - Her next appointment with [facility] is in 3 months  Smoking status reviewed  Review of Systems Per HPI else denies headache, changes in vision, chest pain, shortness of breath, abdominal pain, N/V/D, weakness    Objective:  BP (!) 141/80 (BP Location: Left Arm, Patient Position: Sitting, Cuff Size: Large)   Pulse 93   Temp 98.6 F (37 C) (Oral)   Wt 236 lb 9.6 oz (107.3 kg)   SpO2 98%   BMI 34.94 kg/m  Vitals and nursing note reviewed  General: NAD Cardiac: RRR Respiratory: CTAB, normal effort Extremities: no edema or cyanosis. WWP. Skin: warm and dry, no rashes noted Neuro: alert and oriented   Assessment & Plan:    Migraine without aura Headache for the last 4 days, without current headache, without red flag symptoms. Likely exacerbated by stress and muscle tightness in her shoulders - Discussed using muscle relaxants for shoulder tightness but she reported fatigue and  sleepiness with taking these in past. Will recommend heating packs, Tylenol for shoulder pain  -Imitrex refilled as it has been helpful for her. Correct dosing and administration discussed -   DEPRESSION Will obtain [facility] records.  Pt counseled to make an appointment with behavioral health. Will also follow with PCP    [name] A. Kennon Rounds MD, MS Family Medicine Resident PGY-3 Pager [phone]

## 2016-03-16 NOTE — Assessment & Plan Note (Signed)
Headache for the last 4 days, without current headache, without red flag symptoms. Likely exacerbated by stress and muscle tightness in her shoulders - Discussed using muscle relaxants for shoulder tightness but she reported fatigue and sleepiness with taking these in past. Will recommend heating packs, Tylenol for shoulder pain  -Imitrex refilled as it has been helpful for her. Correct dosing and administration discussed -

## 2016-03-16 NOTE — Telephone Encounter (Signed)
Called the patient to discuss her XR neck results and inform her that it did not show any findings to explain her chronic pain, but she did not answer. Will call again

## 2016-03-16 NOTE — Patient Instructions (Signed)
Follow-up with PCP in 3 weeks (With behavioral health as soon as possible Please obtain x-ray of your neck as soon as possible Use heating pad for sore shoulders, take Zanaflex as able, take sumatriptan as prescribed for headaches with Tylenol If you have any questions or concerns call the office at [phone]

## 2016-03-18 ENCOUNTER — Ambulatory Visit (INDEPENDENT_AMBULATORY_CARE_PROVIDER_SITE_OTHER): Payer: Medicaid Other | Admitting: Licensed Clinical Social Worker

## 2016-03-18 DIAGNOSIS — F329 Major depressive disorder, single episode, unspecified: Secondary | ICD-10-CM

## 2016-03-18 DIAGNOSIS — F32A Depression, unspecified: Secondary | ICD-10-CM

## 2016-03-18 NOTE — Progress Notes (Signed)
Patient referred to [facility] by Dr. Kennon Rounds for depression    Report of symptoms: very easily irritated, "feels Like I am going to go off" and difficulty managing emotions. Symptoms are making it very difficult to get along with others. Depression screen [facility] 2/9 03/18/2016 12/29/2015 12/07/2015  Decreased Interest 1 1 1   Down, Depressed, Hopeless 2 0 2  PHQ - 2 Score 3 1 3   Altered sleeping 2 - 3  Tired, decreased energy 3 - 2  Change in appetite 2 - 2  Feeling bad or failure about yourself  1 - 0  Trouble concentrating 1 - 0  Moving slowly or fidgety/restless 1 - 1  Suicidal thoughts 1 - -  PHQ-9 Score 14 - 11  Difficult doing work/chores Very difficult - Somewhat difficult  PHQ-9 Score of 14 is an indication of major depression (mild).  Assessment Patient lives with her 39 yr old son and boyfriend of 20 years.  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  Per patient, she has a history of chronic depression and is followed by [facility] for medication management only. Has seen therapist on and off most of her life.  Last therapist was at [facility] about 6 months ago. Patient denies SI, HI, and substance use, last use was over 12 years ago.  Inpatient psych treatment and SI attempt around 2005.  Patient reported thoughts of SI, has no plan and would not act on thought due to son. Patient reports family hx of multi mental health disorders including her son. Per patient her strengths included being a good mom, and a good listener. On a scale of 1 to 10 with one being the worst she has felt patient states she is a 5.  Patient believes she could get to 6 if she was able to relax. Plan/Recommendations:   Patient would benefit from brief therapy to assist with managing daily stressors, emotions and learning relaxation techniques. Patient is in agreement to meet with [facility] to address her symptoms and establish goals. Patient's treatment plan was discussed and goals were established.  Interventions will  include a combination of Motivational Interviewing, Cognitive Behavioral therapy, Solution Focus and Behavior Activation.  Treatment will focused on establishing a therapeutic relationship, psycho-education, relaxation techniques and coping skills for stressors.  Patient was provide educational information on relaxation techniques as well as demonstrated during session. She was also provided with a calendar to schedule quiet time for herself.    Patient's Goals:  1. Manage feelings and emotions 2. Managing family related stressors  3. Learn to Relax 4. Have a better attitude  Patient is in agreement to utilize calendar to schedule 15 minutes of quiet time each day, implement the relaxation techniques and return to INT clinic next week to work on her goals.  Sammuel Hines, LCSW Licensed Clinical Social Worker [facility]   [phone] 12:32 PM

## 2016-03-23 ENCOUNTER — Ambulatory Visit: Payer: Medicaid Other

## 2016-03-24 ENCOUNTER — Other Ambulatory Visit: Payer: Self-pay | Admitting: Family Medicine

## 2016-03-24 ENCOUNTER — Other Ambulatory Visit (HOSPITAL_COMMUNITY): Payer: Self-pay | Admitting: Family Medicine

## 2016-03-24 ENCOUNTER — Encounter: Payer: Self-pay | Admitting: Internal Medicine

## 2016-03-25 ENCOUNTER — Ambulatory Visit (INDEPENDENT_AMBULATORY_CARE_PROVIDER_SITE_OTHER): Payer: Medicaid Other | Admitting: Psychology

## 2016-03-25 ENCOUNTER — Ambulatory Visit: Payer: Medicaid Other

## 2016-03-25 DIAGNOSIS — F3181 Bipolar II disorder: Secondary | ICD-10-CM

## 2016-03-25 DIAGNOSIS — F411 Generalized anxiety disorder: Secondary | ICD-10-CM

## 2016-03-25 NOTE — Assessment & Plan Note (Signed)
Patient reported a diagnosis of Bipolar Disorder Type II.  Depression was listed in the record so I changed it.  She takes Jordan and hydroxizine managed by Dr. (or PA) Alemu at [facility].  She said he added an additional medication that she has not yet started.  Possibly mitrazapine (she said it starts with an M and is supposed to help with sleep)??  Relied heavily on MI techniques to help understand the barriers to better health and well-being.  Per her report, she has early childhood trauma, several mental health issues (PTSD, Bipolar Disorder, and a history of substance abuse), and a challenging psychosocial situation.  Both son and boyfriend have significant mental health issues.  Highlighted and reinforced strengths and encouraged some experimentation with breathing and emotional awareness while also being honoring her challenging situation.    See patient instructions for further plan.

## 2016-03-25 NOTE — Assessment & Plan Note (Signed)
Mentioned having PTSD.  Did not go into significant detail today.  Likely playing a role in her irritability.

## 2016-03-25 NOTE — Patient Instructions (Addendum)
Please follow up with [name] as scheduled.  August 10th at 2:00. We talked about your stress / irritability today.  Thanks so much for sharing your story with [place].  A couple of things to keep in mind:  Sadness and fear typically are underneath anger.  When irritable, if you can pause for just a moment, you may notice the sadness and fear.  This paused, coupled with focused breathing (4 count through the nose is best but do what you can) can be helpful.  You seem like a really strong lady working hard for yourself and your family.  Keep experimenting with what helps you feel better and function well.

## 2016-03-25 NOTE — Progress Notes (Signed)
Reason for follow-up:  Following for stress management.  Saw Sammuel Hines recently and will follow with her again in a week.  Issues discussed:  Difficult to make progress on specific goals set out last visit with Gavin Pound.  Her son has significant mental health issues and Avory finds it difficult to get any time to herself even when asking.  Typically gets it when things escalate and her son recognizes it is best to give her space.    Focus on the breath as an anxiety reduction tool is difficult because of her asthma and / or allergies.  Tends to breathe very shallow and recognizes tightness across her chest and her shoulders / back area.  Identified goals:  Did not push to further identify goals today.  Did make some brief recommendations.

## 2016-03-29 ENCOUNTER — Telehealth: Payer: Self-pay | Admitting: *Deleted

## 2016-03-29 NOTE — Telephone Encounter (Signed)
Patient calling requesting to speak with triage nurse regarding an episode of abdominal pain she had this morning with diarrhea.

## 2016-03-29 NOTE — Telephone Encounter (Signed)
Return call to patient regarding the episode of diarrhea.  Patient reported have "gas" but it was diarrhea, watery and yellow.  The episode only happen once.  She was worried because of the color.  Patient denies n/v, abdominal pain or fever.  Advised patient to call for an appointment if diarrhea happens more than once, develop fever, n/v, severe abdominal pain and to keep herself well hydrated.  Patient also advised that it is hard to determine what the cause of the diarrhea.  Pt voiced understanding.  Clovis PuMartin, Tamika L, RN

## 2016-03-30 MED ORDER — TIZANIDINE HCL 4 MG PO CAPS: 4.0000 mg | ORAL_CAPSULE | Freq: Three times a day (TID) | ORAL | 0 refills | Status: DC

## 2016-03-30 MED ORDER — ONDANSETRON HCL 4 MG PO TABS: 4.0000 mg | ORAL_TABLET | Freq: Three times a day (TID) | ORAL | 0 refills | Status: DC | PRN

## 2016-03-31 ENCOUNTER — Ambulatory Visit: Payer: Medicaid Other

## 2016-04-05 ENCOUNTER — Ambulatory Visit: Payer: Medicaid Other | Admitting: Internal Medicine

## 2016-05-06 ENCOUNTER — Encounter: Payer: Self-pay | Admitting: Obstetrics and Gynecology

## 2016-05-06 ENCOUNTER — Ambulatory Visit (INDEPENDENT_AMBULATORY_CARE_PROVIDER_SITE_OTHER): Payer: Medicaid Other | Admitting: Obstetrics and Gynecology

## 2016-05-06 VITALS — BP 115/80 | HR 85 | Temp 98.5°F | Ht 69.0 in | Wt 233.2 lb

## 2016-05-06 DIAGNOSIS — H6091 Unspecified otitis externa, right ear: Secondary | ICD-10-CM

## 2016-05-06 DIAGNOSIS — R42 Dizziness and giddiness: Secondary | ICD-10-CM | POA: Diagnosis present

## 2016-05-06 DIAGNOSIS — H9201 Otalgia, right ear: Secondary | ICD-10-CM

## 2016-05-06 DIAGNOSIS — K5900 Constipation, unspecified: Secondary | ICD-10-CM | POA: Diagnosis not present

## 2016-05-06 MED ORDER — NEOMYCIN-POLYMYXIN-HC 3.5-10000-1 OT SOLN: 3.0000 [drp] | Freq: Three times a day (TID) | OTIC | 1 refills | Status: DC

## 2016-05-06 NOTE — Progress Notes (Signed)
   Subjective:   Patient ID: [name], female    DOB: 07/23/1976, 40 y.o.   MRN: [id number]  Patient presents for Same Day Appointment  Chief Complaint  Patient presents with  . Otalgia    HPI: # EAR PAIN History of ear problems Takes all kinds of OTC meds for ears with no relief Location: right ear Itching associated with pain small boil on around entrance into ear that had some draining pus Pain in neck/face Now for about a week Recent ear trauma: no Antibiotics in the last 30 days: no History of diabetes: no  Symptoms Ear discharge: yes Fever: no Pain with chewing: no Ringing in ears: no Dizziness: no Hearing loss: no  Review of Systems   See HPI for ROS.   Smoking status - Current Smoker  Past medical history, surgical, family, and social history reviewed and updated in the EMR as appropriate.  Objective:  BP 115/80   Pulse 85   Temp 98.5 F (36.9 C) (Oral)   Wt 233 lb 3.2 oz (105.8 kg)   LMP 04/01/2016 (Approximate)   BMI 34.44 kg/m  Vitals and nursing note reviewed  Physical Exam  Constitutional: She is well-developed, well-nourished, and in no distress.  HENT:  Head: Normocephalic and atraumatic.  Right Ear: Hearing and tympanic membrane normal. There is drainage, swelling and tenderness.  Left Ear: Hearing, tympanic membrane, external ear and ear canal normal.  Right ear canal with swelling, tenderness, and erythema. Some dried serosanguinous drainage appreciated in canal.     Assessment & Plan:  1. Otitis externa, right Patient with otitis externa of right ear. Has a chronic history of ear drainage, itching and multiple visits with allergist. Afebrile and well-appearing today.  - Prescribed Cortisporin otic solution today to be used in ear 3 times a day -due to chronic nature of condition and possibility of this turning into a fungal infection encouraged patient to follow-up with her ENT doctor.  - Follow-up in 1-2 weeks, with PCP if no  resolution of symptoms.  - Handout given with return precautions.  Meds ordered this encounter  Medications  . neomycin-polymyxin-hydrocortisone (CORTISPORIN) otic solution    Sig: Place 3 drops into the right ear 3 (three) times daily.    Dispense:  10 mL    Refill:  1     Caryl AdaJazma Phelps, DO 05/06/2016, 9:50 AM PGY-3, [facility]

## 2016-05-06 NOTE — Patient Instructions (Signed)
Otitis Externa Otitis externa is a germ infection in the outer ear. The outer ear is the area from the eardrum to the outside of the ear. Otitis externa is sometimes called "swimmer's ear." HOME CARE  Put drops in the ear as told by your doctor.  Only take medicine as told by your doctor.  If you have diabetes, your doctor may give you more directions. Follow your doctor's directions.  Keep all doctor visits as told. To avoid another infection:  Keep your ear dry. Use the corner of a towel to dry your ear after swimming or bathing.  Avoid scratching or putting things inside your ear.  Avoid swimming in lakes, dirty water, or pools that use a chemical called chlorine poorly.  You may use ear drops after swimming. Combine equal amounts of white vinegar and alcohol in a bottle. Put 3 or 4 drops in each ear. GET HELP IF:   You have a fever.  Your ear is still red, puffy (swollen), or painful after 3 days.  You still have yellowish-white fluid (pus) coming from the ear after 3 days.  Your redness, puffiness, or pain gets worse.  You have a really bad headache.  You have redness, puffiness, pain, or tenderness behind your ear. MAKE SURE YOU:   Understand these instructions.  Will watch your condition.  Will get help right away if you are not doing well or get worse.   This information is not intended to replace advice given to you by your health care provider. Make sure you discuss any questions you have with your health care provider.   Document Released: 01/25/2008 Document Revised: 08/29/2014 Document Reviewed: 08/25/2011 Elsevier Interactive Patient Education 2016 [employer] Inc.  

## 2016-06-09 ENCOUNTER — Encounter: Payer: Self-pay | Admitting: Family Medicine

## 2016-06-09 ENCOUNTER — Ambulatory Visit (INDEPENDENT_AMBULATORY_CARE_PROVIDER_SITE_OTHER): Payer: Medicaid Other | Admitting: Family Medicine

## 2016-06-09 DIAGNOSIS — K5900 Constipation, unspecified: Secondary | ICD-10-CM | POA: Diagnosis not present

## 2016-06-09 DIAGNOSIS — H6093 Unspecified otitis externa, bilateral: Secondary | ICD-10-CM

## 2016-06-09 DIAGNOSIS — R42 Dizziness and giddiness: Secondary | ICD-10-CM | POA: Diagnosis present

## 2016-06-09 MED ORDER — NEOMYCIN-POLYMYXIN-HC 3.5-10000-1 OT SOLN: 3.0000 [drp] | Freq: Three times a day (TID) | OTIC | 1 refills | Status: DC

## 2016-06-09 NOTE — Assessment & Plan Note (Addendum)
Chronic. Right ear resolved but continues to exhibit pruritis. Left ear has serous drainage without purulence or hemorrhaging. Likely bacterial vs fungal. No signs of mastoiditis. Right ear responded well to Cortisporin. Will continue same treatment for left ear. - Cortisporin 3 drops TID for x1 week - Avoid insertion of cuetips and irritation of left ear - [facility] - Will irrigate ear once infection is cleared - Chronic irritation of ear may have disrupted normal cerumen in ear, would benefit from hydrocortisone cream during f/u to decrease itching - Consider ENT referral during next visit as her old ENT has retired last year

## 2016-06-09 NOTE — Patient Instructions (Addendum)
It was a pleasure to meet you today. Please see below to review our plan for today's visit.  1. You right ear looks improved since your last visit. The left ear problem is likely the same issue and should respond to the same treatment. 2. Please place 3 drops of the Cortisporin into left ear three times per day for 1 week. You will continue having discomfort, itching, and your ear will drain, but the drops need to be placed in order for the ear to heal. 3. F/u in 1 week. We will clean your ear out during the next visit and start hydrocortisone cream to help relieve itching and redness.  Please call the clinic at [phone] if your symptoms worsen or you have any concerns. It was my pleasure to see you. -- Durward Parcelavid McMullen, DO [facility], PGY-1   Otitis Externa Otitis externa is a bacterial or fungal infection of the outer ear canal. This is the area from the eardrum to the outside of the ear. Otitis externa is sometimes called "swimmer's ear." CAUSES  Possible causes of infection include:  Swimming in dirty water.  Moisture remaining in the ear after swimming or bathing.  Mild injury (trauma) to the ear.  Objects stuck in the ear (foreign body).  Cuts or scrapes (abrasions) on the outside of the ear. SIGNS AND SYMPTOMS  The first symptom of infection is often itching in the ear canal. Later signs and symptoms may include swelling and redness of the ear canal, ear pain, and yellowish-white fluid (pus) coming from the ear. The ear pain may be worse when pulling on the earlobe. DIAGNOSIS  Your health care provider will perform a physical exam. A sample of fluid may be taken from the ear and examined for bacteria or fungi. TREATMENT  Antibiotic ear drops are often given for 10 to 14 days. Treatment may also include pain medicine or corticosteroids to reduce itching and swelling. HOME CARE INSTRUCTIONS   Apply antibiotic ear drops to the ear canal as prescribed by your  health care provider.  Take medicines only as directed by your health care provider.  If you have diabetes, follow any additional treatment instructions from your health care provider.  Keep all follow-up visits as directed by your health care provider. PREVENTION   Keep your ear dry. Use the corner of a towel to absorb water out of the ear canal after swimming or bathing.  Avoid scratching or putting objects inside your ear. This can damage the ear canal or remove the protective wax that lines the canal. This makes it easier for bacteria and fungi to grow.  Avoid swimming in lakes, polluted water, or poorly chlorinated pools.  You may use ear drops made of rubbing alcohol and vinegar after swimming. Combine equal parts of white vinegar and alcohol in a bottle. Put 3 or 4 drops into each ear after swimming. SEEK MEDICAL CARE IF:   You have a fever.  Your ear is still red, swollen, painful, or draining pus after 3 days.  Your redness, swelling, or pain gets worse.  You have a severe headache.  You have redness, swelling, pain, or tenderness in the area behind your ear. MAKE SURE YOU:   Understand these instructions.  Will watch your condition.  Will get help right away if you are not doing well or get worse.   This information is not intended to replace advice given to you by your health care provider. Make sure you discuss  any questions you have with your health care provider.   Document Released: 08/08/2005 Document Revised: 08/29/2014 Document Reviewed: 08/25/2011 Elsevier Interactive Patient Education [employer]

## 2016-06-09 NOTE — Progress Notes (Signed)
Subjective:   Patient ID: [name]    DOB: 01-Apr-1976, 40 y.o. female   MRN: [id number]  CC: Left Ear Pain  HPI: [name] is a 40 y.o. female who presents to clinic today SDA concerning left ear pain. Problems discussed today are as follows:  Left Ear Pain: patient states she has been experiencing "ear problems" for many years. Was previously seeing ENT. [facility] for RIGHT ear pain and was given Cortisporin ear drops for suspected otitis externa. Patient completed drops for 1 week with resolution of drainage and pain but itching persists. As of 1 week ago, her LEFT ear has been draining clear to bloody drainage with itching and pain. Has been taking ibuprofen with relief but has not used Cortisporin in her left ear. Says she has difficulty hearing out of left ear. Denies fevers, chills, nausea, vomiting, jaw claudication, headache, change in vision, or dysphagia.  ROS: See HPI for pertinent ROS.  PMFSH: Pertinent past medical, surgical, family, and social history were reviewed and updated as appropriate. Smoking status reviewed.  Medications reviewed. Current Outpatient Prescriptions  Medication Sig Dispense Refill  . docusate sodium (COLACE) 100 MG capsule Take 1 capsule (100 mg total) by mouth 2 (two) times daily. 60 capsule prn  . EPINEPHrine (EPIPEN) 0.3 mg/0.3 mL IJ SOAJ injection Inject 0.3 mLs (0.3 mg total) into the muscle once. 1 Device 1  . fluticasone (FLONASE) 50 MCG/ACT nasal spray Place 2 sprays into both nostrils daily. 16 g 6  . hydrochlorothiazide (MICROZIDE) 12.5 MG capsule Take 1 capsule (12.5 mg total) by mouth daily. 90 capsule 3  . hydrOXYzine (ATARAX/VISTARIL) 10 MG tablet Take 10mg  nightly at 7pm if itching is severe. May cause drowsiness. 30 tablet 0  . ipratropium (ATROVENT) 0.03 % nasal spray Place 2 sprays into the nose every 12 (twelve) hours. 30 mL 12  . loratadine (CLARITIN) 10 MG tablet Take 1 tablet (10 mg total) by mouth daily.  (Patient taking differently: Take 10 mg by mouth daily as needed for allergies. ) 30 tablet 11  . lurasidone (LATUDA) 40 MG TABS tablet Take 40 mg by mouth at bedtime.    . meclizine (ANTIVERT) 12.5 MG tablet Take 1 tablet (12.5 mg total) by mouth 3 (three) times daily as needed for dizziness. 30 tablet 1  . montelukast (SINGULAIR) 10 MG tablet Take 1 tablet (10 mg total) by mouth at bedtime. 30 tablet 3  . mupirocin ointment (BACTROBAN) 2 % Apply three times daily to affected skin 22 g 0  . neomycin-polymyxin-hydrocortisone (CORTISPORIN) otic solution Place 3 drops into the left ear 3 (three) times daily. 10 mL 1  . Olopatadine HCl 0.2 % SOLN Apply 1 drop to eye daily. Into each affected eye. (Patient taking differently: Place 1 drop into both eyes daily as needed. For itching) 1 Bottle 2  . omeprazole (PRILOSEC) 20 MG capsule Take 1 capsule (20 mg total) by mouth daily. 30 capsule 2  . ondansetron (ZOFRAN) 4 MG tablet Take 1 tablet (4 mg total) by mouth every 8 (eight) hours as needed for nausea or vomiting. 20 tablet 0  . polyethylene glycol (MIRALAX / GLYCOLAX) packet Take 17 g by mouth 2 (two) times daily. Until stooling regularly 100 packet 2  . ranitidine (ZANTAC) 150 MG tablet TAKE 1 TABLET BY MOUTH EVERY DAY 30 tablet 2  . SUMAtriptan (IMITREX) 50 MG tablet TAKE 1 TABLET BY MOUTH FOR HEADACHE. MAY REPEAT IN 2 HOURS IF HEADACHE  PERSIST 10 tablet 5  . tiZANidine (ZANAFLEX) 4 MG capsule Take 1 capsule (4 mg total) by mouth 3 (three) times daily. Do not take with flexeril 30 capsule 0  . traMADol (ULTRAM) 50 MG tablet Take 1 tablet (50 mg total) by mouth daily as needed. 30 tablet 0   No current facility-administered medications for this visit.     Objective:   BP 124/80   Pulse 95   Temp 98.9 F (37.2 C) (Oral)   Ht 5\' 9"  (1.753 m)   Wt 237 lb (107.5 kg)   LMP 05/22/2016 (Approximate)   SpO2 97%   BMI 35.00 kg/m  Vitals and nursing note reviewed.  General: well nourished, well  developed, in no acute distress with non-toxic appearance HEENT: normocephalic, atraumatic, moist mucous membranes, right ear with dry impaction around canal with grey TM without discharge or erythema, left ear is moist with clear drainage and soft impaction around canal with intact non-erythematous TM, tender with tug of tragus on left ear without edematous or tender mastoid process Neck: supple, non-tender without lymphadenopathy CV: regular rate and rhythm without murmurs, rubs, or gallops Lungs: clear to auscultation bilaterally with normal work of breathing Abdomen: soft, non-tender, non-distended, no masses or organomegaly palpable, normoactive bowel sounds Skin: warm, dry, no rashes or lesions, cap refill < 2 seconds Extremities: warm and well perfused, normal tone  Assessment & Plan:   Bilateral external ear infections Chronic. Right ear resolved but continues to exhibit pruritis. Left ear has serous drainage without purulence or hemorrhaging. Likely bacterial vs fungal. No signs of mastoiditis. Right ear responded well to Cortisporin. Will continue same treatment for left ear. - Cortisporin 3 drops TID for x1 week - Avoid insertion of cuetips and irritation of left ear - Return to [facility] in 1 week - Will irrigate ear once infection is cleared - Chronic irritation of ear may have disrupted normal cerumen in ear, would benefit from hydrocortisone cream during f/u to decrease itching - Consider ENT referral during next visit as her old ENT has retired last year  No orders of the defined types were placed in this encounter.  Meds ordered this encounter  Medications  . neomycin-polymyxin-hydrocortisone (CORTISPORIN) otic solution    Sig: Place 3 drops into the left ear 3 (three) times daily.    Dispense:  10 mL    Refill:  1    Durward Parcel, DO [facility], PGY-1 06/09/2016 2:07 PM

## 2016-06-17 ENCOUNTER — Ambulatory Visit: Payer: Medicaid Other | Admitting: Internal Medicine

## 2016-06-20 ENCOUNTER — Ambulatory Visit (INDEPENDENT_AMBULATORY_CARE_PROVIDER_SITE_OTHER): Payer: Medicaid Other | Admitting: Internal Medicine

## 2016-06-20 DIAGNOSIS — H66005 Acute suppurative otitis media without spontaneous rupture of ear drum, recurrent, left ear: Secondary | ICD-10-CM

## 2016-06-20 DIAGNOSIS — R42 Dizziness and giddiness: Secondary | ICD-10-CM | POA: Diagnosis present

## 2016-06-20 DIAGNOSIS — H6690 Otitis media, unspecified, unspecified ear: Secondary | ICD-10-CM | POA: Insufficient documentation

## 2016-06-20 DIAGNOSIS — K5900 Constipation, unspecified: Secondary | ICD-10-CM | POA: Diagnosis not present

## 2016-06-20 DIAGNOSIS — I10 Essential (primary) hypertension: Secondary | ICD-10-CM

## 2016-06-20 MED ORDER — AMOXICILLIN 500 MG PO CAPS: 500.0000 mg | ORAL_CAPSULE | Freq: Two times a day (BID) | ORAL | 0 refills | Status: DC

## 2016-06-20 NOTE — Patient Instructions (Addendum)
You likely have an infection of the middle ear-- We will treat you with antibiotics for 7 days. Please follow up in a week or so to recheck hearing to see if you have improvement.

## 2016-06-20 NOTE — Assessment & Plan Note (Addendum)
Slight purulence in left ear, subjective decrease in hearing. Vibratory sound was greater on the left> right and indicated possible fluid in the middle ear. Therefore, patient will likely with some level of infection in left ear. Discussed with Dr. McDiramid.  - Provide amoxicillin 500 mg BID for 7 day - Audiometry was normal  - Follow up as needed  - If no improvement, could consider ENT referral

## 2016-06-20 NOTE — Progress Notes (Signed)
   [facility] [name], MD Phone: [phone]  Reason For Visit: f/u ear irritation/HTN   # Ear Irritation  Recently treated for otitis sterna on October 19. Patient at that time had drainage from the ears. However currently denies having any more drainage. States that she still has decreased hearing in the left ear compared to the right. She states that her ears are itchy. Denies any other symptoms of pain. Indicates that she has had many year of ear associated problems and had significant seasonal allergies. She has been seen by ENT in the past.   CHRONIC HTN: Reports  Current Meds - hydrochlorothiazide  Reports good compliance, did not take meds today. Tolerating well, w/o complaints. Lifestyle - walks daily, needs to improve her diet  Denies CP, dyspnea, HA, edema, dizziness / lightheadedness  Past Medical History Reviewed problem list.  Medications- reviewed and updated No additions to family history Social history- patient is a non smoker  Objective: BP 139/81 (BP Location: Left Arm, Patient Position: Sitting, Cuff Size: Normal)   Pulse 71   Temp 98.7 F (37.1 C) (Oral)   Wt 233 lb 3.2 oz (105.8 kg)   LMP 05/22/2016 (Approximate)   SpO2 100%   BMI 34.44 kg/m  Gen: NAD, alert, cooperative with exam HEENT: Normal    Neck: No masses palpated. No lymphadenopathy    Ears: Tympanic membranes on the left with possibly small pockets purulents behind the tympanic membrane, with tuning fork- vibratory sound was great on left compared to right.     Nose: nasal turbinates moist    Throat: moist mucus membranes, no erythema Cardio: regular rate and rhythm, S1S2 heard, no murmurs appreciated Skin: drying around the pinna of bilateral ears    Assessment/Plan: See problem based a/p  Essential hypertension, benign Unable to evaluate as patient did not take her medication today - Follow up in the next month, bring in medications  - Revaluate at that  point   Otitis media Slight purulence in left ear, subjective decrease in hearing. Vibratory sound was greater on the left> right and indicated possible fluid in the middle ear. Therefore, patient will likely with some level of infection in left ear. Discussed with Dr. McDiramid.  - Provide amoxicillin 500 mg BID for 7 day - Audiometry was normal  - Follow up as needed  - If no improvement, could consider ENT referral

## 2016-06-20 NOTE — Assessment & Plan Note (Signed)
Unable to evaluate as patient did not take her medication today - Follow up in the next month, bring in medications  - Revaluate at that point

## 2016-07-30 ENCOUNTER — Emergency Department (HOSPITAL_COMMUNITY)
Admission: EM | Admit: 2016-07-30 | Discharge: 2016-07-30 | Disposition: A | Discharge status: Home / Self Care | Payer: Medicaid Other | Attending: Emergency Medicine | Admitting: Emergency Medicine

## 2016-07-30 ENCOUNTER — Encounter (HOSPITAL_COMMUNITY): Payer: Self-pay

## 2016-07-30 DIAGNOSIS — F17210 Nicotine dependence, cigarettes, uncomplicated: Secondary | ICD-10-CM | POA: Diagnosis not present

## 2016-07-30 DIAGNOSIS — Y929 Unspecified place or not applicable: Secondary | ICD-10-CM | POA: Diagnosis not present

## 2016-07-30 DIAGNOSIS — Z91040 Latex allergy status: Secondary | ICD-10-CM | POA: Diagnosis not present

## 2016-07-30 DIAGNOSIS — S00411A Abrasion of right ear, initial encounter: Secondary | ICD-10-CM

## 2016-07-30 DIAGNOSIS — Y939 Activity, unspecified: Secondary | ICD-10-CM | POA: Insufficient documentation

## 2016-07-30 DIAGNOSIS — Y999 Unspecified external cause status: Secondary | ICD-10-CM | POA: Diagnosis not present

## 2016-07-30 DIAGNOSIS — I10 Essential (primary) hypertension: Secondary | ICD-10-CM | POA: Diagnosis not present

## 2016-07-30 DIAGNOSIS — S0991XA Unspecified injury of ear, initial encounter: Secondary | ICD-10-CM | POA: Diagnosis present

## 2016-07-30 DIAGNOSIS — X58XXXA Exposure to other specified factors, initial encounter: Secondary | ICD-10-CM | POA: Diagnosis not present

## 2016-07-30 DIAGNOSIS — H9201 Otalgia, right ear: Secondary | ICD-10-CM

## 2016-07-30 MED ORDER — IBUPROFEN 800 MG PO TABS: 800.0000 mg | ORAL_TABLET | Freq: Three times a day (TID) | ORAL | 0 refills | Status: DC | PRN

## 2016-07-30 MED ORDER — OFLOXACIN 0.3 % OT SOLN: 5.0000 [drp] | Freq: Every day | OTIC | 0 refills | Status: DC

## 2016-07-30 MED ORDER — TRAMADOL HCL 50 MG PO TABS: 50.0000 mg | ORAL_TABLET | Freq: Four times a day (QID) | ORAL | 0 refills | Status: DC | PRN

## 2016-07-30 NOTE — ED Triage Notes (Addendum)
PT. WAS DOING HER HAIR WITH A POINTY COMB AND SOMEHOW SLIPPED AND TH END OF THE COMB WENT INTO HER RT. EAR.  PT. PULLED IT OUT AND WAS BLEEDING. PT. REPORTS THAT IT IS MUFFLED SOUNDS.  THE PAIN RADIATES INTO HER RT. FACE. NO ACTIVE BLEEDING NOTED. PT HAS A COTTON BALL IN THE EAR

## 2016-07-30 NOTE — ED Notes (Signed)
Declined W/C at D/C and was escorted to lobby by RN. 

## 2016-07-30 NOTE — Discharge Instructions (Signed)
Return here as needed.  Follow-up with the specialist provided as needed

## 2016-07-30 NOTE — ED Provider Notes (Signed)
[facility] Provider Note   CSN: [id number] Arrival date & time: 07/30/16  1416  By signing my name below, I, Emmanuella Mensah, attest that this documentation has been prepared under the direction and in the presence of BoeingChris Elija Mccamish, PA-C. Electronically Signed: Angelene GiovanniEmmanuella Mensah, ED Scribe. 07/30/16. 3:10 PM.   History   Chief Complaint Chief Complaint  Patient presents with  . Otalgia    HPI Comments: [name] is a 40 y.o. female with a hx pf hypertension who presents to the Emergency Department complaining of sudden onset, persistent bleeding from right ear with moderate pain s/p ear injury that occurred PTA. She explains that she was using a long tail comb on her hair when she accidentally bumped into a wall, causing the comb to enter her right ear. She denies any LOC. No alleviating factors noted. Pt has not tried any medications PTA. She has an allergy to Aripiprazole, escitalopram oxalate, and latex. She denies any fever, chills, nausea, vomiting, or any other symptoms.   The history is provided by the patient. No language interpreter was used.    Past Medical History:  Diagnosis Date  . Hypertension   . Migraine   . Vertigo     Patient Active Problem List   Diagnosis Date Noted  . Otitis media 06/20/2016  . Bipolar 2 disorder (HCC) 03/25/2016  . Bilateral external ear infections 09/11/2014  . Essential hypertension, benign 09/27/2013  . GERD (gastroesophageal reflux disease) 05/02/2013  . Chronic lumbar pain 10/02/2012  . Migraine without aura 07/05/2012  . Anxiety state 08/18/2010  . TOBACCO ABUSE 08/18/2010  . OBESITY NOS 01/26/2007  . ALLERGIC RHINITIS, SEASONAL 01/26/2007    History reviewed. No pertinent surgical history.  OB History    No data available       Home Medications    Prior to Admission medications   Medication Sig Start Date End Date Taking? Authorizing Provider  amoxicillin (AMOXIL) 500 MG capsule Take 1 capsule (500  mg total) by mouth 2 (two) times daily. 06/20/16   Asiyah Mayra ReelZahra Mikell, MD  docusate sodium (COLACE) 100 MG capsule Take 1 capsule (100 mg total) by mouth 2 (two) times daily. 07/29/14   Leona SingletonMaria T Thekkekandam, MD  EPINEPHrine (EPIPEN) 0.3 mg/0.3 mL IJ SOAJ injection Inject 0.3 mLs (0.3 mg total) into the muscle once. 02/20/14   Leona SingletonMaria T Thekkekandam, MD  fluticasone (FLONASE) 50 MCG/ACT nasal spray Place 2 sprays into both nostrils daily. 12/08/14   Leona SingletonMaria T Thekkekandam, MD  hydrochlorothiazide (MICROZIDE) 12.5 MG capsule Take 1 capsule (12.5 mg total) by mouth daily. 09/01/15   Nani RavensAndrew M Wight, MD  hydrOXYzine (ATARAX/VISTARIL) 10 MG tablet Take 10mg  nightly at 7pm if itching is severe. May cause drowsiness. 01/09/14   Leona SingletonMaria T Thekkekandam, MD  ipratropium (ATROVENT) 0.03 % nasal spray Place 2 sprays into the nose every 12 (twelve) hours. 03/29/13   Rodolph BongEvan S Corey, MD  loratadine (CLARITIN) 10 MG tablet Take 1 tablet (10 mg total) by mouth daily. Patient taking differently: Take 10 mg by mouth daily as needed for allergies.  02/12/14   Leona SingletonMaria T Thekkekandam, MD  lurasidone (LATUDA) 40 MG TABS tablet Take 40 mg by mouth at bedtime.    Historical Provider, MD  meclizine (ANTIVERT) 12.5 MG tablet Take 1 tablet (12.5 mg total) by mouth 3 (three) times daily as needed for dizziness. 03/16/15   Nani RavensAndrew M Wight, MD  montelukast (SINGULAIR) 10 MG tablet Take 1 tablet (10 mg total) by mouth at bedtime.  05/21/14   Reva Bores, MD  mupirocin ointment Idelle Jo) 2 % Apply three times daily to affected skin 01/01/16   Nani Ravens, MD  neomycin-polymyxin-hydrocortisone (CORTISPORIN) otic solution Place 3 drops into the left ear 3 (three) times daily. 06/09/16   Wendee Beavers, DO  Olopatadine HCl 0.2 % SOLN Apply 1 drop to eye daily. Into each affected eye. Patient taking differently: Place 1 drop into both eyes daily as needed. For itching 02/24/14   Leona Singleton, MD  omeprazole (PRILOSEC) 20 MG capsule Take 1  capsule (20 mg total) by mouth daily. 12/07/15   Nani Ravens, MD  ondansetron (ZOFRAN) 4 MG tablet Take 1 tablet (4 mg total) by mouth every 8 (eight) hours as needed for nausea or vomiting. 03/30/16   Asiyah Mayra Reel, MD  polyethylene glycol (MIRALAX / GLYCOLAX) packet Take 17 g by mouth 2 (two) times daily. Until stooling regularly 12/07/15   Nani Ravens, MD  ranitidine (ZANTAC) 150 MG tablet TAKE 1 TABLET BY MOUTH EVERY DAY 11/27/15   Nani Ravens, MD  SUMAtriptan (IMITREX) 50 MG tablet TAKE 1 TABLET BY MOUTH FOR HEADACHE. MAY REPEAT IN 2 HOURS IF HEADACHE PERSIST 03/16/16   Bonney Aid, MD  tiZANidine (ZANAFLEX) 4 MG capsule Take 1 capsule (4 mg total) by mouth 3 (three) times daily. Do not take with flexeril 03/30/16   Asiyah Mayra Reel, MD  traMADol (ULTRAM) 50 MG tablet Take 1 tablet (50 mg total) by mouth daily as needed. 11/02/15   Nani Ravens, MD    Family History No family history on file.  Social History Social History  Substance Use Topics  . Smoking status: Current Every Day Smoker    Packs/day: 0.50    Years: 19.00    Types: Cigarettes  . Smokeless tobacco: Never Used  . Alcohol use No     Allergies   Aripiprazole; Escitalopram oxalate; and Latex   Review of Systems Review of Systems  Constitutional: Negative for chills and fever.  HENT: Positive for ear discharge (blood) and ear pain.   Gastrointestinal: Negative for nausea and vomiting.  All other systems reviewed and are negative.    Physical Exam Updated Vital Signs BP 136/90 (BP Location: Right Arm)   Pulse 98   Temp 98.7 F (37.1 C) (Oral)   Resp 18   Ht 5\' 9"  (1.753 m)   Wt 230 lb (104.3 kg)   SpO2 99%   BMI 33.97 kg/m   Physical Exam  Constitutional: She is oriented to person, place, and time. She appears well-developed and well-nourished. No distress.  HENT:  Head: Normocephalic and atraumatic.  Right Ear: Tympanic membrane normal. Tympanic membrane is not perforated.   Ears:  Eyes: Conjunctivae are normal. Pupils are equal, round, and reactive to light.  Pulmonary/Chest: Effort normal.  Neurological: She is alert and oriented to person, place, and time.  Skin: Skin is warm and dry. No rash noted.  Psychiatric: She has a normal mood and affect.  Nursing note and vitals reviewed.    ED Treatments / Results  DIAGNOSTIC STUDIES: Oxygen Saturation is 99% on RA, normal by my interpretation.    COORDINATION OF CARE: 2:59 PM- Pt advised of plan for treatment and pt agrees.    Labs (all labs ordered are listed, but only abnormal results are displayed) Labs Reviewed - No data to display  EKG  EKG Interpretation None       Radiology No results found.  Procedures Procedures (including critical care time)  Medications Ordered in ED Medications - No data to display   Initial Impression / Assessment and Plan / ED Course  Ebbie Ridgehris Jassmine Vandruff, PA-C has reviewed the triage vital signs and the nursing notes.  Pertinent labs & imaging results that were available during my care of the patient were reviewed by me and considered in my medical decision making (see chart for details).  Clinical Course     Patient is referred to ENT.  She is also advised to return here as needed.  Advised her of the plan and all questions were answered.  They gave her antibiotic eardrops as well  Final Clinical Impressions(s) / ED Diagnoses   Final diagnoses:  None    New Prescriptions New Prescriptions   No medications on file      Charlestine NightChristopher Brinklee Cisse, PA-C 07/30/16 1615    Shaune Pollackameron Isaacs, MD 07/31/16 [phone]

## 2016-08-27 ENCOUNTER — Other Ambulatory Visit: Payer: Self-pay | Admitting: Family Medicine

## 2016-09-17 ENCOUNTER — Other Ambulatory Visit: Payer: Self-pay | Admitting: Family Medicine

## 2016-12-10 ENCOUNTER — Telehealth: Payer: Self-pay | Admitting: Internal Medicine

## 2016-12-10 NOTE — Telephone Encounter (Signed)
**  After Hours/ Emergency Line Call*  Received from  [facility]  Last two days stomach has been distended/bloated, heartburn, and constipation. Has prior history of symptoms. Omeprazole did not help. Used miralax daily but no BM. Had episode of emesis today (small) nonbloody and nonbilious. Drinking water without issues. Last BM 3 days ago; usually does not go daily but maybe 1-2 times a week. No fevers or chills. No abdominal pain.   Recommended to make sure she hydrates herself. Try Miralax BID and TUMs. Appointment made on 4/23 at 10:15AM with Dr. Wende Mott. Red flags discussed.   Palma Holter, MD PGY-2, [facility]

## 2016-12-12 ENCOUNTER — Ambulatory Visit (INDEPENDENT_AMBULATORY_CARE_PROVIDER_SITE_OTHER): Payer: Medicaid Other | Admitting: Family Medicine

## 2016-12-12 ENCOUNTER — Encounter: Payer: Self-pay | Admitting: Family Medicine

## 2016-12-12 DIAGNOSIS — K5900 Constipation, unspecified: Secondary | ICD-10-CM | POA: Diagnosis present

## 2016-12-12 MED ORDER — GLYCERIN (LAXATIVE) 1.2 G RE SUPP: 1.0000 | Freq: Every day | RECTAL | 1 refills | Status: DC | PRN

## 2016-12-12 MED ORDER — PSYLLIUM 58.6 % PO PACK: 1.0000 | PACK | Freq: Two times a day (BID) | ORAL | 5 refills | Status: DC

## 2016-12-12 MED ORDER — POLYETHYLENE GLYCOL 3350 17 G PO PACK: 17.0000 g | PACK | Freq: Three times a day (TID) | ORAL | 2 refills | Status: DC

## 2016-12-12 NOTE — Progress Notes (Signed)
   HPI  CC: Constipation Patient is here with complaints of persistent constipation. She states that her last BM was 2 days ago but before then had been about a week. This is not a new issue for her. She states that she regularly takes MiraLAX twice a day. This does not seem to help much. She occasionally purchases a OTC enema but, for obvious reasons, does not like to rely on this. She endorses some occasional emesis, nonbloody nonbilious, containing only gastric contents. She endorses large, very hard, very painful bowel movements when she has them. She endorses regular flatulence.  Patient states that she drinks a lot of water every day. She states that her diet "could be better". She eats a lot of chicken, rice, mac & cheese, collard greens, and spinach. Due to much of her abdominal discomfort she only eats one meal a day. Patient does not exercise regularly.  Review of Systems See HPI for ROS.   CC, SH/smoking status, and VS noted  Objective: BP 132/82   Pulse 91   Temp 98.6 F (37 C) (Oral)   Wt 231 lb (104.8 kg)   LMP 11/20/2016   BMI 34.11 kg/m  Gen: NAD, alert, cooperative, and pleasant. CV: RRR, no murmur Resp: CTAB, no wheezes, non-labored Abd: S, global discomfort greatest along the left side but no actual tenderness, ND, BS present, no guarding or organomegaly   Assessment and plan:  Constipation Patient is here with signs and symptoms most consistent with constipation. No red flags symptoms at this time. Patient has been doing this for many years. Patient also stated that her son has a same issue. Patient's diet likely a strong contributor to this issue. - Continue MiraLAX 3 times a day - Initiate Metamucil twice a day - Glycerin suppository when necessary - Encouraged increase fluid intake - Encouraged increase fiber intake - Patient is to titrate Metamucil/MiraLAX with a target of 1-2 soft but not loose stools a day. - Return precautions discussed - Follow-up  with PCP   Meds ordered this encounter  Medications  . polyethylene glycol (MIRALAX / GLYCOLAX) packet    Sig: Take 17 g by mouth 3 (three) times daily. Until stooling regularly    Dispense:  100 packet    Refill:  2  . psyllium (METAMUCIL) 58.6 % packet    Sig: Take 1 packet by mouth 2 (two) times daily.    Dispense:  54 each    Refill:  5  . glycerin, Pediatric, 1.2 g SUPP    Sig: Place 1 suppository (1.2 g total) rectally daily as needed for moderate constipation.    Dispense:  10 suppository    Refill:  1     Elberta Leatherwood, MD,MS,  PGY3 12/12/2016 6:20 PM

## 2016-12-12 NOTE — Patient Instructions (Signed)
It was a pleasure seeing you today in our clinic. Today we discussed your abdominal discomfort and constipation. Here is the treatment plan we have discussed and agreed upon together:   The overall goal of our treatment is for you to have 1-2 loose but not watery stools every day. If you find that you are not reaching this goal and you may increase the frequency or dosing of the Metamucil (first), or the MiraLAX (second). If you are having too frequent or watery stools. You can decrease the frequency of the MiraLAX. - I would like for you to start taking your MiraLAX 3 times a day. - I have started you on Metamucil. Take this twice a day. - I have sent in for some glycerin suppositories. Take this only when severely constipated.  - Comeback and see your primary care provider in 2 weeks if you have not had any improvement in your symptoms.

## 2016-12-12 NOTE — Assessment & Plan Note (Signed)
Patient is here with signs and symptoms most consistent with constipation. No red flags symptoms at this time. Patient has been doing this for many years. Patient also stated that [name] son has a same issue. Patient's diet likely a strong contributor to this issue. - Continue MiraLAX 3 times a day - Initiate Metamucil twice a day - Glycerin suppository when necessary - Encouraged increase fluid intake - Encouraged increase fiber intake - Patient is to titrate Metamucil/MiraLAX with a target of 1-2 soft but not loose stools a day. - Return precautions discussed - Follow-up with PCP

## 2016-12-15 ENCOUNTER — Ambulatory Visit (HOSPITAL_COMMUNITY)
Admission: EM | Admit: 2016-12-15 | Discharge: 2016-12-15 | Disposition: A | Discharge status: Home / Self Care | Payer: Medicaid Other | Attending: Family Medicine | Admitting: Family Medicine

## 2016-12-15 ENCOUNTER — Encounter (HOSPITAL_COMMUNITY): Payer: Self-pay | Admitting: Emergency Medicine

## 2016-12-15 DIAGNOSIS — L0291 Cutaneous abscess, unspecified: Secondary | ICD-10-CM | POA: Diagnosis not present

## 2016-12-15 MED ORDER — LIDOCAINE-EPINEPHRINE (PF) 2 %-1:200000 IJ SOLN
INTRAMUSCULAR | Status: AC
  Filled 2016-12-15: qty 20

## 2016-12-15 MED ORDER — DOXYCYCLINE HYCLATE 100 MG PO TABS: 100.0000 mg | ORAL_TABLET | Freq: Two times a day (BID) | ORAL | 0 refills | Status: DC

## 2016-12-15 NOTE — Discharge Instructions (Signed)
Apply warm moist towels to the 2 abscesses that you have. Do this several times a day for the next 3 days

## 2016-12-15 NOTE — ED Triage Notes (Signed)
PT reports a "knot" behind left neck. Area is hard and painful to touch. PT reports an abscess on left side of mid back. This has been worsening for 1 week.

## 2016-12-15 NOTE — ED Provider Notes (Signed)
[facility]    CSN: [id number] Arrival date & time: 12/15/16  1214     History   Chief Complaint Chief Complaint  Patient presents with  . Abscess    HPI [name] is a 41 y.o. female.   PT reports a "knot" behind left neck. Area is hard and painful to touch. PT reports an abscess on left side of mid back. This has been worsening for 1 week.   Patient actually has 2 areas of tenderness on her back. One is between the scapulae on the upper left margin of the left scapulae. The other is at the base the neck on the left side. The one on the neck just started a day and a half ago.  The area on the scapula is about a week since onset and is more fluctuant and tender.      Past Medical History:  Diagnosis Date  . Hypertension   . Migraine   . Vertigo     Patient Active Problem List   Diagnosis Date Noted  . Otitis media 06/20/2016  . Bipolar 2 disorder (HCC) 03/25/2016  . Bilateral external ear infections 09/11/2014  . Essential hypertension, benign 09/27/2013  . GERD (gastroesophageal reflux disease) 05/02/2013  . Chronic lumbar pain 10/02/2012  . Migraine without aura 07/05/2012  . Constipation 06/24/2011  . Anxiety state 08/18/2010  . TOBACCO ABUSE 08/18/2010  . OBESITY NOS 01/26/2007  . ALLERGIC RHINITIS, SEASONAL 01/26/2007    History reviewed. No pertinent surgical history.  OB History    No data available       Home Medications    Prior to Admission medications   Medication Sig Start Date End Date Taking? Authorizing Provider  docusate sodium (COLACE) 100 MG capsule Take 1 capsule (100 mg total) by mouth 2 (two) times daily. 07/29/14   Leona Singleton, MD  doxycycline (VIBRA-TABS) 100 MG tablet Take 1 tablet (100 mg total) by mouth 2 (two) times daily. 12/15/16   Elvina Sidle, MD  EPINEPHrine (EPIPEN) 0.3 mg/0.3 mL IJ SOAJ injection Inject 0.3 mLs (0.3 mg total) into the muscle once. 02/20/14   Leona Singleton, MD    fluticasone (FLONASE) 50 MCG/ACT nasal spray Place 2 sprays into both nostrils daily. 12/08/14   Leona Singleton, MD  glycerin, Pediatric, 1.2 g SUPP Place 1 suppository (1.2 g total) rectally daily as needed for moderate constipation. 12/12/16   Kathee Delton, MD  hydrochlorothiazide (MICROZIDE) 12.5 MG capsule TAKE ONE CAPSULE BY MOUTH EVERY DAY 09/19/16   Asiyah Mayra Reel, MD  hydrOXYzine (ATARAX/VISTARIL) 10 MG tablet Take  nightly at 7pm if itching is severe. May cause drowsiness. 01/09/14   Leona Singleton, MD  ibuprofen (ADVIL,MOTRIN) 800 MG tablet Take 1 tablet (800 mg total) by mouth every 8 (eight) hours as needed. 07/30/16   Christopher Lawyer, PA-C  ipratropium (ATROVENT) 0.03 % nasal spray Place 2 sprays into the nose every 12 (twelve) hours. 03/29/13   Rodolph Bong, MD  loratadine (CLARITIN) 10 MG tablet Take 1 tablet (10 mg total) by mouth daily. Patient taking differently: Take 10 mg by mouth daily as needed for allergies.  02/12/14   Leona Singleton, MD  lurasidone (LATUDA) 40 MG TABS tablet Take 40 mg by mouth at bedtime.    Historical Provider, MD  meclizine (ANTIVERT) 12.5 MG tablet Take 1 tablet (12.5 mg total) by mouth 3 (three) times daily as needed for dizziness. 03/16/15   Nani Ravens, MD  montelukast (SINGULAIR) 10 MG tablet Take 1 tablet (10 mg total) by mouth at bedtime. 05/21/14   Reva Bores, MD  mupirocin ointment Idelle Jo) 2 % Apply three times daily to affected skin 01/01/16   Nani Ravens, MD  neomycin-polymyxin-hydrocortisone (CORTISPORIN) otic solution Place 3 drops into the left ear 3 (three) times daily. 06/09/16   Wendee Beavers, DO  ofloxacin (FLOXIN) 0.3 % otic solution Place 5 drops into the right ear daily. 07/30/16   Charlestine Night, PA-C  Olopatadine HCl 0.2 % SOLN Apply 1 drop to eye daily. Into each affected eye. Patient taking differently: Place 1 drop into both eyes daily as needed. For itching 02/24/14   Leona Singleton, MD   omeprazole (PRILOSEC) 20 MG capsule TAKE ONE CAPSULE BY MOUTH EVERY DAY 08/29/16   Asiyah Mayra Reel, MD  ondansetron (ZOFRAN) 4 MG tablet Take 1 tablet (4 mg total) by mouth every 8 (eight) hours as needed for nausea or vomiting. 03/30/16   Asiyah Mayra Reel, MD  polyethylene glycol (MIRALAX / GLYCOLAX) packet Take 17 g by mouth 3 (three) times daily. Until stooling regularly 12/12/16   Kathee Delton, MD  psyllium (METAMUCIL) 58.6 % packet Take 1 packet by mouth 2 (two) times daily. 12/12/16   Kathee Delton, MD  ranitidine (ZANTAC) 150 MG tablet TAKE 1 TABLET BY MOUTH EVERY DAY 11/27/15   Nani Ravens, MD  SUMAtriptan (IMITREX) 50 MG tablet TAKE 1 TABLET BY MOUTH FOR HEADACHE. MAY REPEAT IN 2 HOURS IF HEADACHE PERSIST 03/16/16   Bonney Aid, MD  tiZANidine (ZANAFLEX) 4 MG capsule Take 1 capsule (4 mg total) by mouth 3 (three) times daily. Do not take with flexeril 03/30/16   Asiyah Mayra Reel, MD  traMADol (ULTRAM) 50 MG tablet Take 1 tablet (50 mg total) by mouth every 6 (six) hours as needed for severe pain. 07/30/16   Charlestine Night, PA-C    Family History No family history on file.  Social History Social History  Substance Use Topics  . Smoking status: Current Every Day Smoker    Packs/day: 0.50    Years: 19.00    Types: Cigarettes  . Smokeless tobacco: Never Used  . Alcohol use No     Allergies   Aripiprazole; Escitalopram oxalate; and Latex   Review of Systems Review of Systems  All other systems reviewed and are negative.    Physical Exam Triage Vital Signs ED Triage Vitals  Enc Vitals Group     BP 12/15/16 1230 (!) 136/96     Pulse Rate 12/15/16 1230 92     Resp 12/15/16 1230 16     Temp 12/15/16 1230 99.1 F (37.3 C)     Temp Source 12/15/16 1230 Oral     SpO2 12/15/16 1230 100 %     Weight 12/15/16 1236 231 lb (104.8 kg)     Height --      Head Circumference --      Peak Flow --      Pain Score 12/15/16 1237 8     Pain Loc --      Pain Edu? --       Excl. in GC? --    No data found.   Updated Vital Signs BP (!) 136/96 (BP Location: Left Arm) Comment: notified rn  Pulse 92   Temp 99.1 F (37.3 C) (Oral)   Resp 16   Wt 231 lb (104.8 kg)   LMP 11/20/2016   SpO2 100%  BMI 34.11 kg/m    Physical Exam  Constitutional: She is oriented to person, place, and time. She appears well-developed and well-nourished.  HENT:  Right Ear: External ear normal.  Left Ear: External ear normal.  Mouth/Throat: Oropharynx is clear and moist.  Eyes: Conjunctivae and EOM are normal. Pupils are equal, round, and reactive to light.  Neck: Normal range of motion. Neck supple.  Pulmonary/Chest: Effort normal.  Musculoskeletal: Normal range of motion.  Neurological: She is alert and oriented to person, place, and time.  Nursing note and vitals reviewed.    UC Treatments / Results  Labs (all labs ordered are listed, but only abnormal results are displayed) Labs Reviewed - No data to display  EKG  EKG Interpretation None       Radiology No results found.  Procedures .Marland KitchenIncision and Drainage Date/Time: 12/15/2016 1:06 PM Performed by: Elvina Sidle Authorized by: Elvina Sidle   Consent:    Consent obtained:  Verbal   Consent given by:  Patient   Risks discussed:  Infection   Alternatives discussed:  No treatment Location:    Type:  Abscess   Location:  Trunk   Trunk location:  Back Pre-procedure details:    Skin preparation:  Betadine Anesthesia (see MAR for exact dosages):    Anesthesia method:  Local infiltration   Local anesthetic:  Lidocaine 1% WITH epi Procedure type:    Complexity:  Simple Procedure details:    Needle aspiration: no     Incision types:  Stab incision   Incision depth:  Subcutaneous   Scalpel blade:  11   Wound management:  Probed and deloculated   Drainage:  Bloody and purulent   Drainage amount:  Moderate   Wound treatment:  Wound left open   Packing materials:  None Post-procedure  details:    Patient tolerance of procedure:  Tolerated well, no immediate complications   (including critical care time)  Medications Ordered in UC Medications - No data to display   Initial Impression / Assessment and Plan / UC Course  I have reviewed the triage vital signs and the nursing notes.  Pertinent labs & imaging results that were available during my care of the patient were reviewed by me and considered in my medical decision making (see chart for details).     Final Clinical Impressions(s) / UC Diagnoses   Final diagnoses:  Abscess    New Prescriptions New Prescriptions   DOXYCYCLINE (VIBRA-TABS) 100 MG TABLET    Take 1 tablet (100 mg total) by mouth 2 (two) times daily.     Elvina Sidle, MD 12/15/16 1309

## 2017-01-11 ENCOUNTER — Telehealth: Payer: Self-pay | Admitting: *Deleted

## 2017-01-11 ENCOUNTER — Ambulatory Visit (INDEPENDENT_AMBULATORY_CARE_PROVIDER_SITE_OTHER): Payer: Medicaid Other | Admitting: Internal Medicine

## 2017-01-11 ENCOUNTER — Encounter: Payer: Self-pay | Admitting: Internal Medicine

## 2017-01-11 VITALS — BP 132/90 | HR 111 | Temp 99.5°F | Ht 69.0 in | Wt 230.0 lb

## 2017-01-11 DIAGNOSIS — M25552 Pain in left hip: Secondary | ICD-10-CM | POA: Diagnosis not present

## 2017-01-11 MED ORDER — DICLOFENAC SODIUM 75 MG PO TBEC: 75.0000 mg | DELAYED_RELEASE_TABLET | Freq: Two times a day (BID) | ORAL | 0 refills | Status: DC

## 2017-01-11 NOTE — Progress Notes (Signed)
   [facility] Phone: [phone]  Subjective:  [name] is a 41 year old female presenting to same day clinic with hip pain since yesterday. The hip pain is located in the "front of her hip" and "near her groin". She denies any trauma or injury. She states that she was running a lot of errands yesterday and was getting in and out of the car, but she is normally pretty active. Her hip pain is worse with climbing stairs. The pain feels "achy". She has been using Ibuprofen and Tylenol, which helps a little. The pain sometimes radiates into her left thigh. She has a history of chronic low back pain, but states that her back pain has not worsened recently. She denies any numbness, tingling, or weakness in her legs. She denies any fevers or chills.  ROS: See HPI for pertinent positives and negatives  Past Medical History- HTN, migraines, allergies, GERD, obesity, bipolar II disorder, chronic lumbar pain  Family history reviewed for today's visit. No changes.  Social history- patient is a current smoker  Objective: BP 132/90   Pulse (!) 111   Temp 99.5 F (37.5 C) (Oral)   Ht 5\' 9"  (1.753 m)   Wt 230 lb (104.3 kg)   LMP 12/12/2016 (Approximate)   SpO2 98%   BMI 33.97 kg/m  Gen: NAD, alert, cooperative with exam Left Hip: No edema, erythema, or joint deformity noted. No tenderness to palpation. Decreased ROM with flexion secondary to pain. No pain with FABER or FADIR tests. Pain with resisted abduction. No pain with resisted adduction  Neuro: Straight leg raise negative bilaterally. Distal legs are neurovascularly intact.  Assessment/Plan: Left Hip Pain: Think this is likely muscular in origin because she has pain with walking up stairs and pain with active ROM, but not passive ROM. No joint warmth or erythema to suggest septic joint. She does have decreased ROM secondary to pain so joint effusion is on the differential. Osteoarthritis less likely. - Will order  bilateral hip x-rays - Diclofenac prescribed - Follow-up if no improvement. May need to consider referral to sports med.   Willadean CarolKaty Kazi Montoro, MD PGY-2

## 2017-01-11 NOTE — Patient Instructions (Signed)
It was so nice to meet you!  I have prescribed some Diclofenac tablets. Please take this twice a day. You should also use heat and ice to the area.  I have ordered an x-ray. I will call you with these results.  If you are not getting better, we may need to talk about sending you to the sports medicine clinic.  -Dr. Nancy MarusMayo

## 2017-01-11 NOTE — Assessment & Plan Note (Signed)
Think this is likely muscular in origin because she has pain with walking up stairs and pain with active ROM, but not passive ROM. No joint warmth or erythema to suggest septic joint. She does have decreased ROM secondary to pain so joint effusion is on the differential. Osteoarthritis less likely. - Will order bilateral hip x-rays - Diclofenac prescribed - Follow-up if no improvement. May need to consider referral to sports med.

## 2017-01-11 NOTE — Telephone Encounter (Signed)
Prior Authorization received from [facility] for Diclofenac 75 mg tablet. PA was approved via [employer] until 01/11/18. Approval number: [id number].  Clovis PuMartin, Xadrian Craighead L, RN

## 2017-02-02 ENCOUNTER — Ambulatory Visit: Payer: Medicaid Other | Admitting: Family Medicine

## 2017-02-16 ENCOUNTER — Telehealth: Payer: Self-pay | Admitting: Family Medicine

## 2017-02-16 NOTE — Telephone Encounter (Signed)
**  After Hours/ Emergency Line Call*  Received a call to report that [name] has vaginal itching and odor. She denies any vaginal discharge or fever.  Recommended that she can try over the counter Monostat cream for possible yeast infection. Offered to make an appt for a same day visit but the clinic is full tomorrow. Asked her to please go to urgent care tomorrow or come into the clinic on Monday if symptoms persist.  Red flags discussed.  Will forward to PCP.  Beaulah Dinninghristina M Gambino, MD PGY-2, [facility]

## 2017-04-26 ENCOUNTER — Telehealth: Payer: Self-pay | Admitting: Internal Medicine

## 2017-04-26 NOTE — Telephone Encounter (Signed)
Pt has pain in her right side and has had it for 3 days. She thought it was a muscle strain but it hasnt gone away.  She wanted an appt to see the dr tomorrow but nothing was available.  She would like to talk to a nurse to see what she can do to relieve the pain

## 2017-04-28 NOTE — Telephone Encounter (Signed)
Called patient, no answer, no voicemail

## 2017-05-13 ENCOUNTER — Telehealth: Payer: Self-pay | Admitting: Internal Medicine

## 2017-05-13 ENCOUNTER — Ambulatory Visit (HOSPITAL_COMMUNITY)
Admission: EM | Admit: 2017-05-13 | Discharge: 2017-05-13 | Disposition: A | Discharge status: Home / Self Care | Payer: Medicaid Other | Attending: Family | Admitting: Family

## 2017-05-13 ENCOUNTER — Encounter (HOSPITAL_COMMUNITY): Payer: Self-pay | Admitting: Family Medicine

## 2017-05-13 DIAGNOSIS — R51 Headache: Secondary | ICD-10-CM

## 2017-05-13 DIAGNOSIS — R519 Headache, unspecified: Secondary | ICD-10-CM

## 2017-05-13 LAB — POCT I-STAT, CHEM 8
BUN: 12 mg/dL (ref 6–20)
CHLORIDE: 104 mmol/L (ref 101–111)
CREATININE: 0.7 mg/dL (ref 0.44–1.00)
Calcium, Ion: 1.21 mmol/L (ref 1.15–1.40)
GLUCOSE: 94 mg/dL (ref 65–99)
HCT: 41 % (ref 36.0–46.0)
Hemoglobin: 13.9 g/dL (ref 12.0–15.0)
POTASSIUM: 4.2 mmol/L (ref 3.5–5.1)
Sodium: 139 mmol/L (ref 135–145)
TCO2: 25 mmol/L (ref 22–32)

## 2017-05-13 MED ORDER — KETOROLAC TROMETHAMINE 60 MG/2ML IM SOLN
60.0000 mg | Freq: Once | INTRAMUSCULAR | Status: AC
  Administered 2017-05-13: 60 mg via INTRAMUSCULAR

## 2017-05-13 MED ORDER — KETOROLAC TROMETHAMINE 60 MG/2ML IM SOLN
INTRAMUSCULAR | Status: AC
  Filled 2017-05-13: qty 2

## 2017-05-13 NOTE — ED Triage Notes (Signed)
Pt here for migraine. sts she took Imitrex without relief.

## 2017-05-13 NOTE — ED Provider Notes (Signed)
[facility]    CSN: [id number] Arrival date & time: 05/13/17  1704     History   Chief Complaint Chief Complaint  Patient presents with  . Migraine    HPI [name] is a 41 y.o. female.   Chief complaint of HAx 3 days ago, worsening. ' I have had worse Headaches.'   Throbbing 'all over head and neck.' Feels like vision is 'hazy', no double vision, states normal with her HAs. Knows when gets tense in neck, will get migraines. Endorses photosensitivity, nausea. History of migraines and usually takes Imitrex. However today Imitrex did not help.   NO dizziness, vomiting, fever, sinus pain.    history of smoking.  Took 2 doses of imitrex for 2 days and no relief.   Tried bengay prior to HA as thought neck tension was triggering HA.   Tried motrin with no relief.   Has never been to eye doctor.   HTN- hadn't taken BP medication today ( HCTZ). Had been taking medication prior to today, so doesn't think HA is from blood pressure.  Denies exertional chest pain or pressure, numbness or tingling radiating to left arm or jaw, palpitations, dizziness, frequent headaches, changes in vision, or shortness of breath.      NO CKD    Past Medical History:  Diagnosis Date  . Hypertension   . Migraine   . Vertigo     Patient Active Problem List   Diagnosis Date Noted  . Left hip pain 01/11/2017  . Otitis media 06/20/2016  . Bipolar 2 disorder (HCC) 03/25/2016  . Bilateral external ear infections 09/11/2014  . Essential hypertension, benign 09/27/2013  . GERD (gastroesophageal reflux disease) 05/02/2013  . Chronic lumbar pain 10/02/2012  . Migraine without aura 07/05/2012  . Constipation 06/24/2011  . Anxiety state 08/18/2010  . TOBACCO ABUSE 08/18/2010  . OBESITY NOS 01/26/2007  . ALLERGIC RHINITIS, SEASONAL 01/26/2007    History reviewed. No pertinent surgical history.  OB History    No data available       Home Medications    Prior to  Admission medications   Medication Sig Start Date End Date Taking? Authorizing Provider  diclofenac (VOLTAREN) 75 MG EC tablet Take 1 tablet (75 mg total) by mouth 2 (two) times daily. 01/11/17   Mayo, Allyn Kenner, MD  docusate sodium (COLACE) 100 MG capsule Take 1 capsule (100 mg total) by mouth 2 (two) times daily. 07/29/14   Leona Singleton, MD  doxycycline (VIBRA-TABS) 100 MG tablet Take 1 tablet (100 mg total) by mouth 2 (two) times daily. 12/15/16   Elvina Sidle, MD  EPINEPHrine (EPIPEN) 0.3 mg/0.3 mL IJ SOAJ injection Inject 0.3 mLs (0.3 mg total) into the muscle once. 02/20/14   Leona Singleton, MD  fluticasone (FLONASE) 50 MCG/ACT nasal spray Place 2 sprays into both nostrils daily. 12/08/14   Leona Singleton, MD  glycerin, Pediatric, 1.2 g SUPP Place 1 suppository (1.2 g total) rectally daily as needed for moderate constipation. 12/12/16   McKeag, Janine Ores, MD  hydrochlorothiazide (MICROZIDE) 12.5 MG capsule TAKE ONE CAPSULE BY MOUTH EVERY DAY 09/19/16   Mikell, Antionette Poles, MD  hydrOXYzine (ATARAX/VISTARIL) 10 MG tablet Take  nightly at 7pm if itching is severe. May cause drowsiness. 01/09/14   Leona Singleton, MD  ipratropium (ATROVENT) 0.03 % nasal spray Place 2 sprays into the nose every 12 (twelve) hours. 03/29/13   Rodolph Bong, MD  loratadine (CLARITIN) 10 MG tablet Take 1  tablet (10 mg total) by mouth daily. Patient taking differently: Take 10 mg by mouth daily as needed for allergies.  02/12/14   Leona Singleton, MD  lurasidone (LATUDA) 40 MG TABS tablet Take 40 mg by mouth at bedtime.    [provider]  meclizine (ANTIVERT) 12.5 MG tablet Take 1 tablet (12.5 mg total) by mouth 3 (three) times daily as needed for dizziness. 03/16/15   Nani Ravens, MD  montelukast (SINGULAIR) 10 MG tablet Take 1 tablet (10 mg total) by mouth at bedtime. 05/21/14   Reva Bores, MD  mupirocin ointment Idelle Jo) 2 % Apply three times daily to affected skin  01/01/16   Nani Ravens, MD  neomycin-polymyxin-hydrocortisone (CORTISPORIN) otic solution Place 3 drops into the left ear 3 (three) times daily. 06/09/16   Wendee Beavers, DO  ofloxacin (FLOXIN) 0.3 % otic solution Place 5 drops into the right ear daily. 07/30/16   Lawyer, Cristal Deer, PA-C  Olopatadine HCl 0.2 % SOLN Apply 1 drop to eye daily. Into each affected eye. Patient taking differently: Place 1 drop into both eyes daily as needed. For itching 02/24/14   Leona Singleton, MD  omeprazole (PRILOSEC) 20 MG capsule TAKE ONE CAPSULE BY MOUTH EVERY DAY 08/29/16   Mikell, Antionette Poles, MD  ondansetron (ZOFRAN) 4 MG tablet Take 1 tablet (4 mg total) by mouth every 8 (eight) hours as needed for nausea or vomiting. 03/30/16   Mikell, Antionette Poles, MD  polyethylene glycol (MIRALAX / GLYCOLAX) packet Take 17 g by mouth 3 (three) times daily. Until stooling regularly 12/12/16   McKeag, Janine Ores, MD  psyllium (METAMUCIL) 58.6 % packet Take 1 packet by mouth 2 (two) times daily. 12/12/16   McKeag, Janine Ores, MD  ranitidine (ZANTAC) 150 MG tablet TAKE 1 TABLET BY MOUTH EVERY DAY 11/27/15   Nani Ravens, MD  SUMAtriptan (IMITREX) 50 MG tablet TAKE 1 TABLET BY MOUTH FOR HEADACHE. MAY REPEAT IN 2 HOURS IF HEADACHE PERSIST 03/16/16   Haney, Alyssa A, MD  tiZANidine (ZANAFLEX) 4 MG capsule Take 1 capsule (4 mg total) by mouth 3 (three) times daily. Do not take with flexeril 03/30/16   Berton Bon, MD  traMADol (ULTRAM) 50 MG tablet Take 1 tablet (50 mg total) by mouth every 6 (six) hours as needed for severe pain. 07/30/16   Charlestine Night, PA-C    Family History History reviewed. No pertinent family history.  Social History Social History  Substance Use Topics  . Smoking status: Current Every Day Smoker    Packs/day: 0.50    Years: 19.00    Types: Cigarettes  . Smokeless tobacco: Never Used  . Alcohol use No     Allergies   Aripiprazole; Escitalopram oxalate; and Latex   Review of  Systems Review of Systems  Constitutional: Negative for chills and fever.  Eyes: Positive for visual disturbance ('hazy').  Respiratory: Negative for cough.   Cardiovascular: Negative for chest pain and palpitations.  Gastrointestinal: Negative for nausea and vomiting.  Neurological: Positive for headaches. Negative for dizziness.     Physical Exam Triage Vital Signs ED Triage Vitals  Enc Vitals Group     BP 05/13/17 1751 (!) 155/108     Pulse Rate 05/13/17 1751 83     Resp 05/13/17 1751 18     Temp --      Temp src --      SpO2 05/13/17 1751 100 %     Weight --  Height --      Head Circumference --      Peak Flow --      Pain Score 05/13/17 1750 9     Pain Loc --      Pain Edu? --      Excl. in GC? --    No data found.   Updated Vital Signs BP (!) 155/108   Pulse 83   Resp 18   SpO2 100%  Recheck 138/94.  Visual Acuity Right Eye Distance:   Left Eye Distance:   Bilateral Distance:    Right Eye Near:   Left Eye Near:    Bilateral Near:     Physical Exam  Constitutional: She appears well-developed and well-nourished.  HENT:  Mouth/Throat: Uvula is midline, oropharynx is clear and moist and mucous membranes are normal.  Eyes: Pupils are equal, round, and reactive to light. Conjunctivae and EOM are normal.  Fundus normal bilaterally.   Neck: Spinous process tenderness and muscular tenderness present. No neck rigidity. No edema, no erythema and normal range of motion present.    Tenderness as noted per diagram.  Cardiovascular: Normal rate, regular rhythm, normal heart sounds and normal pulses.   Pulmonary/Chest: Effort normal and breath sounds normal. She has no wheezes. She has no rhonchi. She has no rales.  Neurological: She is alert. She has normal strength. No cranial nerve deficit or sensory deficit. She displays a negative Romberg sign.  Reflex Scores:      Bicep reflexes are 2+ on the right side and 2+ on the left side.      Patellar reflexes  are 2+ on the right side and 2+ on the left side. Grip equal and strong bilateral upper extremities. Gait strong and steady. Able to perform rapid alternating movement without difficulty.   Skin: Skin is warm and dry.  Psychiatric: She has a normal mood and affect. Her speech is normal and behavior is normal. Thought content normal.  Vitals reviewed.    UC Treatments / Results  Labs (all labs ordered are listed, but only abnormal results are displayed) Labs Reviewed - No data to display  EKG  EKG Interpretation None       Radiology No results found.  Procedures Procedures (including critical care time)  Medications Ordered in UC Medications - No data to display   Initial Impression / Assessment and Plan / UC Course  I have reviewed the triage vital signs and the nursing notes.  Pertinent labs & imaging results that were available during my care of the patient were reviewed by me and considered in my medical decision making (see chart for details).       Final Clinical Impressions(s) / UC Diagnoses   Final diagnoses:  Acute intractable headache, unspecified headache type   Reassured by normal neurologic exam. Blood pressure came down while patient waited to be evaluated in exam room. She declines going to the ED tonight for CT and states that her vision being 'hazy' is typical for her headaches. She assures me this is not new or changed from prior. Reassured his headache felt similar to headaches in the past. Crt 0.7. Toradol injection given. Advised her to see an eye doctor for eye exam and to ensure she takes HCTZ when she gets home. Patient verbalized understanding of this. Return precautions given. New Prescriptions New Prescriptions   No medications on file     Controlled Substance Prescriptions [place] Controlled Substance Registry consulted? Not Applicable   Allegra Grana,  FNP 05/13/17 1914

## 2017-05-13 NOTE — Telephone Encounter (Signed)
**  After Hours/ Emergency Line Call*  Received a call to report that [name]. - migraine for 3 days  - HA with blurred vision which are her usual symptoms  - has been taking Imitrex but still has. Has been taking motrin and tylenol. Helps a little.  - has slight nausea.  - no slurred speech, facial droop  - asks if it is okay to go to urgent care for this - I agreed that if home medications are not helping she should go to urgent care since clinic is not open today. Patient agrees.    Palma Holter, MD PGY-3, [facility]

## 2017-05-13 NOTE — Discharge Instructions (Signed)
Get plenty of rest tonight and hope your headache has stopped after toradol injection.  As discussed, please also take your blood pressure medication when you get home. I would also strongly advise an eye appointment to have your eyes checked with an eye doctor.  If there is no improvement in your symptoms, or if there is any worsening of symptoms, or if you have any additional concerns, please return for re-evaluation; or, if we are closed, consider going to the Emergency Room for evaluation if symptoms urgent.

## 2017-05-29 ENCOUNTER — Encounter: Payer: Self-pay | Admitting: Internal Medicine

## 2017-05-29 ENCOUNTER — Ambulatory Visit (INDEPENDENT_AMBULATORY_CARE_PROVIDER_SITE_OTHER): Payer: Medicaid Other | Admitting: Internal Medicine

## 2017-05-29 ENCOUNTER — Other Ambulatory Visit (HOSPITAL_COMMUNITY)
Admission: RE | Admit: 2017-05-29 | Discharge: 2017-05-29 | Disposition: A | Discharge status: Home / Self Care | Payer: Medicaid Other | Source: Ambulatory Visit | Attending: Family Medicine | Admitting: Family Medicine

## 2017-05-29 VITALS — BP 128/90 | HR 67 | Temp 98.8°F | Ht 69.0 in | Wt 227.0 lb

## 2017-05-29 DIAGNOSIS — Z124 Encounter for screening for malignant neoplasm of cervix: Secondary | ICD-10-CM

## 2017-05-29 DIAGNOSIS — I10 Essential (primary) hypertension: Secondary | ICD-10-CM

## 2017-05-29 DIAGNOSIS — F3181 Bipolar II disorder: Secondary | ICD-10-CM

## 2017-05-29 DIAGNOSIS — R0683 Snoring: Secondary | ICD-10-CM | POA: Diagnosis not present

## 2017-05-29 NOTE — Progress Notes (Signed)
   [name] [name], MD Phone: [phone]  Reason For Visit: Pap smear/HTN   #Pap smears - Patient has had yeast infections recently  - Notes some slight pain with sexual activity  - No vaginal discharge  - Hx of STDs - in younger days - Sexual active: 1 month ago - 1 partner has been with him for 20 years  - Has had normal pap smear over the past few years  - Pap smear in 2015 - normal, no HPV  - Does not do breast exams regular; had a mammogram at early age  - Not currently on any contraception; does not use condoms - has not been birth control  - Had a molar pregnancy about 20 years   # CHRONIC HTN: Current Meds - HCTZ 12.5 mg  Reports good compliance, took meds today. Tolerating well, w/o complaints. Lifestyle - has been taking  Denies CP, dyspnea, HA, edema, dizziness / lightheadedness  # Bipolar Disorder  - going to see [facility]  - receives sleep medication and bipolar medication  - Patient also having symptoms of sleep apnea- indicates that she snores heavily, and sometimes is told that she stops breathing during her sleep  Past Medical History Reviewed problem list.  Medications- reviewed and updated No additions to family history Social history- patient is a smoker  Objective: BP 128/90   Pulse 67   Temp 98.8 F (37.1 C) (Oral)   Ht  (1.753 m)   Wt 227 lb (103 kg)   LMP 05/01/2017   SpO2 98%   BMI 33.52 kg/m  Gen: NAD, alert, cooperative with exam Cardio: regular rate and rhythm, S1S2 heard, no murmurs appreciated Pulm: clear to auscultation bilaterally, no wheezes, rhonchi or rales GI: soft, non-tender, non-distended, bowel sounds present, no hepatomegaly, no splenomegaly GU: external vaginal tissue wnl, cervix with small cervical polyp protruding from cervix, no discharge from cervical os, no bleeding, slight discomfort on pressing on the cervix,no abdominal/ adnexal masses  Assessment/Plan: See problem based  a/p  Snoring Patient with significant snoring and states she has been told that she suffered while she sleeps concerning for sleep apnea - Split night study; Future   Bipolar 2 disorder (HCC) Following [facility], has not seen them for several months  Essential hypertension, benign Blood pressure overall well controlled continue hydrochlorothiazide Follow-up in 2 months  Screening for malignant neoplasm of cervix Patient here for Pap smear last past in 2015 was normal No need for early mammogram screening  Pap smear was significant for cervical polyp; therefore will have patient follow-up with GYN clinic for possible removal as has associated pain with sexual activity from polyp - Cytology - [facility] [place]- will check for STDs

## 2017-05-29 NOTE — Patient Instructions (Addendum)
I want you to follow up with the sleep clinic to be evaluated for sleep apnea. I want you to follow up with colposcopy clinic to be evaluated for your cervical biopsy polyp. These follow-up with me in the next 2 months to see how things are doing.

## 2017-05-30 DIAGNOSIS — Z124 Encounter for screening for malignant neoplasm of cervix: Secondary | ICD-10-CM | POA: Insufficient documentation

## 2017-05-30 NOTE — Assessment & Plan Note (Signed)
Patient here for Pap smear last past in 2015 was normal No need for early mammogram screening  Pap smear was significant for cervical polyp; therefore will have patient follow-up with GYN clinic for possible removal as has associated pain with sexual activity from polyp - Cytology - PAP [place]- will check for STDs

## 2017-05-30 NOTE — Assessment & Plan Note (Signed)
Following [facility], has not seen them for several months

## 2017-05-30 NOTE — Assessment & Plan Note (Signed)
Blood pressure overall well controlled continue hydrochlorothiazide Follow-up in 2 months

## 2017-05-30 NOTE — Assessment & Plan Note (Signed)
Patient with significant snoring and states she has been told that she suffered while she sleeps concerning for sleep apnea - Split night study; Future

## 2017-05-31 LAB — CYTOLOGY - PAP
Chlamydia: NEGATIVE
DIAGNOSIS: NEGATIVE
HPV: NOT DETECTED
Neisseria Gonorrhea: NEGATIVE

## 2017-06-09 ENCOUNTER — Telehealth: Payer: Self-pay | Admitting: Internal Medicine

## 2017-06-09 ENCOUNTER — Telehealth: Payer: Self-pay | Admitting: *Deleted

## 2017-06-12 NOTE — Telephone Encounter (Signed)
Patient calling with complaints of intermittent, left sided abdominal pain. Briefly discussed with patient, indicated that she would need to come in for office visit for further evaluation

## 2017-06-22 ENCOUNTER — Ambulatory Visit: Payer: Medicaid Other

## 2017-07-03 ENCOUNTER — Encounter (HOSPITAL_BASED_OUTPATIENT_CLINIC_OR_DEPARTMENT_OTHER): Payer: Medicaid Other

## 2017-07-17 ENCOUNTER — Other Ambulatory Visit: Payer: Self-pay | Admitting: *Deleted

## 2017-07-17 ENCOUNTER — Encounter: Payer: Self-pay | Admitting: Internal Medicine

## 2017-07-18 ENCOUNTER — Telehealth: Payer: Self-pay | Admitting: Internal Medicine

## 2017-07-18 ENCOUNTER — Encounter: Payer: Self-pay | Admitting: Internal Medicine

## 2017-07-18 MED ORDER — HYDROCHLOROTHIAZIDE 12.5 MG PO CAPS: 12.5000 mg | ORAL_CAPSULE | Freq: Every day | ORAL | 2 refills | Status: DC

## 2017-07-18 NOTE — Telephone Encounter (Signed)
Pt called needing a refill on hydrochlorothiazide for her bp. She has been out for a week now and hasnt been able to take it. Her pharmacy has tried to get in touch with dr and can't get a response from her. Pharmacy keeps contacting pt to see why her med isnt being refilled. Pt has never had this issue before and doesn't know whats going on. [facility] [address]. Please advise

## 2017-07-19 ENCOUNTER — Telehealth: Payer: Self-pay | Admitting: Internal Medicine

## 2017-07-19 MED ORDER — HYDROCHLOROTHIAZIDE 12.5 MG PO CAPS: 12.5000 mg | ORAL_CAPSULE | Freq: Every day | ORAL | 3 refills | Status: DC

## 2017-07-19 MED ORDER — OMEPRAZOLE 20 MG PO CPDR: 20.0000 mg | DELAYED_RELEASE_CAPSULE | Freq: Every day | ORAL | 2 refills | Status: DC

## 2017-07-19 NOTE — Telephone Encounter (Signed)
Refilled meds

## 2017-07-20 ENCOUNTER — Ambulatory Visit: Payer: Medicaid Other

## 2017-08-03 ENCOUNTER — Ambulatory Visit: Payer: Medicaid Other

## 2017-08-06 ENCOUNTER — Encounter (HOSPITAL_COMMUNITY): Payer: Self-pay

## 2017-08-06 ENCOUNTER — Emergency Department (HOSPITAL_COMMUNITY)
Admission: EM | Admit: 2017-08-06 | Discharge: 2017-08-06 | Disposition: A | Discharge status: Home / Self Care | Payer: Medicaid Other | Attending: Emergency Medicine | Admitting: Emergency Medicine

## 2017-08-06 ENCOUNTER — Other Ambulatory Visit: Payer: Self-pay

## 2017-08-06 DIAGNOSIS — Z91040 Latex allergy status: Secondary | ICD-10-CM | POA: Insufficient documentation

## 2017-08-06 DIAGNOSIS — R0981 Nasal congestion: Secondary | ICD-10-CM | POA: Diagnosis not present

## 2017-08-06 DIAGNOSIS — I10 Essential (primary) hypertension: Secondary | ICD-10-CM | POA: Diagnosis not present

## 2017-08-06 DIAGNOSIS — J3489 Other specified disorders of nose and nasal sinuses: Secondary | ICD-10-CM | POA: Insufficient documentation

## 2017-08-06 DIAGNOSIS — H9213 Otorrhea, bilateral: Secondary | ICD-10-CM | POA: Diagnosis present

## 2017-08-06 DIAGNOSIS — H60503 Unspecified acute noninfective otitis externa, bilateral: Secondary | ICD-10-CM | POA: Diagnosis not present

## 2017-08-06 DIAGNOSIS — F17210 Nicotine dependence, cigarettes, uncomplicated: Secondary | ICD-10-CM | POA: Insufficient documentation

## 2017-08-06 DIAGNOSIS — Z79899 Other long term (current) drug therapy: Secondary | ICD-10-CM | POA: Diagnosis not present

## 2017-08-06 MED ORDER — CIPROFLOXACIN-DEXAMETHASONE 0.3-0.1 % OT SUSP: 4.0000 [drp] | Freq: Two times a day (BID) | OTIC | 0 refills | Status: AC

## 2017-08-06 NOTE — ED Triage Notes (Signed)
Patient complains of ear drainage bilateral with frontal headache and sore throat. Patient states that it has muffled her hearing. States that the pain is not as severe. Alert and oriented

## 2017-08-06 NOTE — ED Provider Notes (Signed)
[facility] Provider Note   CSN: [id number] Arrival date & time: 08/06/17  1011     History   Chief Complaint Chief Complaint  Patient presents with  . ear drainage/congestion    HPI [name] is a 41 y.o. female.  HPI  Patient is a 41 y.o. female with a history of hypertension, migraine, and vertigo, and allergic rhinitis presenting for bilateral ear drainage. Patient reports she has a chronic problem with outer ear infections for many years. Patient reports that the right ear canal has produced significantly more drainage in the past week. Patient reports drainage as white and without blood. No sudden hearing loss. Patient reports that the past prescriptions she had for this have caused her ear canals to be pruritic. Patient reports she is currently treating her congestion, ear fullness and rhinorrhea Flonase. Patient is not diabetic. No immunosuppressing conditions or medications.  Past Medical History:  Diagnosis Date  . Hypertension   . Migraine   . Vertigo     Patient Active Problem List   Diagnosis Date Noted  . Screening for malignant neoplasm of cervix 05/30/2017  . Snoring 05/29/2017  . Left hip pain 01/11/2017  . Otitis media 06/20/2016  . Bipolar 2 disorder (HCC) 03/25/2016  . Bilateral external ear infections 09/11/2014  . Essential hypertension, benign 09/27/2013  . GERD (gastroesophageal reflux disease) 05/02/2013  . Chronic lumbar pain 10/02/2012  . Migraine without aura 07/05/2012  . Constipation 06/24/2011  . Anxiety state 08/18/2010  . TOBACCO ABUSE 08/18/2010  . OBESITY NOS 01/26/2007  . ALLERGIC RHINITIS, SEASONAL 01/26/2007    History reviewed. No pertinent surgical history.  OB History    No data available       Home Medications    Prior to Admission medications   Medication Sig Start Date End Date Taking? Authorizing Provider  ciprofloxacin-dexamethasone (CIPRODEX) OTIC suspension  Place 4 drops into both ears 2 (two) times daily for 7 days. 08/06/17 08/13/17  Aviva KluverMurray, Ndia Sampath B, PA-C  diclofenac (VOLTAREN) 75 MG EC tablet Take 1 tablet (75 mg total) by mouth 2 (two) times daily. 01/11/17   Mayo, Allyn KennerKaty Dodd, MD  docusate sodium (COLACE) 100 MG capsule Take 1 capsule (100 mg total) by mouth 2 (two) times daily. 07/29/14   Leona Singletonhekkekandam, Maria T, MD  doxycycline (VIBRA-TABS) 100 MG tablet Take 1 tablet (100 mg total) by mouth 2 (two) times daily. 12/15/16   Elvina SidleLauenstein, Kurt, MD  EPINEPHrine (EPIPEN) 0.3 mg/0.3 mL IJ SOAJ injection Inject 0.3 mLs (0.3 mg total) into the muscle once. 02/20/14   Leona Singletonhekkekandam, Maria T, MD  fluticasone (FLONASE) 50 MCG/ACT nasal spray Place 2 sprays into both nostrils daily. 12/08/14   Leona Singletonhekkekandam, Maria T, MD  glycerin, Pediatric, 1.2 g SUPP Place 1 suppository (1.2 g total) rectally daily as needed for moderate constipation. 12/12/16   McKeag, Janine OresIan D, MD  hydrochlorothiazide (MICROZIDE) 12.5 MG capsule Take 1 capsule (12.5 mg total) by mouth daily. 07/19/17   Mikell, Antionette PolesAsiyah Zahra, MD  ipratropium (ATROVENT) 0.03 % nasal spray Place 2 sprays into the nose every 12 (twelve) hours. 03/29/13   Rodolph Bongorey, Evan S, MD  lurasidone (LATUDA) 40 MG TABS tablet Take 40 mg by mouth at bedtime.    [provider]  meclizine (ANTIVERT) 12.5 MG tablet Take 1 tablet (12.5 mg total) by mouth 3 (three) times daily as needed for dizziness. 03/16/15   Nani RavensWight, Andrew M, MD  montelukast (SINGULAIR) 10 MG tablet Take 1 tablet (10  mg total) by mouth at bedtime. 05/21/14   Reva Bores, MD  mupirocin ointment Idelle Jo) 2 % Apply three times daily to affected skin 01/01/16   Nani Ravens, MD  neomycin-polymyxin-hydrocortisone (CORTISPORIN) otic solution Place 3 drops into the left ear 3 (three) times daily. 06/09/16   Wendee Beavers, DO  ofloxacin (FLOXIN) 0.3 % otic solution Place 5 drops into the right ear daily. 07/30/16   Lawyer, Cristal Deer, PA-C  omeprazole (PRILOSEC) 20 MG  capsule Take 1 capsule (20 mg total) by mouth daily. 07/19/17   Mikell, Antionette Poles, MD  ondansetron (ZOFRAN) 4 MG tablet Take 1 tablet (4 mg total) by mouth every 8 (eight) hours as needed for nausea or vomiting. 03/30/16   Mikell, Antionette Poles, MD  polyethylene glycol (MIRALAX / GLYCOLAX) packet Take 17 g by mouth 3 (three) times daily. Until stooling regularly 12/12/16   McKeag, Janine Ores, MD  psyllium (METAMUCIL) 58.6 % packet Take 1 packet by mouth 2 (two) times daily. 12/12/16   McKeag, Janine Ores, MD  ranitidine (ZANTAC) 150 MG tablet TAKE 1 TABLET BY MOUTH EVERY DAY 11/27/15   Nani Ravens, MD  SUMAtriptan (IMITREX) 50 MG tablet TAKE 1 TABLET BY MOUTH FOR HEADACHE. MAY REPEAT IN 2 HOURS IF HEADACHE PERSIST 03/16/16   Haney, Jatniel Verastegui A, MD  tiZANidine (ZANAFLEX) 4 MG capsule Take 1 capsule (4 mg total) by mouth 3 (three) times daily. Do not take with flexeril 03/30/16   Berton Bon, MD    Family History No family history on file.  Social History Social History   Tobacco Use  . Smoking status: Current Every Day Smoker    Packs/day: 0.50    Years: 19.00    Pack years: 9.50    Types: Cigarettes  . Smokeless tobacco: Never Used  Substance Use Topics  . Alcohol use: No  . Drug use: No     Allergies   Aripiprazole; Escitalopram oxalate; and Latex   Review of Systems Review of Systems  Constitutional: Negative for chills and fever.  HENT: Positive for congestion, ear discharge, ear pain, postnasal drip and rhinorrhea. Negative for sore throat.   Eyes: Negative for discharge.  Respiratory: Negative for shortness of breath.      Physical Exam Updated Vital Signs BP (!) 158/108 (BP Location: Left Arm)   Pulse 80   Temp 99 F (37.2 C) (Oral)   Resp 18   SpO2 97%   Physical Exam  Constitutional: She appears well-developed and well-nourished. No distress.  Sitting comfortably in bed.  HENT:  Head: Normocephalic and atraumatic.  Mouth/Throat: Oropharynx is clear and moist. No  oropharyngeal exudate.  No tenderness to palpation of frontal and maxillary sinuses. No purulent rhinorrhea.  No mastoid tenderness, tragus tenderness, or pinna tenderness of bilateral ears. Right ear exhibits edema and erythema of external auditory canal. Thick white discharge present in right EAC. Possible TM perforation but full view obscured by edema.  Left ear exhibits discharge of of EAC.  Eyes: Conjunctivae are normal. Right eye exhibits no discharge. Left eye exhibits no discharge.  EOMs normal to gross examination.  Neck: Normal range of motion.  Cardiovascular: Normal rate and regular rhythm.  Intact, 2+ radial pulse.  Pulmonary/Chest:  Normal respiratory effort. Patient converses comfortably. No audible wheeze or stridor.  Abdominal: She exhibits no distension.  Musculoskeletal: Normal range of motion.  Neurological: She is alert.  Cranial nerves intact to gross observation. Patient moves extremities without difficulty.  Skin: Skin is  warm and dry. She is not diaphoretic.  Psychiatric: She has a normal mood and affect. Her behavior is normal. Judgment and thought content normal.  Nursing note and vitals reviewed.    ED Treatments / Results  Labs (all labs ordered are listed, but only abnormal results are displayed) Labs Reviewed - No data to display  EKG  EKG Interpretation None       Radiology No results found.  Procedures Procedures (including critical care time)  Medications Ordered in ED Medications - No data to display   Initial Impression / Assessment and Plan / ED Course  I have reviewed the triage vital signs and the nursing notes.  Pertinent labs & imaging results that were available during my care of the patient were reviewed by me and considered in my medical decision making (see chart for details).     Final Clinical Impressions(s) / ED Diagnoses   Final diagnoses:  Acute otitis externa of both ears, unspecified type   Patient is nontoxic  appearing and in no acute distress. No evidence of mastoiditis or malignant OE. Patient with likely acute on chronic otitis externa. Patient reports that she believes she has been told she has a right TM perforation in the past. Will avoid otic aminoglycosides. Will Rx for Ciprofloxacin-dexamethasone, which is on Medicaid preferred Rx list. I discussed with patient that she will require a follow up with ENT and provided referral. Patient to follow up with PCP for recheck this week. Return precautions for any fever with symptoms, increasing erythema of internal or external ear. All questions answered by patient.   ED Discharge Orders        Ordered    ciprofloxacin-dexamethasone (CIPRODEX) OTIC suspension  2 times daily     08/06/17 1129       Delia ChimesMurray, Geovani Tootle B, PA-C 08/07/17 1220    Gerhard MunchLockwood, Robert, MD 08/08/17 1626

## 2017-08-06 NOTE — Discharge Instructions (Signed)
Please see the information and instructions below regarding your visit.  Your diagnoses today include:  1. Acute otitis externa of both ears, unspecified type    There appears to be an infection in the canal of your ears.  Please avoid using Q-tips.  Tests performed today include: See side panel of your discharge paperwork for testing performed today. Vital signs are listed at the bottom of these instructions.   Your blood pressure is elevated today.  Please check this with your primary care provider.  Medications prescribed:    Take any prescribed medications only as prescribed, and any over the counter medications only as directed on the packaging.  Ciprofloxacin-dexamethasone drops.  Please apply these 4 drops twice a day for 7 days.  Within 7 days I would like you to have a follow-up with your primary care provider.  Home care instructions:  Please follow any educational materials contained in this packet.   Please avoid underwater submersion.   Follow-up instructions: Please follow-up with your primary care provider in in one week.  Please follow up with the ear nose and throat doctor listed in this paperwork.  Return instructions:  Please return to the Emergency Department if you experience worsening symptoms.  Please return to the emergency department for any fever or chills with your symptoms, redness or swelling behind the ear, or redness or swelling of the ear. Please return if you have any other emergent concerns.  Additional Information:   Your vital signs today were: BP (!) 158/108 (BP Location: Left Arm)    Pulse 80    Temp 99 F (37.2 C) (Oral)    Resp 18    SpO2 97%  If your blood pressure (BP) was elevated on multiple readings during this visit above 130 for the top number or above 80 for the bottom number, please have this repeated by your primary care provider within one month. --------------  Thank you for allowing us to participate in your care  today.

## 2017-08-10 ENCOUNTER — Ambulatory Visit (HOSPITAL_BASED_OUTPATIENT_CLINIC_OR_DEPARTMENT_OTHER): Payer: Medicaid Other

## 2017-08-17 ENCOUNTER — Telehealth: Payer: Self-pay | Admitting: Internal Medicine

## 2017-08-17 NOTE — Telephone Encounter (Signed)
After Hours Emergency Line:  Patient has been having sore throat and sinus congestion for the last couple of days. It is hard to breathe through her nose. She has been taking Tylenol, which has helped her sore throat. She has also been using Flonase and nasal saline. No fevers. No sick contacts.   Recommended that patient use Tylenol and Ibuprofen every 6 hours as needed for sore throat. She can also buy some OTC Afrin to help with the sinus congestion. If she is not getting better in the next 3-5 days, she should call our clinic to schedule an appointment to be evaluated. Patient voiced understanding.  Will forward to PCP.  Willadean CarolKaty Mayo, MD PGY-3

## 2017-08-23 ENCOUNTER — Ambulatory Visit: Payer: Medicaid Other | Admitting: Family Medicine

## 2017-09-05 NOTE — Telephone Encounter (Signed)
Error

## 2017-09-09 ENCOUNTER — Telehealth: Payer: Self-pay | Admitting: Student

## 2017-09-09 NOTE — Telephone Encounter (Signed)
FM after hour call response: Called patient in response to her page on after hour pager. Patient picked up the phone and verified her name and age.  CC: Chest pain HPI: Chest pain since this morning. Woke up with it. Pain is dull. Pain is over her upper back bilaterally. Pain is worse with movement. Had URI symptoms two weeks. Denies fever, dyspnea, nausea, vomiting or leg swelling. No unusual activity leading to this. Took 400 mg ibuprofen once. She is laughing as she taks  Red Flags: none.  Assessment: Likely MSK pain.  She has no cardiopulmonary symptoms.  Plan:  Recommended taking ibuprofen 600 mg 3 times daily as needed for pain.   Recommended calling our clinic on Monday in the morning if she continues to have pain.   Discussed return precautions.

## 2017-09-10 ENCOUNTER — Ambulatory Visit (HOSPITAL_BASED_OUTPATIENT_CLINIC_OR_DEPARTMENT_OTHER): Payer: Medicaid Other | Attending: Family Medicine

## 2017-09-19 ENCOUNTER — Telehealth: Payer: Self-pay | Admitting: Family Medicine

## 2017-09-19 NOTE — Telephone Encounter (Signed)
**  After Hours/ Emergency Line Call*  Received a call to report that [name] is constipated.  She notes that she has only had 1 BM this week. She notes that she drinks water. She has been using Miralax once a day. She is also taking fiber supplements. Endorsing feeling bloated.  Denying nausea, vomiting, hematochezia, fever.  Recommended that she needs to do a  Miralax Clean out  Take 8 capfuls of miralax in 64 oz of fluid  Continue to drink at least eight 8 oz glasses of water throughout the day  Repeat with another 8 capfuls of miralax in 64 oz of fluid if not having large amount of stool  Red flags discussed.  Will forward to PCP.  Beaulah Dinninghristina M Azhar Yogi, MD PGY-3, [facility]

## 2017-10-05 ENCOUNTER — Encounter: Payer: Self-pay | Admitting: Internal Medicine

## 2017-11-02 ENCOUNTER — Encounter: Payer: Self-pay | Admitting: Internal Medicine

## 2017-11-02 ENCOUNTER — Ambulatory Visit: Payer: Medicaid Other | Admitting: Internal Medicine

## 2017-11-02 VITALS — BP 121/80 | HR 91 | Temp 98.2°F | Wt 231.6 lb

## 2017-11-02 DIAGNOSIS — K5900 Constipation, unspecified: Secondary | ICD-10-CM | POA: Diagnosis not present

## 2017-11-02 DIAGNOSIS — G8929 Other chronic pain: Secondary | ICD-10-CM | POA: Diagnosis not present

## 2017-11-02 DIAGNOSIS — M545 Low back pain: Secondary | ICD-10-CM | POA: Diagnosis not present

## 2017-11-02 DIAGNOSIS — J301 Allergic rhinitis due to pollen: Secondary | ICD-10-CM

## 2017-11-02 DIAGNOSIS — G43009 Migraine without aura, not intractable, without status migrainosus: Secondary | ICD-10-CM | POA: Diagnosis not present

## 2017-11-02 DIAGNOSIS — I10 Essential (primary) hypertension: Secondary | ICD-10-CM

## 2017-11-02 MED ORDER — LORATADINE 10 MG PO TABS: 10.0000 mg | ORAL_TABLET | Freq: Every day | ORAL | 11 refills | Status: DC

## 2017-11-02 MED ORDER — TIZANIDINE HCL 4 MG PO CAPS: 4.0000 mg | ORAL_CAPSULE | Freq: Three times a day (TID) | ORAL | 2 refills | Status: DC

## 2017-11-02 MED ORDER — HYDROCHLOROTHIAZIDE 12.5 MG PO CAPS: 12.5000 mg | ORAL_CAPSULE | Freq: Every day | ORAL | 3 refills | Status: DC

## 2017-11-02 MED ORDER — DOCUSATE SODIUM 100 MG PO CAPS: 100.0000 mg | ORAL_CAPSULE | Freq: Two times a day (BID) | ORAL | 99 refills | Status: DC

## 2017-11-02 MED ORDER — FLUTICASONE PROPIONATE 50 MCG/ACT NA SUSP: 2.0000 | Freq: Every day | NASAL | 6 refills | Status: DC

## 2017-11-02 MED ORDER — POLYETHYLENE GLYCOL 3350 17 G PO PACK
17.0000 g | PACK | Freq: Three times a day (TID) | ORAL | 2 refills | Status: DC
Start: 2017-11-02 — End: 2019-03-28

## 2017-11-02 MED ORDER — PROPRANOLOL HCL 20 MG PO TABS: 20.0000 mg | ORAL_TABLET | Freq: Two times a day (BID) | ORAL | 0 refills | Status: DC

## 2017-11-02 MED ORDER — SUMATRIPTAN SUCCINATE 50 MG PO TABS: ORAL_TABLET | ORAL | 5 refills | Status: DC

## 2017-11-02 MED ORDER — MONTELUKAST SODIUM 10 MG PO TABS: 10.0000 mg | ORAL_TABLET | Freq: Every day | ORAL | 3 refills | Status: DC

## 2017-11-02 NOTE — Patient Instructions (Signed)
I am going to start you on a medication to help prevent migraines called propanolol.  Please take this once in the morning and once at night.  We can increase the dose as needed if it does not help at the current dose.  Please follow-up with me in 1 month

## 2017-11-02 NOTE — Assessment & Plan Note (Signed)
Blood pressure well controlled  continue hydrochlorothiazide

## 2017-11-02 NOTE — Assessment & Plan Note (Signed)
Refill MiraLAX and Colace medication.  Patient continues to have significant issues with constipation.  Discussed diet changes.

## 2017-11-02 NOTE — Progress Notes (Signed)
   [facility] Ann CharsAsiyah Mikell, MD Phone: [phone]  Reason For Visit: Follow up   #Migranes  -She with history of chronic migraines since she was a teenager.  Her current headaches are similar to what they have always been no new changes -Continues to have chronic migraines.  They last between 12 and 24 hours.  She has been taking Imitrex to help with these migraines as well as NSAIDs such as ibuprofen.  She indicates that the Imitrex helps decrease the migraine however she would like to try something that would help stop her from getting migraines.  #CHRONIC HTN Current Meds - HCTZ  Reports good compliance, took meds today. Tolerating well, w/o complaints. Lifestyle -did not discuss at visit Denies CP, dyspnea, HA, edema, dizziness / lightheadedness  #Constipation Patient also has a history of constipation and has been given MiraLAX in the past.  She would like to restart taking the MiraLAX.  She denies any symptoms of diarrhea, blood in stools.  Past Medical History Reviewed problem list.  Medications- reviewed and updated No additions to family history  Objective: BP 121/80 (BP Location: Left Arm, Patient Position: Sitting, Cuff Size: Normal)   Pulse 91   Temp 98.2 F (36.8 C) (Oral)   Wt 231 lb 9.6 oz (105.1 kg)   LMP 10/20/2017 (Approximate)   SpO2 99%   BMI 34.20 kg/m  Gen: NAD, alert, cooperative with exam Cardio: regular rate and rhythm, S1S2 heard, no murmurs appreciated Pulm: clear to auscultation bilaterally, no wheezes, rhonchi or rales MSK: Normal gait and station Skin: dry, intact, no rashes or lesions Neuro: Strength and sensation grossly intact   Assessment/Plan: See problem based a/p  Migraine without aura Given patient has migraines several times a month.  She would like to try a prophylactic approach.  We will start her on propanolol 40 mg twice daily.  We will give her Imitrex to help with headaches if they continue follow-up  in 2 weeks  Essential hypertension, benign Blood pressure well controlled  continue hydrochlorothiazide  Constipation Refill MiraLAX and Colace medication.  Patient continues to have significant issues with constipation.  Discussed diet changes.

## 2017-11-02 NOTE — Assessment & Plan Note (Signed)
Given patient has migraines several times a month.  She would like to try a prophylactic approach.  We will start her on propanolol 40 mg twice daily.  We will give her Imitrex to help with headaches if they continue follow-up in 2 weeks

## 2017-11-20 ENCOUNTER — Encounter: Payer: Self-pay | Admitting: Internal Medicine

## 2017-11-28 ENCOUNTER — Telehealth: Payer: Self-pay

## 2017-11-28 NOTE — Telephone Encounter (Signed)
Pt called nurse line states she has had a HA for 2 weeks she cannot get rid of. Has taken all rx and otc meds she has. Asked patient if she has taken her BP recently and she has not. Pt scheduled to come in for eval tomorrow. Shawna OrleansMeredith B Thomsen, RN

## 2017-11-29 ENCOUNTER — Encounter: Payer: Self-pay | Admitting: Family Medicine

## 2017-11-29 ENCOUNTER — Ambulatory Visit: Payer: Medicaid Other | Admitting: Family Medicine

## 2017-11-29 ENCOUNTER — Other Ambulatory Visit: Payer: Self-pay

## 2017-11-29 DIAGNOSIS — G43009 Migraine without aura, not intractable, without status migrainosus: Secondary | ICD-10-CM | POA: Diagnosis not present

## 2017-11-29 MED ORDER — KETOROLAC TROMETHAMINE 30 MG/ML IJ SOLN
30.0000 mg | Freq: Once | INTRAMUSCULAR | Status: AC
  Administered 2017-11-29: 30 mg via INTRAMUSCULAR

## 2017-11-29 MED ORDER — KETOROLAC TROMETHAMINE 30 MG/ML IJ SOLN: 30.0000 mg | Freq: Once | INTRAMUSCULAR | Status: DC

## 2017-11-29 MED ORDER — PROPRANOLOL HCL 20 MG PO TABS: 20.0000 mg | ORAL_TABLET | Freq: Three times a day (TID) | ORAL | 0 refills | Status: DC

## 2017-11-29 NOTE — Progress Notes (Signed)
   Subjective:    Patient ID: Linward Headlandameka C Hermida, female    DOB: 07-May-1976, 42 y.o.   MRN: [id number]   CC: headaches  HPI: Migraine: -Patient with migraine for 2 weeks.  She has tried Imitrex, propanolol 40 mg twice daily, and Excedrin multiple times. -Patient reports that this is helped with the shortness of the pain but the dullness has not gone away.  She feels as if there is pressure in her ears and there is constant throbbing.  Reports that she had a test today and felt was approximate on her head and her neck is hurting due to the headache -Patient reports that she just needs help with this today.  She denies any nausea or vomiting.  She does report that her throat is hurting and feels like there is a fullness in it when she has these headaches. -[facility], she does not know how long ago this was, but reports that the shot helped her with her headache -Patient denies change in vision but does have some sensitivity to light.  Patient has also reported that she has been sleeping more than in order to try to sleep off her headache.  Smoking status reviewed  Review of Systems Per HPI, else denies recent illness, fever, changes in vision, N/V/D   Patient Active Problem List   Diagnosis Date Noted  . Screening for malignant neoplasm of cervix 05/30/2017  . Snoring 05/29/2017  . Left hip pain 01/11/2017  . Otitis media 06/20/2016  . Bipolar 2 disorder (HCC) 03/25/2016  . Bilateral external ear infections 09/11/2014  . Essential hypertension, benign 09/27/2013  . GERD (gastroesophageal reflux disease) 05/02/2013  . Chronic lumbar pain 10/02/2012  . Migraine without aura 07/05/2012  . Constipation 06/24/2011  . Anxiety state 08/18/2010  . TOBACCO ABUSE 08/18/2010  . OBESITY NOS 01/26/2007  . ALLERGIC RHINITIS, SEASONAL 01/26/2007     Objective:  BP 120/82   Pulse 72   Temp 98.3 F (36.8 C) (Oral)   Ht 5\' 9"  (1.753 m)   Wt 233 lb (105.7 kg)    SpO2 98%   BMI 34.41 kg/m  Vitals and nursing note reviewed  General: NAD, pleasant Cardiac: RRR, normal heart sounds, no murmurs Respiratory: normal effort Neck: FROM with tightness of trapezius and other neck muscles Extremities: no edema or cyanosis. WWP. Skin: warm and dry, no rashes noted Neuro: alert and oriented, no focal deficits Psych: normal affect   Assessment & Plan:    Migraine without aura Patient given 30 mg injection of Toradol in office in order to help with current headache.  Will increase propranolol to 40 mg 3 times daily per recommendations maximum dosing should be between 160 to 240 mg/day, will likely need to continue to titrate up.  BP today WNL at 120/82.    [place] Remedios Mckone, DO Family Medicine Resident PGY-1

## 2017-11-29 NOTE — Assessment & Plan Note (Addendum)
Patient given 30 mg injection of Toradol in office in order to help with current headache.  Will increase propranolol to 40 mg 3 times daily per recommendations maximum dosing should be between 160 to 240 mg/day, will likely need to continue to titrate up.  BP today WNL at 120/82.

## 2017-11-29 NOTE — Patient Instructions (Signed)
Thank you for coming to see me today. It was a pleasure! Today we talked about:   Your headache.  I hope that the shot of Toradol will help with the pain.  Please return in 1 week if the headache has not resolved.  Please also start taking propranolol 40 mg 3 times a day or every 8 hours.  Please follow-up with your primary care physician as needed.  If you have any questions or concerns, please do not hesitate to call the office at [phone].  Take Care,   [place] Delores Edelstein, DO Ketorolac injection What is this medicine? KETOROLAC (kee toe ROLE ak) is a non-steroidal anti-inflammatory drug (NSAID). It is used to treat moderate to severe pain for up to 5 days. It is commonly used after surgery. This medicine should not be used for more than 5 days. This medicine may be used for other purposes; ask your health care provider or pharmacist if you have questions. COMMON BRAND NAME(S): Toradol  What side effects may I notice from receiving this medicine? Side effects that you should report to your doctor or health care professional as soon as possible: -allergic reactions like skin rash, itching or hives, swelling of the face, lips, or tongue -breathing problems -high blood pressure -nausea, vomiting -redness, blistering, peeling or loosening of the skin, including inside the mouth -severe stomach pain -signs and symptoms of bleeding such as bloody or black, tarry stools; red or dark-brown urine; spitting up blood or brown material that looks like coffee grounds; red spots on the skin; unusual bruising or bleeding from the eye, gums, or nose -signs and symptoms of a blood clot changes in vision; chest pain; severe, sudden headache; trouble speaking; sudden numbness or weakness of the face, arm, or leg -trouble passing urine or change in the amount of urine -unexplained weight gain or swelling -unusually weak or tired -yellowing of eyes or skin Side effects that usually do not require  medical attention (report to your doctor or health care professional if they continue or are bothersome): -diarrhea -dizziness -headache -heartburn This list may not describe all possible side effects. Call your doctor for medical advice about side effects. You may report side effects to FDA at 1-800-FDA-1088.  NOTE: This sheet is a summary. It may not cover all possible information. If you have questions about this medicine, talk to your doctor, pharmacist, or health care provider.  2018 Elsevier/Gold Standard (2016-08-17 14:38:40)

## 2017-12-03 ENCOUNTER — Encounter: Payer: Self-pay | Admitting: Family Medicine

## 2018-01-04 ENCOUNTER — Other Ambulatory Visit: Payer: Self-pay

## 2018-01-04 DIAGNOSIS — G43009 Migraine without aura, not intractable, without status migrainosus: Secondary | ICD-10-CM

## 2018-01-04 MED ORDER — PROPRANOLOL HCL 20 MG PO TABS: 20.0000 mg | ORAL_TABLET | Freq: Three times a day (TID) | ORAL | 1 refills | Status: DC

## 2018-01-17 ENCOUNTER — Ambulatory Visit: Payer: Medicaid Other | Admitting: Internal Medicine

## 2018-01-17 ENCOUNTER — Other Ambulatory Visit: Payer: Self-pay

## 2018-01-17 ENCOUNTER — Encounter: Payer: Self-pay | Admitting: Internal Medicine

## 2018-01-17 VITALS — BP 122/82 | HR 84 | Temp 98.6°F | Ht 69.0 in | Wt 235.0 lb

## 2018-01-17 DIAGNOSIS — T7840XA Allergy, unspecified, initial encounter: Secondary | ICD-10-CM | POA: Diagnosis not present

## 2018-01-17 DIAGNOSIS — H938X3 Other specified disorders of ear, bilateral: Secondary | ICD-10-CM

## 2018-01-17 DIAGNOSIS — R21 Rash and other nonspecific skin eruption: Secondary | ICD-10-CM | POA: Diagnosis not present

## 2018-01-17 DIAGNOSIS — J301 Allergic rhinitis due to pollen: Secondary | ICD-10-CM | POA: Diagnosis not present

## 2018-01-17 DIAGNOSIS — H6093 Unspecified otitis externa, bilateral: Secondary | ICD-10-CM | POA: Diagnosis not present

## 2018-01-17 MED ORDER — OLOPATADINE HCL 0.2 % OP SOLN: 1.0000 [drp] | Freq: Two times a day (BID) | OPHTHALMIC | 0 refills | Status: DC

## 2018-01-17 MED ORDER — EPINEPHRINE 0.3 MG/0.3ML IJ SOAJ: 0.3000 mg | Freq: Once | INTRAMUSCULAR | 1 refills | Status: AC

## 2018-01-17 MED ORDER — HYDROCORTISONE-ACETIC ACID 1-2 % OT SOLN: 3.0000 [drp] | Freq: Two times a day (BID) | OTIC | 0 refills | Status: DC

## 2018-01-17 NOTE — Assessment & Plan Note (Signed)
Itchy ear, no sign of infection. - acetic acid-hydrocortisone (VOSOL-HC) OTIC solution; Place 3 drops into both ears 2 (two) times daily.  Dispense: 10 mL; Refill: 0

## 2018-01-17 NOTE — Patient Instructions (Signed)
You can use hydrocortisone cream on your face for about 1 week make sure not to place it anywhere near your eyes.  For your ears have provided you some drops which will help with the irritation.  I have also refilled your Pataday

## 2018-01-17 NOTE — Assessment & Plan Note (Signed)
Itchy cheeks and chin, no signs of erythema making rosacea unlikely  - Advised to use 1% hydrocortisone cream for 1 week, make sure to avoid the eyes - Follow-up as needed if no improvement

## 2018-01-17 NOTE — Progress Notes (Signed)
   [name] [name], MD Phone: [phone]  Reason For Visit: SDA for Rash   Rash Presenting with 5 days of rash.  She states that she has had a dry skin notable on her bilateral cheeks and chin.  She states that it is very itchy.  And when she washes her face is dry.  No notable erythema.  No masses.  No swelling.  She has recently started a new medication called oxycodone but seen instead of her previous Latuda with her psychiatrist.  She denies any new facial products.  She denies any mouth ulcers.  No fevers or chills.  He does indicate having itchy ears as well.  Past Medical History Reviewed problem list.  Medications- reviewed and updated No additions to family history Social history- patient is a non- smoker  Objective: BP 122/82   Pulse 84   Temp 98.6 F (37 C) (Oral)   Ht  (1.753 m)   Wt 235 lb (106.6 kg)   SpO2 98%   BMI 34.70 kg/m  Gen: NAD, alert, cooperative with exam HEENT: Normal    Neck: No masses palpated. No lymphadenopathy    Ears: Tympanic membranes intact, normal light reflex, no erythema, no bulging    Eyes: PERRLA, EOMI    Nose: nasal turbinates moist Skin:No notable dryness on examination, Neuro: Strength and sensation grossly intact   Assessment/Plan: See problem based a/p  Bilateral external ear infections Itchy ear, no sign of infection. - acetic acid-hydrocortisone (VOSOL-HC) OTIC solution; Place 3 drops into both ears 2 (two) times daily.  Dispense: 10 mL; Refill: 0     Rash and nonspecific skin eruption Itchy cheeks and chin, no signs of erythema making rosacea unlikely  - Advised to use 1% hydrocortisone cream for 1 week, make sure to avoid the eyes - Follow-up as needed if no improvement

## 2018-01-24 ENCOUNTER — Telehealth: Payer: Self-pay

## 2018-01-24 NOTE — Telephone Encounter (Signed)
Received fax from [facility] requesting prior authorization of Hydrocortisone-Acetic Acid solution.  Form placed in MD's box for completion along with Medicaid formulary. Ples SpecterAlisa Brake, RN [facility] RN)

## 2018-01-25 NOTE — Telephone Encounter (Signed)
Filled out prior authorization  

## 2018-01-25 NOTE — Telephone Encounter (Signed)
Prior approval for Hydrocortisone-Acetic acid solution completed via [employer].  Med approved for 6/619 - 03/26/18  Prior approval # [id number].  [facility] pharmacy informed.   Ples SpecterAlisa Brake, RN [facility] RN)

## 2018-02-03 ENCOUNTER — Other Ambulatory Visit: Payer: Self-pay | Admitting: Internal Medicine

## 2018-02-03 DIAGNOSIS — J301 Allergic rhinitis due to pollen: Secondary | ICD-10-CM

## 2018-02-04 ENCOUNTER — Encounter: Payer: Self-pay | Admitting: Internal Medicine

## 2018-02-05 NOTE — Telephone Encounter (Signed)
PT calls nurse line. She needs these as soon as possible, because her "allergies are bad this year" [name], [name], CMA

## 2018-02-15 ENCOUNTER — Other Ambulatory Visit: Payer: Self-pay | Admitting: Internal Medicine

## 2018-02-19 DIAGNOSIS — F3181 Bipolar II disorder: Secondary | ICD-10-CM | POA: Diagnosis not present

## 2018-02-21 ENCOUNTER — Other Ambulatory Visit: Payer: Self-pay

## 2018-02-21 ENCOUNTER — Ambulatory Visit: Payer: Medicaid Other | Admitting: Family Medicine

## 2018-02-21 ENCOUNTER — Encounter (HOSPITAL_COMMUNITY): Payer: Self-pay | Admitting: Emergency Medicine

## 2018-02-21 ENCOUNTER — Ambulatory Visit (HOSPITAL_COMMUNITY)
Admission: EM | Admit: 2018-02-21 | Discharge: 2018-02-21 | Disposition: A | Discharge status: Home / Self Care | Payer: Medicaid Other | Attending: Internal Medicine | Admitting: Internal Medicine

## 2018-02-21 DIAGNOSIS — H60503 Unspecified acute noninfective otitis externa, bilateral: Secondary | ICD-10-CM | POA: Diagnosis not present

## 2018-02-21 DIAGNOSIS — H66005 Acute suppurative otitis media without spontaneous rupture of ear drum, recurrent, left ear: Secondary | ICD-10-CM

## 2018-02-21 MED ORDER — AMOXICILLIN-POT CLAVULANATE 875-125 MG PO TABS: 1.0000 | ORAL_TABLET | Freq: Two times a day (BID) | ORAL | 0 refills | Status: AC

## 2018-02-21 MED ORDER — CIPROFLOXACIN-HYDROCORTISONE 0.2-1 % OT SUSP: 3.0000 [drp] | Freq: Two times a day (BID) | OTIC | 0 refills | Status: DC

## 2018-02-21 NOTE — Discharge Instructions (Addendum)
Rest and drink plenty of fluids Prescribed augmentin 875 for 7-10 days Prescribed ciprofloxacin ear drops Take medications as directed and to completion Continue to use OTC ibuprofen and/ or tylenol as needed for pain control Follow up with PCP if symptoms persists Return here or go to the ER if you have any new or worsening symptoms

## 2018-02-21 NOTE — ED Triage Notes (Signed)
Bilateral ear itching and soreness.  Itching for a week.  Outer ear itching

## 2018-02-21 NOTE — ED Provider Notes (Signed)
[facility]   [id number] 02/21/18 Arrival Time: 1702  SUBJECTIVE: History from: patient.  [name] is a 42 y.o. female who presents with of bilateral ear pain and itching L>R for the past the week.  Denies a precipitating event, such as swimming or wearing ear plugs.  Patient states the pain is constant and achy in character.  Patient has tried acetic acid-hydrocortisone without relief.  Symptoms are made worse with lying down.  Reports similar symptoms in the past that improved.    Denies fever, chills, fatigue, sinus pain, rhinorrhea, ear discharge, sore throat, SOB, wheezing, chest pain, nausea, changes in bowel or bladder habits.    ROS: As per HPI.  Past Medical History:  Diagnosis Date  . Hypertension   . Migraine   . Vertigo    History reviewed. No pertinent surgical history. Allergies  Allergen Reactions  . Aripiprazole     REACTION: Disoriented.  . Escitalopram Oxalate     REACTION: disoriented.  . Latex Itching   No current facility-administered medications on file prior to encounter.    Current Outpatient Medications on File Prior to Encounter  Medication Sig Dispense Refill  . DULoxetine (CYMBALTA) 30 MG capsule Take 30 mg by mouth daily.    . Oxcarbazepine (TRILEPTAL) 300 MG tablet Take 300 mg by mouth 2 (two) times daily.    Marland Kitchen acetic acid-hydrocortisone (VOSOL-HC) OTIC solution Place 3 drops into both ears 2 (two) times daily. 10 mL 0  . diclofenac (VOLTAREN) 75 MG EC tablet Take 1 tablet (75 mg total) by mouth 2 (two) times daily. 30 tablet 0  . docusate sodium (COLACE) 100 MG capsule Take 1 capsule (100 mg total) by mouth 2 (two) times daily. 60 capsule prn  . fluticasone (FLONASE) 50 MCG/ACT nasal spray Place 2 sprays into both nostrils daily. 16 g 6  . glycerin, Pediatric, 1.2 g SUPP Place 1 suppository (1.2 g total) rectally daily as needed for moderate constipation. 10 suppository 1  . hydrochlorothiazide (MICROZIDE) 12.5 MG capsule Take 1  capsule (12.5 mg total) by mouth daily. 90 capsule 3  . loratadine (CLARITIN) 10 MG tablet Take 1 tablet (10 mg total) by mouth daily. 30 tablet 11  . meclizine (ANTIVERT) 12.5 MG tablet Take 1 tablet (12.5 mg total) by mouth 3 (three) times daily as needed for dizziness. 30 tablet 1  . montelukast (SINGULAIR) 10 MG tablet Take 1 tablet (10 mg total) by mouth at bedtime. 30 tablet 3  . omeprazole (PRILOSEC) 20 MG capsule TAKE 1 CAPSULE BY MOUTH EVERY DAY 30 capsule 2  . PATADAY 0.2 % SOLN APPLY 1 DROP TO EYE 2 (TWO) TIMES DAILY. 2.5 mL 0  . polyethylene glycol (MIRALAX / GLYCOLAX) packet Take 17 g by mouth 3 (three) times daily. Until stooling regularly 100 packet 2  . propranolol (INDERAL) 20 MG tablet Take 1 tablet (20 mg total) by mouth 3 (three) times daily. 90 tablet 1  . SUMAtriptan (IMITREX) 50 MG tablet TAKE 1 TABLET BY MOUTH FOR HEADACHE. MAY REPEAT IN 2 HOURS IF HEADACHE PERSIST 10 tablet 5  . tiZANidine (ZANAFLEX) 4 MG capsule Take 1 capsule (4 mg total) by mouth 3 (three) times daily. Do not take with flexeril 30 capsule 2   Social History   Socioeconomic History  . Marital status: Single    Spouse name: Not on file  . Number of children: Not on file  . Years of education: Not on file  . Highest education level: Not  on file  Occupational History  . Not on file  Social Needs  . Financial resource strain: Not on file  . Food insecurity:    Worry: Not on file    Inability: Not on file  . Transportation needs:    Medical: Not on file    Non-medical: Not on file  Tobacco Use  . Smoking status: Current Every Day Smoker    Packs/day: 0.50    Years: 19.00    Pack years: 9.50    Types: Cigarettes  . Smokeless tobacco: Never Used  Substance and Sexual Activity  . Alcohol use: No  . Drug use: No  . Sexual activity: Yes    Birth control/protection: None  Lifestyle  . Physical activity:    Days per week: Not on file    Minutes per session: Not on file  . Stress: Not on  file  Relationships  . Social connections:    Talks on phone: Not on file    Gets together: Not on file    Attends religious service: Not on file    Active member of club or organization: Not on file    Attends meetings of clubs or organizations: Not on file    Relationship status: Not on file  . Intimate partner violence:    Fear of current or ex partner: Not on file    Emotionally abused: Not on file    Physically abused: Not on file    Forced sexual activity: Not on file  Other Topics Concern  . Not on file  Social History Narrative  . Not on file   Family History  Problem Relation Age of Onset  . Hypertension Mother   . Diabetes Mother     OBJECTIVE:  Vitals:   02/21/18 1808  BP: 127/88  Pulse: 72  Resp: 20  Temp: 98 F (36.7 C)  TempSrc: Temporal  SpO2: 100%     General appearance: AO; not in acute distress HEENT: Ears: EAC swollen bilaterally L>R, RT TM pearly gray with visible cone of light, without erythema; RT TM injected, no mastoid tenderness; Eyes: PERRL, EOMI grossly; Sinuses nontender to palpation; Nose: clear rhinorrhea; Throat: oropharynx mildly erythematous, tonsils 1+ without white tonsillar exudates, uvula midline Neck: supple without LAD Lungs: CTA bilaterally without adventitious breath sounds Heart: regular rate and rhythm.  Radial pulses 2+ symmetrical bilaterally Skin: warm and dry Psychological: alert and cooperative; normal mood and affect   ASSESSMENT & PLAN:  1. Acute otitis externa of both ears, unspecified type   2. Recurrent acute suppurative otitis media without spontaneous rupture of left tympanic membrane     Meds ordered this encounter  Medications  . amoxicillin-clavulanate (AUGMENTIN) 875-125 MG tablet    Sig: Take 1 tablet by mouth every 12 (twelve) hours for 10 days.    Dispense:  20 tablet    Refill:  0    Order Specific Question:   Supervising Provider    Answer:   Isa Rankin [phone]  .  ciprofloxacin-hydrocortisone (CIPRO HC) OTIC suspension    Sig: Place 3 drops into both ears 2 (two) times daily.    Dispense:  10 mL    Refill:  0    Order Specific Question:   Supervising Provider    Answer:   Isa Rankin [[id number]]    Rest and drink plenty of fluids Prescribed augmentin 875 for 7-10 days Prescribed ciprofloxacin ear drops Take medications as directed and to completion Continue to use OTC ibuprofen  and/ or tylenol as needed for pain control Follow up with PCP if symptoms persists Return here or go to the ER if you have any new or worsening symptoms   Reviewed expectations re: course of current medical issues. Questions answered. Outlined signs and symptoms indicating need for more acute intervention. Patient verbalized understanding. After Visit Summary given.         Rennis HardingWurst, Chaquana Nichols, PA-C 02/21/18 1912

## 2018-03-05 DIAGNOSIS — F3181 Bipolar II disorder: Secondary | ICD-10-CM | POA: Diagnosis not present

## 2018-03-07 DIAGNOSIS — F3132 Bipolar disorder, current episode depressed, moderate: Secondary | ICD-10-CM | POA: Diagnosis not present

## 2018-03-07 DIAGNOSIS — F411 Generalized anxiety disorder: Secondary | ICD-10-CM | POA: Diagnosis not present

## 2018-03-07 DIAGNOSIS — F4310 Post-traumatic stress disorder, unspecified: Secondary | ICD-10-CM | POA: Diagnosis not present

## 2018-04-04 DIAGNOSIS — F3181 Bipolar II disorder: Secondary | ICD-10-CM | POA: Diagnosis not present

## 2018-04-27 DIAGNOSIS — F3181 Bipolar II disorder: Secondary | ICD-10-CM | POA: Diagnosis not present

## 2018-05-18 DIAGNOSIS — F3181 Bipolar II disorder: Secondary | ICD-10-CM | POA: Diagnosis not present

## 2018-05-25 DIAGNOSIS — F3181 Bipolar II disorder: Secondary | ICD-10-CM | POA: Diagnosis not present

## 2018-05-31 ENCOUNTER — Ambulatory Visit: Payer: Medicaid Other | Admitting: Student in an Organized Health Care Education/Training Program

## 2018-05-31 ENCOUNTER — Other Ambulatory Visit: Payer: Self-pay

## 2018-05-31 VITALS — BP 116/74 | HR 89 | Temp 99.4°F | Ht 69.0 in | Wt 237.8 lb

## 2018-05-31 DIAGNOSIS — R109 Unspecified abdominal pain: Secondary | ICD-10-CM

## 2018-05-31 DIAGNOSIS — G43701 Chronic migraine without aura, not intractable, with status migrainosus: Secondary | ICD-10-CM | POA: Diagnosis not present

## 2018-05-31 DIAGNOSIS — Z23 Encounter for immunization: Secondary | ICD-10-CM | POA: Diagnosis not present

## 2018-05-31 LAB — POCT URINE PREGNANCY: PREG TEST UR: NEGATIVE

## 2018-05-31 MED ORDER — KETOROLAC TROMETHAMINE 30 MG/ML IJ SOLN
30.0000 mg | Freq: Once | INTRAMUSCULAR | Status: AC
  Administered 2018-05-31: 30 mg via INTRAMUSCULAR

## 2018-05-31 MED ORDER — ONDANSETRON HCL 4 MG PO TABS: 4.0000 mg | ORAL_TABLET | Freq: Three times a day (TID) | ORAL | 0 refills | Status: DC | PRN

## 2018-05-31 MED ORDER — TIZANIDINE HCL 4 MG PO CAPS: 4.0000 mg | ORAL_CAPSULE | Freq: Two times a day (BID) | ORAL | 0 refills | Status: DC | PRN

## 2018-05-31 NOTE — Progress Notes (Signed)
   CC: Migraine  HPI: [name] is a 42 y.o. female with PMH significant for migraines  duration.   Patient has a history of migraines.  She reports that she woke up 4 days ago with headache, dizziness, and nausea.  She had vomiting on the first day of illness, however has not vomited since.  She continues to have a mild headache behind her eyes.  She continues to be very nauseous.  She has been trying to stay hydrated, however feels uncomfortable with every bit of fluid that she drinks.  She is keeping down fluids.  She has been eating small frequent meals.  She has not had any diarrhea.  She has not had any fevers.  She has not had any chest pain.  She reports that for headaches she has been using sumatriptan.  She has propanolol as well.  She has not been using Zanaflex.  She reports that previously she was given a Toradol shot and this was extremely helpful for her to break her migraine.   Review of Symptoms:  See HPI for ROS.   CC, SH/smoking status, and VS noted.  Objective: BP 116/74   Pulse 89   Temp 99.4 F (37.4 C) (Oral)   Ht 5\' 9"  (1.753 m)   Wt 237 lb 12.8 oz (107.9 kg)   LMP 05/13/2018 (Exact Date)   SpO2 97%   BMI 35.12 kg/m  GEN: NAD, alert, cooperative, and pleasant. RESPIRATORY: Comfortable work of breathing, speaks in full sentences CV: Regular rate noted, distal extremities well perfused and warm without edema GI: Soft, nondistended SKIN: warm and dry, no rashes or lesions NEURO: II-XII grossly intact MSK: Moves 4 extremities equally PSYCH: AAOx3, appropriate affect   Assessment and plan:  1. Status migrainosus  -patient's dizziness, headache, and nausea are consistent with previous migraine symptoms.  We will try to break the migraine it using a Toradol shot.  Also sent in Zanaflex.  Patient can use Benadryl, however advised her to be careful with using multiple sedating medicines.  She can continue her prophylactic propanolol. -Strict return  precautions advised - Could consider a neurology consult versus alternative abortive medications if patient follows up with continued symptoms -Pregnancy test was negative  Orders Placed This Encounter  Procedures  . POCT urine pregnancy    No orders of the defined types were placed in this encounter.  Howard Pouch, MD,MS,  PGY3 05/31/2018 2:05 PM

## 2018-05-31 NOTE — Patient Instructions (Signed)
It was a pleasure seeing you today in our clinic. Here is the treatment plan we have discussed and agreed upon together:  You are given a shot to help with pain in the office today.    Please take the muscle relaxer and nausea medicine as needed for your headaches.  You can use Benadryl, however these be careful with multiple sedating medicines.  Do not take these medicines before driving.  Do not take these medicines if you are already sleepy.  Is a call or follow-up if you continue to have symptoms after a few days.  Our clinic's number is [phone]. Please call with questions or concerns about what we discussed today.  Be well, Dr. Mosetta Putt

## 2018-06-15 DIAGNOSIS — F3181 Bipolar II disorder: Secondary | ICD-10-CM | POA: Diagnosis not present

## 2018-07-22 ENCOUNTER — Other Ambulatory Visit: Payer: Self-pay | Admitting: Internal Medicine

## 2018-07-30 DIAGNOSIS — F3181 Bipolar II disorder: Secondary | ICD-10-CM | POA: Diagnosis not present

## 2018-08-05 ENCOUNTER — Other Ambulatory Visit: Payer: Self-pay

## 2018-08-20 ENCOUNTER — Encounter: Payer: Self-pay | Admitting: Student in an Organized Health Care Education/Training Program

## 2018-08-20 ENCOUNTER — Ambulatory Visit: Payer: Medicaid Other | Admitting: Family Medicine

## 2018-08-20 NOTE — Progress Notes (Deleted)
   Subjective   Patient ID: [name]    DOB: 08/02/1976, 42 y.o. female   MRN: [id number]  CC: "***"  HPI: [name] is a 42 y.o. female who presents to clinic today for the following:  EAR PAIN  Location: *** Ear pain started: *** Pain is: *** Severity: *** Medications tried: *** Recent ear trauma: *** Prior ear surgeries: *** Antibiotics in the last 30 days: *** History of diabetes: ***  Symptoms Ear discharge: *** Fever: *** Pain with chewing: *** Ringing in ears: *** Dizziness: *** Hearing loss: *** Rashes or blisters around ear: *** Weight loss: ***  ROS: see HPI for pertinent.  PMFSH: Reviewed. Smoking status reviewed. Medications reviewed.  Objective   There were no vitals taken for this visit. Vitals and nursing note reviewed.  General: well nourished, well developed, NAD with non-toxic appearance HEENT: normocephalic, atraumatic, moist mucous membranes Neck: supple, non-tender without lymphadenopathy Cardiovascular: regular rate and rhythm without murmurs, rubs, or gallops Lungs: clear to auscultation bilaterally with normal work of breathing Abdomen: soft, non-tender, non-distended, normoactive bowel sounds Skin: warm, dry, no rashes or lesions, cap refill < 2 seconds Extremities: warm and well perfused, normal tone, no edema  Assessment & Plan   No problem-specific Assessment & Plan notes found for this encounter.  No orders of the defined types were placed in this encounter.  No orders of the defined types were placed in this encounter.   Durward Parcelavid McMullen, DO [facility], PGY-3 08/20/2018, 1:43 PM

## 2018-08-24 ENCOUNTER — Encounter (HOSPITAL_COMMUNITY): Payer: Self-pay | Admitting: Emergency Medicine

## 2018-08-24 ENCOUNTER — Ambulatory Visit (HOSPITAL_COMMUNITY)
Admission: EM | Admit: 2018-08-24 | Discharge: 2018-08-24 | Disposition: A | Discharge status: Home / Self Care | Payer: Medicaid Other | Attending: Internal Medicine | Admitting: Internal Medicine

## 2018-08-24 DIAGNOSIS — F3181 Bipolar II disorder: Secondary | ICD-10-CM | POA: Diagnosis not present

## 2018-08-24 DIAGNOSIS — H6504 Acute serous otitis media, recurrent, right ear: Secondary | ICD-10-CM | POA: Diagnosis not present

## 2018-08-24 MED ORDER — PREDNISONE 10 MG PO TABS: 20.0000 mg | ORAL_TABLET | Freq: Every day | ORAL | 0 refills | Status: AC

## 2018-08-24 MED ORDER — FLUCONAZOLE 150 MG PO TABS: 150.0000 mg | ORAL_TABLET | Freq: Every day | ORAL | 1 refills | Status: DC

## 2018-08-24 MED ORDER — CEFDINIR 300 MG PO CAPS: 300.0000 mg | ORAL_CAPSULE | Freq: Two times a day (BID) | ORAL | 0 refills | Status: AC

## 2018-08-24 NOTE — ED Triage Notes (Signed)
Pt presents to [facility] for assessment of pain and decreased hearing in bilateral ears x 1 week.

## 2018-09-07 DIAGNOSIS — F3181 Bipolar II disorder: Secondary | ICD-10-CM | POA: Diagnosis not present

## 2018-09-14 DIAGNOSIS — F3181 Bipolar II disorder: Secondary | ICD-10-CM | POA: Diagnosis not present

## 2018-10-01 ENCOUNTER — Other Ambulatory Visit: Payer: Self-pay | Admitting: Student in an Organized Health Care Education/Training Program

## 2018-10-02 ENCOUNTER — Encounter (HOSPITAL_COMMUNITY): Payer: Self-pay | Admitting: Emergency Medicine

## 2018-10-02 ENCOUNTER — Ambulatory Visit (HOSPITAL_COMMUNITY)
Admission: EM | Admit: 2018-10-02 | Discharge: 2018-10-02 | Disposition: A | Discharge status: Home / Self Care | Payer: Medicaid Other | Attending: Family Medicine | Admitting: Family Medicine

## 2018-10-02 ENCOUNTER — Other Ambulatory Visit: Payer: Self-pay

## 2018-10-02 DIAGNOSIS — B349 Viral infection, unspecified: Secondary | ICD-10-CM | POA: Diagnosis not present

## 2018-10-02 MED ORDER — PREDNISONE 20 MG PO TABS: 40.0000 mg | ORAL_TABLET | Freq: Every day | ORAL | 0 refills | Status: AC

## 2018-10-02 MED ORDER — IPRATROPIUM BROMIDE 0.06 % NA SOLN: 2.0000 | Freq: Four times a day (QID) | NASAL | 0 refills | Status: DC

## 2018-10-02 NOTE — ED Triage Notes (Signed)
3-4 day history of feeling bad, feeling pain in face, pressure, feeling tired.

## 2018-10-02 NOTE — ED Provider Notes (Addendum)
[facility]    CSN: [id number] Arrival date & time: 10/02/18  [id number]     History   Chief Complaint Chief Complaint  Patient presents with  . URI    HPI [name] is a 43 y.o. female.   43 year old female comes in for 3 to 4-day history of URI symptoms.  Has had rhinorrhea, nasal congestion, postnasal drip, sinus pressure, cough.  States feels the mucus stuck in her throat, and is unable to cough it up.  Had subjective fever and chills the first 2 days that has since resolved.  Has been taking OTC cold medication with mild relief.  Current everyday smoker.  No obvious sick contact.     Past Medical History:  Diagnosis Date  . Hypertension   . Migraine   . Vertigo     Patient Active Problem List   Diagnosis Date Noted  . Bipolar 2 disorder (HCC) 03/25/2016  . Essential hypertension, benign 09/27/2013  . GERD (gastroesophageal reflux disease) 05/02/2013  . Chronic lumbar pain 10/02/2012  . Migraine without aura 07/05/2012  . Anxiety state 08/18/2010  . TOBACCO ABUSE 08/18/2010  . OBESITY NOS 01/26/2007  . ALLERGIC RHINITIS, SEASONAL 01/26/2007    History reviewed. No pertinent surgical history.  OB History   No obstetric history on file.      Home Medications    Prior to Admission medications   Medication Sig Start Date End Date Taking? Authorizing Provider  fluticasone (FLONASE) 50 MCG/ACT nasal spray Place 2 sprays into both nostrils daily. 11/02/17  Yes Mikell, Antionette PolesAsiyah Zahra, MD  hydrochlorothiazide (MICROZIDE) 12.5 MG capsule Take 1 capsule (12.5 mg total) by mouth daily. 11/02/17  Yes Mikell, Antionette PolesAsiyah Zahra, MD  montelukast (SINGULAIR) 10 MG tablet Take 1 tablet (10 mg total) by mouth at bedtime. 11/02/17  Yes Mikell, Antionette PolesAsiyah Zahra, MD  omeprazole (PRILOSEC) 20 MG capsule TAKE 1 CAPSULE BY MOUTH EVERY DAY 07/24/18  Yes Anderson, Chelsey L, DO  Oxcarbazepine (TRILEPTAL) 300 MG tablet Take 300 mg by mouth 2 (two) times daily.   Yes [provider]  propranolol (INDERAL) 20 MG tablet Take 1 tablet (20 mg total) by mouth 3 (three) times daily. 01/04/18  Yes Mikell, Antionette PolesAsiyah Zahra, MD  docusate sodium (COLACE) 100 MG capsule Take 1 capsule (100 mg total) by mouth 2 (two) times daily. 11/02/17   Mikell, Antionette PolesAsiyah Zahra, MD  glycerin, Pediatric, 1.2 g SUPP Place 1 suppository (1.2 g total) rectally daily as needed for moderate constipation. 12/12/16   McKeag, Janine OresIan D, MD  ipratropium (ATROVENT) 0.06 % nasal spray Place 2 sprays into both nostrils 4 (four) times daily. 10/02/18   Cathie HoopsYu, Anders Hohmann V, PA-C  loratadine (CLARITIN) 10 MG tablet Take 1 tablet (10 mg total) by mouth daily. 11/02/17   Mikell, Antionette PolesAsiyah Zahra, MD  meclizine (ANTIVERT) 12.5 MG tablet Take 1 tablet (12.5 mg total) by mouth 3 (three) times daily as needed for dizziness. 03/16/15   Nani RavensWight, Andrew M, MD  ondansetron (ZOFRAN) 4 MG tablet Take 1 tablet (4 mg total) by mouth every 8 (eight) hours as needed for nausea or vomiting. 05/31/18   Howard PouchFeng, Lauren, MD  PATADAY 0.2 % SOLN APPLY 1 DROP TO EYE 2 (TWO) TIMES DAILY. 02/05/18   Mikell, Antionette PolesAsiyah Zahra, MD  polyethylene glycol (MIRALAX / GLYCOLAX) packet Take 17 g by mouth 3 (three) times daily. Until stooling regularly 11/02/17   Mikell, Antionette PolesAsiyah Zahra, MD  predniSONE (DELTASONE) 20 MG tablet Take 2 tablets (40 mg total) by  mouth daily for 5 days. 10/02/18 10/07/18  Cathie HoopsYu, Kristion Holifield V, PA-C  SUMAtriptan (IMITREX) 50 MG tablet TAKE 1 TABLET BY MOUTH FOR HEADACHE. MAY REPEAT IN 2 HOURS IF HEADACHE PERSIST 11/02/17   Mikell, Antionette PolesAsiyah Zahra, MD  tiZANidine (ZANAFLEX) 4 MG capsule Take 1 capsule (4 mg total) by mouth 2 (two) times daily as needed (for migraine, NOT BEFORE DRIVING). Do not take with flexeril 05/31/18   Howard PouchFeng, Lauren, MD    Family History Family History  Problem Relation Age of Onset  . Hypertension Mother   . Diabetes Mother     Social History Social History   Tobacco Use  . Smoking status: Current Every Day Smoker    Packs/day: 0.50     Years: 19.00    Pack years: 9.50    Types: Cigarettes  . Smokeless tobacco: Never Used  Substance Use Topics  . Alcohol use: No  . Drug use: No     Allergies   Aripiprazole; Escitalopram oxalate; and Latex   Review of Systems Review of Systems  Reason unable to perform ROS: See HPI as above.     Physical Exam Triage Vital Signs ED Triage Vitals  Enc Vitals Group     BP 10/02/18 1008 (!) 143/95     Pulse Rate 10/02/18 1008 77     Resp 10/02/18 1008 16     Temp 10/02/18 1008 98.6 F (37 C)     Temp Source 10/02/18 1008 Oral     SpO2 10/02/18 1008 100 %     Weight --      Height --      Head Circumference --      Peak Flow --      Pain Score 10/02/18 1011 4     Pain Loc --      Pain Edu? --      Excl. in GC? --    No data found.  Updated Vital Signs BP (!) 143/95 (BP Location: Left Arm)   Pulse 77   Temp 98.6 F (37 C) (Oral)   Resp 16   SpO2 100%   Physical Exam Constitutional:      General: She is not in acute distress.    Appearance: She is well-developed. She is not ill-appearing, toxic-appearing or diaphoretic.  HENT:     Head: Normocephalic and atraumatic.     Right Ear: Tympanic membrane, ear canal and external ear normal. Tympanic membrane is not erythematous or bulging.     Left Ear: Tympanic membrane, ear canal and external ear normal. Tympanic membrane is not erythematous or bulging.     Nose: Nose normal.     Right Sinus: No maxillary sinus tenderness or frontal sinus tenderness.     Left Sinus: No maxillary sinus tenderness or frontal sinus tenderness.     Mouth/Throat:     Mouth: Mucous membranes are moist.     Pharynx: Oropharynx is clear. Uvula midline.  Eyes:     Conjunctiva/sclera: Conjunctivae normal.     Pupils: Pupils are equal, round, and reactive to light.  Neck:     Musculoskeletal: Normal range of motion and neck supple.  Cardiovascular:     Rate and Rhythm: Normal rate and regular rhythm.     Heart sounds: Normal heart  sounds. No murmur. No friction rub. No gallop.   Pulmonary:     Effort: Pulmonary effort is normal.     Breath sounds: Normal breath sounds. No decreased breath sounds, wheezing, rhonchi or rales.  Lymphadenopathy:     Cervical: No cervical adenopathy.  Skin:    General: Skin is warm and dry.  Neurological:     Mental Status: She is alert and oriented to person, place, and time.  Psychiatric:        Behavior: Behavior normal.        Judgment: Judgment normal.      UC Treatments / Results  Labs (all labs ordered are listed, but only abnormal results are displayed) Labs Reviewed - No data to display  EKG None  Radiology No results found.  Procedures Procedures (including critical care time)  Medications Ordered in UC Medications - No data to display  Initial Impression / Assessment and Plan / UC Course  I have reviewed the triage vital signs and the nursing notes.  Pertinent labs & imaging results that were available during my care of the patient were reviewed by me and considered in my medical decision making (see chart for details).    Discussed with patient history and exam most consistent with viral URI. Symptomatic treatment as needed. Push fluids. Return precautions given.   Final Clinical Impressions(s) / UC Diagnoses   Final diagnoses:  Viral syndrome    ED Prescriptions    Medication Sig Dispense Auth. Provider   ipratropium (ATROVENT) 0.06 % nasal spray Place 2 sprays into both nostrils 4 (four) times daily. 15 mL Fenris Cauble V, PA-C   predniSONE (DELTASONE) 20 MG tablet Take 2 tablets (40 mg total) by mouth daily for 5 days. 10 tablet Threasa Alpha, PA-C 10/02/18 1033    Ann Headland V, PA-C 10/02/18 1034

## 2018-10-02 NOTE — Discharge Instructions (Signed)
Prednisone as directed. Start over the counter mucinex as directed. Continue flonase, start atrovent nasal spray for nasal congestion/drainage. You can use over the counter nasal saline rinse such as neti pot for nasal congestion. Keep hydrated, your urine should be clear to pale yellow in color. Tylenol/motrin for fever and pain. Monitor for any worsening of symptoms, chest pain, shortness of breath, wheezing, swelling of the throat, follow up for reevaluation.   For sore throat/cough try using a honey-based tea. Use 3 teaspoons of honey with juice squeezed from half lemon. Place shaved pieces of ginger into 1/2-1 cup of water and warm over stove top. Then mix the ingredients and repeat every 4 hours as needed.

## 2018-10-05 DIAGNOSIS — F3181 Bipolar II disorder: Secondary | ICD-10-CM | POA: Diagnosis not present

## 2018-10-12 DIAGNOSIS — F3181 Bipolar II disorder: Secondary | ICD-10-CM | POA: Diagnosis not present

## 2018-10-19 DIAGNOSIS — F3181 Bipolar II disorder: Secondary | ICD-10-CM | POA: Diagnosis not present

## 2018-11-05 DIAGNOSIS — F3181 Bipolar II disorder: Secondary | ICD-10-CM | POA: Diagnosis not present

## 2018-11-12 DIAGNOSIS — F3181 Bipolar II disorder: Secondary | ICD-10-CM | POA: Diagnosis not present

## 2018-11-21 ENCOUNTER — Encounter: Payer: Self-pay | Admitting: Student in an Organized Health Care Education/Training Program

## 2018-11-26 ENCOUNTER — Other Ambulatory Visit: Payer: Self-pay | Admitting: *Deleted

## 2018-11-26 DIAGNOSIS — J301 Allergic rhinitis due to pollen: Secondary | ICD-10-CM

## 2018-11-26 DIAGNOSIS — G43009 Migraine without aura, not intractable, without status migrainosus: Secondary | ICD-10-CM

## 2018-11-26 DIAGNOSIS — I10 Essential (primary) hypertension: Secondary | ICD-10-CM

## 2018-11-26 DIAGNOSIS — K5900 Constipation, unspecified: Secondary | ICD-10-CM

## 2018-11-27 DIAGNOSIS — F3181 Bipolar II disorder: Secondary | ICD-10-CM | POA: Diagnosis not present

## 2018-11-28 MED ORDER — FLUTICASONE PROPIONATE 50 MCG/ACT NA SUSP: 2.0000 | Freq: Every day | NASAL | 6 refills | Status: DC

## 2018-11-28 MED ORDER — HYDROCHLOROTHIAZIDE 12.5 MG PO CAPS: 12.5000 mg | ORAL_CAPSULE | Freq: Every day | ORAL | 3 refills | Status: DC

## 2018-11-28 MED ORDER — SUMATRIPTAN SUCCINATE 50 MG PO TABS: ORAL_TABLET | ORAL | 5 refills | Status: DC

## 2018-12-04 DIAGNOSIS — F3181 Bipolar II disorder: Secondary | ICD-10-CM | POA: Diagnosis not present

## 2018-12-05 ENCOUNTER — Other Ambulatory Visit: Payer: Self-pay | Admitting: *Deleted

## 2018-12-05 DIAGNOSIS — K5900 Constipation, unspecified: Secondary | ICD-10-CM

## 2018-12-06 ENCOUNTER — Encounter: Payer: Self-pay | Admitting: Student in an Organized Health Care Education/Training Program

## 2018-12-06 MED ORDER — DOCUSATE SODIUM 100 MG PO CAPS: 100.0000 mg | ORAL_CAPSULE | Freq: Two times a day (BID) | ORAL | 3 refills | Status: DC

## 2018-12-11 DIAGNOSIS — F3181 Bipolar II disorder: Secondary | ICD-10-CM | POA: Diagnosis not present

## 2018-12-12 ENCOUNTER — Other Ambulatory Visit: Payer: Self-pay | Admitting: Student in an Organized Health Care Education/Training Program

## 2018-12-26 DIAGNOSIS — F3181 Bipolar II disorder: Secondary | ICD-10-CM | POA: Diagnosis not present

## 2019-01-08 DIAGNOSIS — F3181 Bipolar II disorder: Secondary | ICD-10-CM | POA: Diagnosis not present

## 2019-01-15 DIAGNOSIS — F3181 Bipolar II disorder: Secondary | ICD-10-CM | POA: Diagnosis not present

## 2019-01-22 DIAGNOSIS — F3181 Bipolar II disorder: Secondary | ICD-10-CM | POA: Diagnosis not present

## 2019-01-29 DIAGNOSIS — F3181 Bipolar II disorder: Secondary | ICD-10-CM | POA: Diagnosis not present

## 2019-02-12 ENCOUNTER — Ambulatory Visit (INDEPENDENT_AMBULATORY_CARE_PROVIDER_SITE_OTHER): Payer: Medicaid Other | Admitting: Family Medicine

## 2019-02-12 ENCOUNTER — Other Ambulatory Visit: Payer: Self-pay

## 2019-02-12 ENCOUNTER — Encounter: Payer: Self-pay | Admitting: Family Medicine

## 2019-02-12 VITALS — BP 142/82 | HR 86

## 2019-02-12 DIAGNOSIS — R300 Dysuria: Secondary | ICD-10-CM

## 2019-02-12 DIAGNOSIS — G479 Sleep disorder, unspecified: Secondary | ICD-10-CM

## 2019-02-12 LAB — POCT URINALYSIS DIP (MANUAL ENTRY)
Bilirubin, UA: NEGATIVE
Blood, UA: NEGATIVE
Glucose, UA: NEGATIVE mg/dL
Ketones, POC UA: NEGATIVE mg/dL
Leukocytes, UA: NEGATIVE
Nitrite, UA: NEGATIVE
Protein Ur, POC: NEGATIVE mg/dL
Spec Grav, UA: 1.015 (ref 1.010–1.025)
Urobilinogen, UA: 0.2 E.U./dL
pH, UA: 6 (ref 5.0–8.0)

## 2019-02-12 MED ORDER — HYDROXYZINE HCL 10 MG PO TABS: 10.0000 mg | ORAL_TABLET | Freq: Three times a day (TID) | ORAL | 0 refills | Status: DC | PRN

## 2019-02-12 NOTE — Progress Notes (Signed)
   Subjective:   Patient ID: [name]    DOB: 1976/03/03, 43 y.o. female   MRN: [id number]  CC: dysuria   HPI: [name] is a 43 y.o. female who presents to clinic today for the following issue.  Dysuria Symptoms started yesterday when she noticed some burning and frequency, no urgency. Has had UTIs in the past but not recently. She denies flank pain and back pain. LMP 01/24/2019.  No fevers, chills, nausea or vomiting.  She endorses slight vaginal discharge that is clear in color and no odor.  No itching or irritation.  Not sexually active so she is not concerned about STDs and declines testing.      Difficulty sleeping Reports this has been longstanding issue due to anxiety. She denies any specific stressors at this time.   Her therapist recommended asking her doctor about starting her on something for this.  She has tried melatonin and muscle relaxants without much help.  Gets about 2-3 hours of sleep a night. Usually goes to bed around 3AM and intermittently wakes up until about 8AM.  Uses electronics prior to bedtime and watches tv.   ROS: See HPI for pertinent ROS.  [place]: Pertinent past medical, surgical, family, and social history were reviewed and updated as appropriate. Smoking status reviewed.  Medications reviewed. Objective:   BP (!) 142/82   Pulse 86   LMP 01/23/2019 (Approximate)   SpO2 99%  Vitals and nursing note reviewed.  General: 43 yo female, NAD  Neck: supple CV: RRR no MRG  Lungs: normal effort  Abdomen: soft, NTND, +bs  Skin: warm, dry Extremities: warm, well perfused Psych: normal mood and affect   Assessment & Plan:   Difficulty sleeping Likely 2/2 poor sleep hygiene, although underlying anxiety could certainly contribute, no identifiable stressors per our discussion.  Will trial hydroxyzine at bedtime.  Additionally, discussed relaxation techniques and sleep hygiene - taking a warm bath prior to bedtime, avoiding use of  electronics,  caffeine and alcohol intake prior to bedtime. Advised to sleep in a dark quiet room and setting a bedtime.   Dysuria Clinically c/w likely UTI and UA collected however no leukocytes or nitrite to suggest infection.  Declined wet prep and STD testing as she is not sexually active. Discussed with her to follow up if symptoms persist.    Orders Placed This Encounter  Procedures  . POCT urinalysis dipstick   Meds ordered this encounter  Medications  . hydrOXYzine (ATARAX/VISTARIL) 10 MG tablet    Sig: Take 1 tablet (10 mg total) by mouth 3 (three) times daily as needed.    Dispense:  30 tablet    Refill:  0    Lovenia Kim, MD [place] PGY-3 02/14/2019 8:33 PM

## 2019-02-13 DIAGNOSIS — F3181 Bipolar II disorder: Secondary | ICD-10-CM | POA: Diagnosis not present

## 2019-02-14 DIAGNOSIS — Z72820 Sleep deprivation: Secondary | ICD-10-CM | POA: Insufficient documentation

## 2019-02-14 DIAGNOSIS — G479 Sleep disorder, unspecified: Secondary | ICD-10-CM | POA: Insufficient documentation

## 2019-02-14 NOTE — Assessment & Plan Note (Addendum)
Likely 2/2 poor sleep hygiene, although underlying anxiety could certainly contribute, no identifiable stressors per our discussion.  Will trial hydroxyzine at bedtime.  Additionally, discussed relaxation techniques and sleep hygiene - taking a warm bath prior to bedtime, avoiding use of  electronics, caffeine and alcohol intake prior to bedtime. Advised to sleep in a dark quiet room and setting a bedtime.

## 2019-02-20 DIAGNOSIS — F3181 Bipolar II disorder: Secondary | ICD-10-CM | POA: Diagnosis not present

## 2019-02-27 DIAGNOSIS — F3181 Bipolar II disorder: Secondary | ICD-10-CM | POA: Diagnosis not present

## 2019-03-05 DIAGNOSIS — F3181 Bipolar II disorder: Secondary | ICD-10-CM | POA: Diagnosis not present

## 2019-03-13 DIAGNOSIS — F3181 Bipolar II disorder: Secondary | ICD-10-CM | POA: Diagnosis not present

## 2019-03-20 DIAGNOSIS — F3181 Bipolar II disorder: Secondary | ICD-10-CM | POA: Diagnosis not present

## 2019-03-27 DIAGNOSIS — F3181 Bipolar II disorder: Secondary | ICD-10-CM | POA: Diagnosis not present

## 2019-03-28 ENCOUNTER — Other Ambulatory Visit: Payer: Self-pay

## 2019-03-28 DIAGNOSIS — K5900 Constipation, unspecified: Secondary | ICD-10-CM

## 2019-03-28 DIAGNOSIS — J301 Allergic rhinitis due to pollen: Secondary | ICD-10-CM

## 2019-03-28 MED ORDER — LORATADINE 10 MG PO TABS: 10.0000 mg | ORAL_TABLET | Freq: Every day | ORAL | 1 refills | Status: DC

## 2019-03-28 MED ORDER — POLYETHYLENE GLYCOL 3350 17 G PO PACK: 17.0000 g | PACK | Freq: Three times a day (TID) | ORAL | 2 refills | Status: DC

## 2019-04-04 DIAGNOSIS — F3181 Bipolar II disorder: Secondary | ICD-10-CM | POA: Diagnosis not present

## 2019-04-05 ENCOUNTER — Other Ambulatory Visit: Payer: Self-pay

## 2019-04-05 MED ORDER — EPINEPHRINE 0.3 MG/0.3ML IJ SOAJ: 0.3000 mg | INTRAMUSCULAR | 0 refills | Status: DC | PRN

## 2019-04-09 ENCOUNTER — Ambulatory Visit (INDEPENDENT_AMBULATORY_CARE_PROVIDER_SITE_OTHER)
Admission: RE | Admit: 2019-04-09 | Discharge: 2019-04-09 | Disposition: A | Discharge status: Home / Self Care | Payer: Medicaid Other | Source: Ambulatory Visit

## 2019-04-09 DIAGNOSIS — H60391 Other infective otitis externa, right ear: Secondary | ICD-10-CM | POA: Diagnosis not present

## 2019-04-09 MED ORDER — NEOMYCIN-POLYMYXIN-HC 3.5-10000-1 OT SUSP: 4.0000 [drp] | Freq: Three times a day (TID) | OTIC | 0 refills | Status: AC

## 2019-04-09 NOTE — ED Provider Notes (Signed)
Virtual Visit via Video Note:  [name]  initiated request for Telemedicine visit with [name]. I connected with [name]  on 04/09/2019 at 1:58 PM  for a synchronized telemedicine visit using a video enabled HIPPA compliant telemedicine application. I verified that I am speaking with [name]  using two identifiers. Ann Bellew Jodell Cipro, PA-C  was physically located in a [facility] Urgent care site and [name] was located at a different location.   The limitations of evaluation and management by telemedicine as well as the availability of in-person appointments were discussed. Patient was informed that she  may incur a bill ( including co-pay) for this virtual visit encounter. [name]  expressed understanding and gave verbal consent to proceed with virtual visit.     History of Present Illness:[name]  is a 43 y.o. female presents with 1 week history of right ear pain, drainage.  Denies changes in hearing.  Denies URI symptoms such as cough, congestion, sore throat.  Denies fever, chills, night sweats.  She has had frequent episodes of otitis externa, and this feels similar.  States has been using cotton swab, hydrogen peroxide without relief.  Past Medical History:  Diagnosis Date  . Hypertension   . Migraine   . Vertigo     Allergies  Allergen Reactions  . Aripiprazole     REACTION: Disoriented.  . Escitalopram Oxalate     REACTION: disoriented.  . Latex Itching        Observations/Objective: General: Well appearing, nontoxic, no acute distress. Sitting comfortably. Head: Normocephalic, atraumatic Eye: No conjunctival injection, eyelid swelling. EOMI ENT: Mucus membranes moist, no lip cracking. No obvious nasal drainage. Patient states tender to palpation of right tragus. No tenderness to left tragus. No tenderness to self palpation of the mastoid. Pulm: Speaking in full sentences without difficulty. Normal effort.  No respiratory distress, accessory muscle use. Neuro: Normal mental status. Alert and oriented x 3.   Assessment and Plan: We will start Cortisporin as directed.  Discussed other symptomatic treatment, and to avoid cotton swab use.  Discussed with patient if symptoms not improving, will need in person evaluation for ear exam.  At this time, lower suspicion for otitis media, and will have patient continue to monitor.  Return precautions given.  Patient expresses understanding and agrees to plan.  Follow Up Instructions:    I discussed the assessment and treatment plan with the patient. The patient was provided an opportunity to ask questions and all were answered. The patient agreed with the plan and demonstrated an understanding of the instructions.   The patient was advised to call back or seek an in-person evaluation if the symptoms worsen or if the condition fails to improve as anticipated.  I provided 15 minutes of non-face-to-face time during this encounter.    Ok Edwards, PA-C  04/09/2019 1:58 PM         Ok Edwards, PA-C 04/09/19 1434

## 2019-04-09 NOTE — Discharge Instructions (Signed)
Start cortisporin as directed. If developing pain to the mastoid with redness, swelling, go to the ED for further evaluation. Otherwise, follow up in person if symptoms not improving.

## 2019-04-20 ENCOUNTER — Ambulatory Visit (HOSPITAL_COMMUNITY)
Admission: EM | Admit: 2019-04-20 | Discharge: 2019-04-20 | Disposition: A | Discharge status: Home / Self Care | Payer: Medicaid Other | Attending: Family Medicine | Admitting: Family Medicine

## 2019-04-20 ENCOUNTER — Encounter (HOSPITAL_COMMUNITY): Payer: Self-pay

## 2019-04-20 ENCOUNTER — Other Ambulatory Visit: Payer: Self-pay

## 2019-04-20 DIAGNOSIS — H66003 Acute suppurative otitis media without spontaneous rupture of ear drum, bilateral: Secondary | ICD-10-CM

## 2019-04-20 MED ORDER — AMOXICILLIN-POT CLAVULANATE 875-125 MG PO TABS: 1.0000 | ORAL_TABLET | Freq: Two times a day (BID) | ORAL | 0 refills | Status: DC

## 2019-04-20 NOTE — ED Provider Notes (Signed)
[facility]   [id number] 04/20/19 Arrival Time: 1132  ASSESSMENT & PLAN:  1. Non-recurrent acute suppurative otitis media of both ears without spontaneous rupture of tympanic membranes     To begin: Meds ordered this encounter  Medications  . amoxicillin-clavulanate (AUGMENTIN) 875-125 MG tablet    Sig: Take 1 tablet by mouth every 12 (twelve) hours.    Dispense:  20 tablet    Refill:  0    OTC symptom care as needed.  Follow-up Information    Dareen Pianonderson, Chelsey L, DO.   Specialty: Family Medicine Why: As needed. Contact information: [address] [place] [address] [phone]        Schedule an appointment as soon as possible for a visit  with [facility].   Contact information: [address] [place] [address] [phone]           Reviewed expectations re: course of current medical issues. Questions answered. Outlined signs and symptoms indicating need for more acute intervention. Patient verbalized understanding. After Visit Summary given.   SUBJECTIVE: History from: patient. Telemedicine visit dated 04/09/2019 reviewed.  Linward Headlandameka C Faux is a 43 y.o. female who presents with complaint of bilateral otalgia; with clear/white drainage; without bleeding. Onset gradual, a week ago. Recent cold symptoms: minimal. Fever: no. Overall normal PO intake without n/v. Sick/COVID-19 contacts: no. OTC treatment: analgesics without much relief. Rx ear drop not helping.  Social History   Tobacco Use  Smoking Status Current Every Day Smoker  . Packs/day: 0.50  . Years: 19.00  . Pack years: 9.50  . Types: Cigarettes  Smokeless Tobacco Never Used    ROS: As per HPI. All other systems negative.    OBJECTIVE:  Vitals:   04/20/19 1200  BP: 124/89  Pulse: 86  Resp: 16  Temp: 98.5 F (36.9 C)  TempSrc: Oral  SpO2: 99%    General appearance: alert; appears fatigued Ear Canal: normal  TM: bilateral: erythematous, bulging; no bleeding; no active ear drainage Neck: supple without LAD CV; RRR Lungs: unlabored respirations, symmetrical air entry; cough: absent; no respiratory distress Skin: warm and dry Psychological: alert and cooperative; normal mood and affect  Allergies  Allergen Reactions  . Aripiprazole     REACTION: Disoriented.  . Escitalopram Oxalate     REACTION: disoriented.  . Latex Itching    Past Medical History:  Diagnosis Date  . Hypertension   . Migraine   . Vertigo    Family History  Problem Relation Age of Onset  . Hypertension Mother   . Diabetes Mother    Social History   Socioeconomic History  . Marital status: Single    Spouse name: Not on file  . Number of children: Not on file  . Years of education: Not on file  . Highest education level: Not on file  Occupational History  . Not on file  Social Needs  . Financial resource strain: Not on file  . Food insecurity    Worry: Not on file    Inability: Not on file  . Transportation needs    Medical: Not on file    Non-medical: Not on file  Tobacco Use  . Smoking status: Current Every Day Smoker    Packs/day: 0.50    Years: 19.00    Pack years: 9.50    Types: Cigarettes  . Smokeless tobacco: Never Used  Substance and Sexual Activity  . Alcohol use: No  . Drug use:  No  . Sexual activity: Yes    Birth control/protection: None  Lifestyle  . Physical activity    Days per week: Not on file    Minutes per session: Not on file  . Stress: Not on file  Relationships  . Social Herbalist on phone: Not on file    Gets together: Not on file    Attends religious service: Not on file    Active member of club or organization: Not on file    Attends meetings of clubs or organizations: Not on file    Relationship status: Not on file  . Intimate partner violence    Fear of current or ex partner: Not on file    Emotionally abused: Not on file    Physically abused: Not  on file    Forced sexual activity: Not on file  Other Topics Concern  . Not on file  Social History Narrative  . Not on file            Vanessa Kick, MD 04/20/19 1213

## 2019-04-20 NOTE — ED Triage Notes (Signed)
Pt present both ears are draining and clogged up.symptoms started over a week ago. Pt was prescribed some ear drops and she stated it has gotten worst.

## 2019-04-26 DIAGNOSIS — F3181 Bipolar II disorder: Secondary | ICD-10-CM | POA: Diagnosis not present

## 2019-05-16 DIAGNOSIS — F3181 Bipolar II disorder: Secondary | ICD-10-CM | POA: Diagnosis not present

## 2019-05-23 DIAGNOSIS — H66003 Acute suppurative otitis media without spontaneous rupture of ear drum, bilateral: Secondary | ICD-10-CM | POA: Diagnosis not present

## 2019-05-23 DIAGNOSIS — F3181 Bipolar II disorder: Secondary | ICD-10-CM | POA: Diagnosis not present

## 2019-05-30 DIAGNOSIS — F3181 Bipolar II disorder: Secondary | ICD-10-CM | POA: Diagnosis not present

## 2019-05-30 NOTE — ED Provider Notes (Signed)
[place]    CSN: [id number] Arrival date & time: 08/24/18  1423      History   Chief Complaint Chief Complaint  Patient presents with  . Otalgia    HPI [name] is a 43 y.o. female.   Pt c/o right ear pain.  Reports decreased hearing.  Denies fevers     Past Medical History:  Diagnosis Date  . Hypertension   . Migraine   . Vertigo     Patient Active Problem List   Diagnosis Date Noted  . Difficulty sleeping 02/14/2019  . Bipolar 2 disorder ([place]) 03/25/2016  . Essential hypertension, benign 09/27/2013  . GERD (gastroesophageal reflux disease) 05/02/2013  . Chronic lumbar pain 10/02/2012  . Migraine without aura 07/05/2012  . Anxiety state 08/18/2010  . TOBACCO ABUSE 08/18/2010  . OBESITY NOS 01/26/2007  . ALLERGIC RHINITIS, SEASONAL 01/26/2007    History reviewed. No pertinent surgical history.  OB History   No obstetric history on file.      Home Medications    Prior to Admission medications   Medication Sig Start Date End Date Taking? Authorizing Provider  montelukast (SINGULAIR) 10 MG tablet Take 1 tablet (10 mg total) by mouth at bedtime. 11/02/17  Yes Mikell, Jeani Sow, MD  Oxcarbazepine (TRILEPTAL) 300 MG tablet Take 300 mg by mouth 2 (two) times daily.   Yes [provider]  propranolol (INDERAL) 20 MG tablet Take 1 tablet (20 mg total) by mouth 3 (three) times daily. 01/04/18  Yes Mikell, Jeani Sow, MD  tiZANidine (ZANAFLEX) 4 MG capsule Take 1 capsule (4 mg total) by mouth 2 (two) times daily as needed (for migraine, NOT BEFORE DRIVING). Do not take with flexeril 05/31/18  Yes Everrett Coombe, MD  amoxicillin-clavulanate (AUGMENTIN) 875-125 MG tablet Take 1 tablet by mouth every 12 (twelve) hours. 04/20/19   Vanessa Kick, MD  docusate sodium (COLACE) 100 MG capsule Take 1 capsule (100 mg total) by mouth 2 (two) times daily. 12/06/18   Anderson, Chelsey L, DO  EPINEPHrine 0.3 mg/0.3 mL IJ SOAJ injection Inject 0.3  mLs (0.3 mg total) into the muscle as needed for anaphylaxis. 04/05/19   Martyn Malay, MD  fluticasone (FLONASE) 50 MCG/ACT nasal spray Place 2 sprays into both nostrils daily. 11/28/18   Anderson, Chelsey L, DO  glycerin, Pediatric, 1.2 g SUPP Place 1 suppository (1.2 g total) rectally daily as needed for moderate constipation. 12/12/16   McKeag, Marylynn Pearson, MD  hydrochlorothiazide (MICROZIDE) 12.5 MG capsule Take 1 capsule (12.5 mg total) by mouth daily. 11/28/18   Anderson, Chelsey L, DO  hydrOXYzine (ATARAX/VISTARIL) 10 MG tablet Take 1 tablet (10 mg total) by mouth 3 (three) times daily as needed. 02/12/19   Lovenia Kim, MD  ipratropium (ATROVENT) 0.06 % nasal spray Place 2 sprays into both nostrils 4 (four) times daily. 10/02/18   Tasia Catchings, Amy V, PA-C  loratadine (CLARITIN) 10 MG tablet Take 1 tablet (10 mg total) by mouth daily. 03/28/19   Anderson, Chelsey L, DO  meclizine (ANTIVERT) 12.5 MG tablet Take 1 tablet (12.5 mg total) by mouth 3 (three) times daily as needed for dizziness. 03/16/15   Leone Brand, MD  omeprazole (PRILOSEC) 20 MG capsule TAKE 1 CAPSULE BY MOUTH EVERY DAY 12/12/18   Ouida Sills, Chelsey L, DO  ondansetron (ZOFRAN) 4 MG tablet Take 1 tablet (4 mg total) by mouth every 8 (eight) hours as needed for nausea or vomiting. 05/31/18   Everrett Coombe, MD  PATADAY 0.2 %  SOLN APPLY 1 DROP TO EYE 2 (TWO) TIMES DAILY. 02/05/18   Mikell, Antionette Poles, MD  polyethylene glycol (MIRALAX / GLYCOLAX) 17 g packet Take 17 g by mouth 3 (three) times daily. Until stooling regularly 03/28/19   Anderson, Chelsey L, DO  SUMAtriptan (IMITREX) 50 MG tablet TAKE 1 TABLET BY MOUTH FOR HEADACHE. MAY REPEAT IN 2 HOURS IF HEADACHE PERSIST 11/28/18   Leeroy Bock, DO    Family History Family History  Problem Relation Age of Onset  . Hypertension Mother   . Diabetes Mother     Social History Social History   Tobacco Use  . Smoking status: Current Every Day Smoker    Packs/day: 0.50    Years: 19.00    Pack  years: 9.50    Types: Cigarettes  . Smokeless tobacco: Never Used  Substance Use Topics  . Alcohol use: No  . Drug use: No     Allergies   Aripiprazole, Escitalopram oxalate, and Latex   Review of Systems Review of Systems  Constitutional: Negative for chills and fever.  HENT: Positive for ear pain. Negative for sore throat and tinnitus.   Eyes: Negative for redness.  Respiratory: Negative for cough and shortness of breath.   Cardiovascular: Negative for chest pain and palpitations.  Gastrointestinal: Negative for abdominal pain, diarrhea, nausea and vomiting.  Genitourinary: Negative for dysuria, frequency and urgency.  Musculoskeletal: Negative for myalgias.  Skin: Negative for rash.       No lesions  Neurological: Negative for weakness.  Hematological: Does not bruise/bleed easily.  Psychiatric/Behavioral: Negative for suicidal ideas.     Physical Exam Triage Vital Signs ED Triage Vitals  Enc Vitals Group     BP 08/24/18 1441 (!) 157/94     Pulse Rate 08/24/18 1441 (!) 103     Resp 08/24/18 1441 18     Temp 08/24/18 1441 99.2 F (37.3 C)     Temp Source 08/24/18 1441 Oral     SpO2 08/24/18 1441 100 %     Weight --      Height --      Head Circumference --      Peak Flow --      Pain Score 08/24/18 1442 6     Pain Loc --      Pain Edu? --      Excl. in GC? --    No data found.  Updated Vital Signs BP (!) 157/94 (BP Location: Right Arm)   Pulse (!) 103   Temp 99.2 F (37.3 C) (Oral)   Resp 18   SpO2 100%   Visual Acuity Right Eye Distance:   Left Eye Distance:   Bilateral Distance:    Right Eye Near:   Left Eye Near:    Bilateral Near:     Physical Exam Vitals signs and nursing note reviewed.  Constitutional:      General: She is not in acute distress.    Appearance: She is well-developed.  HENT:     Head: Normocephalic and atraumatic.     Right Ear: Tympanic membrane is erythematous.  Eyes:     General: No scleral icterus.     Conjunctiva/sclera: Conjunctivae normal.     Pupils: Pupils are equal, round, and reactive to light.  Neck:     Musculoskeletal: Normal range of motion and neck supple.     Thyroid: No thyromegaly.     Vascular: No JVD.     Trachea: No tracheal deviation.  Cardiovascular:  Rate and Rhythm: Normal rate and regular rhythm.     Heart sounds: Normal heart sounds. No murmur. No friction rub. No gallop.   Pulmonary:     Effort: Pulmonary effort is normal.     Breath sounds: Normal breath sounds.  Abdominal:     General: Bowel sounds are normal. There is no distension.     Palpations: Abdomen is soft.     Tenderness: There is no abdominal tenderness.  Musculoskeletal: Normal range of motion.  Lymphadenopathy:     Cervical: No cervical adenopathy.  Skin:    General: Skin is warm and dry.  Neurological:     Mental Status: She is alert and oriented to person, place, and time.     Cranial Nerves: No cranial nerve deficit.  Psychiatric:        Behavior: Behavior normal.        Thought Content: Thought content normal.        Judgment: Judgment normal.      UC Treatments / Results  Labs (all labs ordered are listed, but only abnormal results are displayed) Labs Reviewed - No data to display  EKG   Radiology No results found.  Procedures Procedures (including critical care time)  Medications Ordered in UC Medications - No data to display  Initial Impression / Assessment and Plan / UC Course  I have reviewed the triage vital signs and the nursing notes.  Pertinent labs & imaging results that were available during my care of the patient were reviewed by me and considered in my medical decision making (see chart for details).     Recent treatment for AOM.  Will expand coverage with ceph.  Final Clinical Impressions(s) / UC Diagnoses   Final diagnoses:  Recurrent acute serous otitis media of right ear   Discharge Instructions   None    ED Prescriptions     Medication Sig Dispense Auth. Provider   cefdinir (OMNICEF) 300 MG capsule Take 1 capsule (300 mg total) by mouth 2 (two) times daily for 10 days. 20 capsule Arnaldo Nataliamond, Nyan Dufresne S, MD   fluconazole (DIFLUCAN) 150 MG tablet Take 1 tablet (150 mg total) by mouth daily. Take at the end of the course of Cefdinir 1 tablet Arnaldo Nataliamond, Marleena Shubert S, MD   predniSONE (DELTASONE) 10 MG tablet Take 2 tablets (20 mg total) by mouth daily for 3 days. 6 tablet Arnaldo Nataliamond, Joffrey Kerce S, MD     PDMP not reviewed this encounter.   Arnaldo Nataliamond, Man Bonneau S, MD 05/30/19 [phone]

## 2019-06-06 DIAGNOSIS — F3181 Bipolar II disorder: Secondary | ICD-10-CM | POA: Diagnosis not present

## 2019-06-14 DIAGNOSIS — F3181 Bipolar II disorder: Secondary | ICD-10-CM | POA: Diagnosis not present

## 2019-06-20 DIAGNOSIS — F3181 Bipolar II disorder: Secondary | ICD-10-CM | POA: Diagnosis not present

## 2019-06-21 DIAGNOSIS — F3181 Bipolar II disorder: Secondary | ICD-10-CM | POA: Diagnosis not present

## 2019-06-28 DIAGNOSIS — F3181 Bipolar II disorder: Secondary | ICD-10-CM | POA: Diagnosis not present

## 2019-07-04 DIAGNOSIS — F3181 Bipolar II disorder: Secondary | ICD-10-CM | POA: Diagnosis not present

## 2019-07-05 DIAGNOSIS — F3181 Bipolar II disorder: Secondary | ICD-10-CM | POA: Diagnosis not present

## 2019-07-17 DIAGNOSIS — F3181 Bipolar II disorder: Secondary | ICD-10-CM | POA: Diagnosis not present

## 2019-07-25 DIAGNOSIS — F3181 Bipolar II disorder: Secondary | ICD-10-CM | POA: Diagnosis not present

## 2019-08-02 ENCOUNTER — Ambulatory Visit (INDEPENDENT_AMBULATORY_CARE_PROVIDER_SITE_OTHER): Payer: Medicaid Other | Admitting: Student in an Organized Health Care Education/Training Program

## 2019-08-02 ENCOUNTER — Other Ambulatory Visit: Payer: Self-pay

## 2019-08-02 VITALS — BP 127/80 | HR 89 | Wt 253.4 lb

## 2019-08-02 DIAGNOSIS — K219 Gastro-esophageal reflux disease without esophagitis: Secondary | ICD-10-CM

## 2019-08-02 DIAGNOSIS — I10 Essential (primary) hypertension: Secondary | ICD-10-CM

## 2019-08-02 DIAGNOSIS — N898 Other specified noninflammatory disorders of vagina: Secondary | ICD-10-CM | POA: Insufficient documentation

## 2019-08-02 DIAGNOSIS — Z23 Encounter for immunization: Secondary | ICD-10-CM

## 2019-08-02 LAB — POCT WET PREP (WET MOUNT)
Clue Cells Wet Prep Whiff POC: NEGATIVE
Trichomonas Wet Prep HPF POC: ABSENT

## 2019-08-02 MED ORDER — HYDROCHLOROTHIAZIDE 12.5 MG PO CAPS: 12.5000 mg | ORAL_CAPSULE | Freq: Every day | ORAL | 3 refills | Status: DC

## 2019-08-02 MED ORDER — OMEPRAZOLE 20 MG PO CPDR: DELAYED_RELEASE_CAPSULE | ORAL | 0 refills | Status: DC

## 2019-08-02 MED ORDER — FLUCONAZOLE 150 MG PO TABS: 150.0000 mg | ORAL_TABLET | Freq: Once | ORAL | 0 refills | Status: AC

## 2019-08-02 MED ORDER — SUCRALFATE 1 GM/10ML PO SUSP: 1.0000 g | Freq: Three times a day (TID) | ORAL | 0 refills | Status: DC

## 2019-08-02 NOTE — Assessment & Plan Note (Signed)
Refilled omeprazole, prescribe sucralfate

## 2019-08-02 NOTE — Assessment & Plan Note (Addendum)
Takes BP med as directed daily. Well controlled today Refill today CMP, lipid panel, TSH

## 2019-08-02 NOTE — Assessment & Plan Note (Signed)
1211

## 2019-08-02 NOTE — Assessment & Plan Note (Signed)
Concern for yeast infection. Wet prep done today. Will fill diflucan as needed. No concerns for STDs or pregnancy today.  Declines STD screening including HIV

## 2019-08-02 NOTE — Patient Instructions (Addendum)
It was a pleasure to see you today!  To summarize our discussion for this visit:  We are testing for yeast infection today and I will send in a treatment as needed.   I am refilling your heartburn medicine as well as another medicine that can help relieve the pain.   I am happy you decided to get your flu shot today!  Some additional health maintenance measures we should update are: Health Maintenance Due  Topic Date Due  . HIV Screening  05/08/1992  . INFLUENZA VACCINE  03/23/2019  .   Please return to our clinic to see me as needed.  Call the clinic at [phone] if your symptoms worsen or you have any concerns.   Thank you for allowing me to take part in your care,  Dr. Doristine Mango   Mammogram A mammogram is a low energy X-ray of the breasts that is done to check for abnormal changes. This procedure can screen for and detect any changes that may indicate breast cancer. Mammograms are regularly done on women. A man may have a mammogram if he has a lump or swelling in his breast. A mammogram can also identify other changes and variations in the breast, such as:  Inflammation of the breast tissue (mastitis).  An infected area that contains a collection of pus (abscess).  A fluid-filled sac (cyst).  Fibrocystic changes. This is when breast tissue becomes denser, which can make the tissue feel rope-like or uneven under the skin.  Tumors that are not cancerous (benign). Tell a health care provider:  About any allergies you have.  If you have breast implants.  If you have had previous breast disease, biopsy, or surgery.  If you are breastfeeding.  If you are younger than age 85.  If you have a family history of breast cancer.  Whether you are pregnant or may be pregnant. What are the risks? Generally, this is a safe procedure. However, problems may occur, including:  Exposure to radiation. Radiation levels are very low with this test.  The results being  misinterpreted.  The need for further tests.  The inability of the mammogram to detect certain cancers. What happens before the procedure?  Schedule your test about 1-2 weeks after your menstrual period if you are still menstruating. This is usually when your breasts are the least tender.  If you have had a mammogram done at a different facility in the past, get the mammogram X-rays or have them sent to your current exam facility. The new and old images will be compared.  Wash your breasts and underarms on the day of the test.  Do not wear deodorants, perfumes, lotions, or powders anywhere on your body on the day of the test.  Remove any jewelry from your neck.  Wear clothes that you can change into and out of easily. What happens during the procedure?   You will undress from the waist up and put on a gown that opens in the front.  You will stand in front of the X-ray machine.  Each breast will be placed between two plastic or glass plates. The plates will compress your breast for a few seconds. Try to stay as relaxed as possible during the procedure. This does not cause any harm to your breasts and any discomfort you feel will be very brief.  X-rays will be taken from different angles of each breast. The procedure may vary among health care providers and hospitals. What happens after the procedure?  The mammogram will be examined by a specialist (radiologist).  You may need to repeat certain parts of the test, depending on the quality of the images. This is commonly done if the radiologist needs a better view of the breast tissue.  You may resume your normal activities.  It is up to you to get the results of your procedure. Ask your health care provider, or the department that is doing the procedure, when your results will be ready. Summary  A mammogram is a low energy X-ray of the breasts that is done to check for abnormal changes. A man may have a mammogram if he has a lump  or swelling in his breast.  If you have had a mammogram done at a different facility in the past, get the mammogram X-rays or have them sent to your current exam facility in order to compare them.  Schedule your test about 1-2 weeks after your menstrual period if you are still menstruating.  For this test, each breast will be placed between two plastic or glass plates. The plates will compress your breast for a few seconds.  Ask when your test results will be ready. Make sure you get your test results. This information is not intended to replace advice given to you by your health care provider. Make sure you discuss any questions you have with your health care provider. Document Released: 08/05/2000 Document Revised: 03/29/2018 Document Reviewed: 03/29/2018 Elsevier Patient Education  2020 [employer].

## 2019-08-02 NOTE — Addendum Note (Signed)
Addended by: Richarda Osmond on: 08/02/2019 11:21 AM   Modules accepted: Orders

## 2019-08-02 NOTE — Progress Notes (Addendum)
   Subjective:    Patient ID: [name], female    DOB: Jul 01, 1976, 43 y.o.   MRN: [id number]  CC: yeast infection concerns  HPI:  Vaginitis: Patient complains of an abnormal vaginal discharge for 3 weeks. Vaginal symptoms include discharge described as white, odor and vulvar itching.Vulvar symptoms include vulvar itching.STI Risk: Very low risk of STD exposureDischarge described as: white and thick.Menstrual pattern: regular period end of November. Contraception: abstinence. Has not had intercourse for ~3 months. Partner had yeast infection at that time and was treated at ED. Denies urinary symptoms.   GERD: patient endorses continued gerd symptoms which are well controlled with omeprazole and would like a refill and another medication to try when that doesn't work fast enough.   Chronic essential HTN- well controlled on current regimen, denies hyper/hypo symptoms. Has been several years since gotten labs.   Smoking status reviewed   ROS: pertinent noted in the HPI   I have personally reviewed pertinent past medical history, surgical, family, and social history as appropriate. Objective:  BP 127/80   Pulse 89   Wt 253 lb 6.4 oz (114.9 kg)   SpO2 99%   BMI 37.42 kg/m   Vitals and nursing note reviewed  General: NAD, pleasant, able to participate in exam Cardiac: RRR, negative for murmurs Respiratory: normal work of breathing Pelvic: normal external exam without lesions. Moderate  white mucoid discharge in vaginal canal. negatative for cervical lesions. Extremities: no edema or cyanosis. Skin: warm and dry, no rashes noted Neuro: alert, no obvious focal deficits Psych: Normal affect and mood  Assessment & Plan:   Vaginal itching Concern for yeast infection. Wet prep done today. Will fill diflucan as needed. No concerns for STDs or pregnancy today.  Declines STD screening including HIV  Essential hypertension, benign Takes BP med as directed daily. Well controlled  today Refill today CMP, lipid panel, TSH  GERD (gastroesophageal reflux disease) Refilled omeprazole, prescribe sucralfate   Influenza vaccine administered 12/11  Orders Placed This Encounter  Procedures  . Basic Metabolic Panel  . Lipid Panel  . TSH  . POCT Wet Prep [facility])    Meds ordered this encounter  Medications  . omeprazole (PRILOSEC) 20 MG capsule    Sig: TAKE 1 CAPSULE BY MOUTH EVERY DAY    Dispense:  90 capsule    Refill:  0  . sucralfate (CARAFATE) 1 GM/10ML suspension    Sig: Take 10 mLs (1 g total) by mouth 4 (four) times daily -  with meals and at bedtime.    Dispense:  420 mL    Refill:  0  . hydrochlorothiazide (MICROZIDE) 12.5 MG capsule    Sig: Take 1 capsule (12.5 mg total) by mouth daily.    Dispense:  90 capsule    Refill:  3  . fluconazole (DIFLUCAN) 150 MG tablet    Sig: Take 1 tablet (150 mg total) by mouth once for 1 dose.    Dispense:  1 tablet    Refill:  0    Doristine Mango, [place] PGY-2

## 2019-08-03 LAB — LIPID PANEL
Chol/HDL Ratio: 3.7 ratio (ref 0.0–4.4)
Cholesterol, Total: 137 mg/dL (ref 100–199)
HDL: 37 mg/dL — ABNORMAL LOW (ref >39)
LDL Chol Calc (NIH): 77 mg/dL (ref 0–99)
Triglycerides: 131 mg/dL (ref 0–149)
VLDL Cholesterol Cal: 23 mg/dL (ref 5–40)

## 2019-08-03 LAB — BASIC METABOLIC PANEL
BUN/Creatinine Ratio: 12 (ref 9–23)
BUN: 9 mg/dL (ref 6–24)
CO2: 24 mmol/L (ref 20–29)
Calcium: 9.7 mg/dL (ref 8.7–10.2)
Chloride: 100 mmol/L (ref 96–106)
Creatinine, Ser: 0.76 mg/dL (ref 0.57–1.00)
GFR calc Af Amer: 112 mL/min/{1.73_m2} (ref >59)
GFR calc non Af Amer: 97 mL/min/{1.73_m2} (ref >59)
Glucose: 92 mg/dL (ref 65–99)
Potassium: 3.7 mmol/L (ref 3.5–5.2)
Sodium: 138 mmol/L (ref 134–144)

## 2019-08-03 LAB — TSH: TSH: 2.26 u[IU]/mL (ref 0.450–4.500)

## 2019-08-08 DIAGNOSIS — F3181 Bipolar II disorder: Secondary | ICD-10-CM | POA: Diagnosis not present

## 2019-08-22 DIAGNOSIS — F3181 Bipolar II disorder: Secondary | ICD-10-CM | POA: Diagnosis not present

## 2019-08-29 ENCOUNTER — Encounter: Payer: Self-pay | Admitting: Student in an Organized Health Care Education/Training Program

## 2019-08-29 DIAGNOSIS — F3181 Bipolar II disorder: Secondary | ICD-10-CM | POA: Diagnosis not present

## 2019-09-04 DIAGNOSIS — F3181 Bipolar II disorder: Secondary | ICD-10-CM | POA: Diagnosis not present

## 2019-09-05 DIAGNOSIS — F3181 Bipolar II disorder: Secondary | ICD-10-CM | POA: Diagnosis not present

## 2019-09-06 DIAGNOSIS — F3181 Bipolar II disorder: Secondary | ICD-10-CM | POA: Diagnosis not present

## 2019-09-12 DIAGNOSIS — F3181 Bipolar II disorder: Secondary | ICD-10-CM | POA: Diagnosis not present

## 2019-09-17 DIAGNOSIS — F3181 Bipolar II disorder: Secondary | ICD-10-CM | POA: Diagnosis not present

## 2019-09-19 DIAGNOSIS — F3181 Bipolar II disorder: Secondary | ICD-10-CM | POA: Diagnosis not present

## 2019-10-02 DIAGNOSIS — F3181 Bipolar II disorder: Secondary | ICD-10-CM | POA: Diagnosis not present

## 2019-10-10 DIAGNOSIS — F3181 Bipolar II disorder: Secondary | ICD-10-CM | POA: Diagnosis not present

## 2019-10-11 DIAGNOSIS — F3181 Bipolar II disorder: Secondary | ICD-10-CM | POA: Diagnosis not present

## 2019-10-17 DIAGNOSIS — F3181 Bipolar II disorder: Secondary | ICD-10-CM | POA: Diagnosis not present

## 2019-10-30 DIAGNOSIS — F3181 Bipolar II disorder: Secondary | ICD-10-CM | POA: Diagnosis not present

## 2019-10-31 DIAGNOSIS — F3181 Bipolar II disorder: Secondary | ICD-10-CM | POA: Diagnosis not present

## 2019-11-04 ENCOUNTER — Other Ambulatory Visit: Payer: Self-pay

## 2019-11-04 ENCOUNTER — Encounter (HOSPITAL_COMMUNITY): Payer: Self-pay

## 2019-11-04 ENCOUNTER — Encounter: Payer: Self-pay | Admitting: Family Medicine

## 2019-11-04 ENCOUNTER — Emergency Department (HOSPITAL_COMMUNITY)
Admission: EM | Admit: 2019-11-04 | Discharge: 2019-11-05 | Disposition: A | Discharge status: Home / Self Care | Payer: Medicaid Other | Attending: Emergency Medicine | Admitting: Emergency Medicine

## 2019-11-04 ENCOUNTER — Telehealth (INDEPENDENT_AMBULATORY_CARE_PROVIDER_SITE_OTHER): Payer: Medicaid Other | Admitting: Family Medicine

## 2019-11-04 DIAGNOSIS — R200 Anesthesia of skin: Secondary | ICD-10-CM | POA: Diagnosis present

## 2019-11-04 DIAGNOSIS — R42 Dizziness and giddiness: Secondary | ICD-10-CM

## 2019-11-04 DIAGNOSIS — M5410 Radiculopathy, site unspecified: Secondary | ICD-10-CM | POA: Diagnosis not present

## 2019-11-04 DIAGNOSIS — Z79899 Other long term (current) drug therapy: Secondary | ICD-10-CM | POA: Insufficient documentation

## 2019-11-04 DIAGNOSIS — Z87891 Personal history of nicotine dependence: Secondary | ICD-10-CM | POA: Insufficient documentation

## 2019-11-04 DIAGNOSIS — Z91040 Latex allergy status: Secondary | ICD-10-CM | POA: Insufficient documentation

## 2019-11-04 NOTE — Progress Notes (Signed)
[place] [facility] Medicine Center Telemedicine Visit  Patient consented to have virtual visit. Method of visit: Video  Encounter participants: Patient: [name] - located at home Provider: Garnette Gunner - located at office Others (if applicable): n/a  Chief Complaint: Dizziness  HPI:  Patient reports 3 days of dizziness and left arm numbness and weakness.  Patient states that she woke up this way.  The left arm numbness lasted for 2 days but has gotten better.  The dizziness has persisted.  It does not seem to be associated with movement.  Patient denies headache, cough, URI symptoms, breath, chest pain.  Patient history of tobacco abuse but has quit smoking over the past 1 month.  Denies any new medications.  Also with history of migraine, but does not endorse headache.  Patient with history of vertigo, but states that this feels very different from vertigo.  Has not tried anything for the vertigo.  Patient has able to eat and drink and ambulate without issue.  Denies any difficulty speaking or swallowing.  Patient is concerned that she has had a "mini stroke".  Discussed with patient that per the ABCD2: 4-moderate risk.  Discussed finding with patient.  His understanding and plans to go to emergency room for evaluation.  ROS: per HPI  Pertinent PMHx:  HTN Vertigo  Exam:  Gen: NAD, resting comfortably HEENT: EOMI Pulm: NWOB Skin: no rash on face Neuro: no facial asymmetry or dysmetria Psych: Normal affect  Assessment/Plan:  Lightheadedness Persistent lightheaded.  Unlike other types in the past the patient may have experienced vertigo or migraine with aura.  Also associated with left arm numbness.  Moderate risk for TIA per ABCD2.  Other risk factors include history of smoking, HTN.  Recommend emergency department evaluation for urgent work-up for TIA.    Time spent during visit with patient: 10 minutes

## 2019-11-04 NOTE — Assessment & Plan Note (Signed)
Persistent lightheaded.  Unlike other types in the past the patient may have experienced vertigo or migraine with aura.  Also associated with left arm numbness.  Moderate risk for TIA per ABCD2.  Other risk factors include history of smoking, HTN.  Recommend emergency department evaluation for urgent work-up for TIA.

## 2019-11-04 NOTE — ED Triage Notes (Signed)
Pt reports Left arm heaviness/numbness x3 days and feeling light headed x2 days. Pt seen PCP and was sent here for further evaluation

## 2019-11-05 ENCOUNTER — Telehealth (INDEPENDENT_AMBULATORY_CARE_PROVIDER_SITE_OTHER): Payer: Medicaid Other | Admitting: Family Medicine

## 2019-11-05 DIAGNOSIS — R42 Dizziness and giddiness: Secondary | ICD-10-CM

## 2019-11-05 DIAGNOSIS — M5412 Radiculopathy, cervical region: Secondary | ICD-10-CM | POA: Diagnosis not present

## 2019-11-05 LAB — COMPREHENSIVE METABOLIC PANEL
ALT: 19 U/L (ref 0–44)
AST: 19 U/L (ref 15–41)
Albumin: 3.8 g/dL (ref 3.5–5.0)
Alkaline Phosphatase: 46 U/L (ref 38–126)
Anion gap: 9 (ref 5–15)
BUN: 8 mg/dL (ref 6–20)
CO2: 24 mmol/L (ref 22–32)
Calcium: 9 mg/dL (ref 8.9–10.3)
Chloride: 104 mmol/L (ref 98–111)
Creatinine, Ser: 0.69 mg/dL (ref 0.44–1.00)
GFR calc Af Amer: >60 mL/min (ref >60)
GFR calc non Af Amer: >60 mL/min (ref >60)
Glucose, Bld: 116 mg/dL — ABNORMAL HIGH (ref 70–99)
Potassium: 3.4 mmol/L — ABNORMAL LOW (ref 3.5–5.1)
Sodium: 137 mmol/L (ref 135–145)
Total Bilirubin: 0.5 mg/dL (ref 0.3–1.2)
Total Protein: 7.1 g/dL (ref 6.5–8.1)

## 2019-11-05 LAB — CBC WITH DIFFERENTIAL/PLATELET
Abs Immature Granulocytes: 0.05 10*3/uL (ref 0.00–0.07)
Basophils Absolute: 0 10*3/uL (ref 0.0–0.1)
Basophils Relative: 0 %
Eosinophils Absolute: 0.1 10*3/uL (ref 0.0–0.5)
Eosinophils Relative: 1 %
HCT: 38.6 % (ref 36.0–46.0)
Hemoglobin: 12 g/dL (ref 12.0–15.0)
Immature Granulocytes: 0 %
Lymphocytes Relative: 28 %
Lymphs Abs: 3.5 10*3/uL (ref 0.7–4.0)
MCH: 22.7 pg — ABNORMAL LOW (ref 26.0–34.0)
MCHC: 31.1 g/dL (ref 30.0–36.0)
MCV: 73 fL — ABNORMAL LOW (ref 80.0–100.0)
Monocytes Absolute: 1 10*3/uL (ref 0.1–1.0)
Monocytes Relative: 8 %
Neutro Abs: 7.7 10*3/uL (ref 1.7–7.7)
Neutrophils Relative %: 63 %
Platelets: 273 10*3/uL (ref 150–400)
RBC: 5.29 MIL/uL — ABNORMAL HIGH (ref 3.87–5.11)
RDW: 16.1 % — ABNORMAL HIGH (ref 11.5–15.5)
WBC: 12.4 10*3/uL — ABNORMAL HIGH (ref 4.0–10.5)
nRBC: 0 % (ref 0.0–0.2)

## 2019-11-05 LAB — URINALYSIS, ROUTINE W REFLEX MICROSCOPIC
Bilirubin Urine: NEGATIVE
Glucose, UA: NEGATIVE mg/dL
Ketones, ur: NEGATIVE mg/dL
Nitrite: NEGATIVE
Protein, ur: 30 mg/dL — AB
Specific Gravity, Urine: 1.03 (ref 1.005–1.030)
WBC, UA: >50 WBC/hpf — ABNORMAL HIGH (ref 0–5)
pH: 5 (ref 5.0–8.0)

## 2019-11-05 LAB — I-STAT BETA HCG BLOOD, ED (MC, WL, AP ONLY): I-stat hCG, quantitative: <5 m[IU]/mL (ref <5)

## 2019-11-05 MED ORDER — FLUCONAZOLE 150 MG PO TABS: 150.0000 mg | ORAL_TABLET | Freq: Once | ORAL | 0 refills | Status: AC

## 2019-11-05 MED ORDER — MECLIZINE HCL 12.5 MG PO TABS: 12.5000 mg | ORAL_TABLET | Freq: Three times a day (TID) | ORAL | 0 refills | Status: DC | PRN

## 2019-11-05 NOTE — Discharge Instructions (Signed)
We believe that your symptoms are due to a pinched nerve or muscle spasm in your neck/shoulder.  Given that your symptoms are improving, continue with Advil or Aleve for management of persistent discomfort.  You may also alternate ice and heat packs to try and alleviate symptoms.  Follow-up with your primary care doctor.  We advise return to the ED for new or concerning symptoms.

## 2019-11-05 NOTE — ED Provider Notes (Signed)
[facility] EMERGENCY DEPARTMENT Provider Note   CSN: [id number] Arrival date & time: 11/04/19  1640     History Chief Complaint  Patient presents with  . Numbness    [name] is a 44 y.o. female.  44 year old female presents to the emergency department for evaluation of numbness and tingling of her left arm.  Symptoms initially began 3 days ago.  She reports feeling as though her extremity was "heavy".  Has been using over-the-counter anti-inflammatories.  Notes spontaneous improvement to her symptoms since onset.  No associated neck pain, shoulder pain.  Denies any recent trauma or injury, heavy lifting, headache, vision changes.  She is right-hand dominant.  Was advised to seek evaluation in the emergency department by her primary care doctor to rule out concern for stroke or TIA.  She denies any associated numbness or tingling in her lower extremities including her L leg.  The history is provided by the patient. No language interpreter was used.       Past Medical History:  Diagnosis Date  . Hypertension   . Migraine   . Vertigo     Patient Active Problem List   Diagnosis Date Noted  . Vaginal itching 08/02/2019  . Influenza vaccine administered 08/02/2019  . Bipolar 2 disorder ([place]) 03/25/2016  . Lightheadedness 07/29/2014  . Essential hypertension, benign 09/27/2013  . GERD (gastroesophageal reflux disease) 05/02/2013  . Chronic lumbar pain 10/02/2012  . Migraine without aura 07/05/2012  . Anxiety state 08/18/2010  . TOBACCO ABUSE 08/18/2010  . OBESITY NOS 01/26/2007  . ALLERGIC RHINITIS, SEASONAL 01/26/2007    History reviewed. No pertinent surgical history.   OB History   No obstetric history on file.     Family History  Problem Relation Age of Onset  . Hypertension Mother   . Diabetes Mother     Social History   Tobacco Use  . Smoking status: Former Smoker    Packs/day: 0.50    Years: 19.00    Pack years: 9.50   Types: Cigarettes    Quit date: 09/23/2019    Years since quitting: 0.1  . Smokeless tobacco: Never Used  Substance Use Topics  . Alcohol use: No  . Drug use: No    Home Medications Prior to Admission medications   Medication Sig Start Date End Date Taking? Authorizing Provider  docusate sodium (COLACE) 100 MG capsule Take 1 capsule (100 mg total) by mouth 2 (two) times daily. Patient taking differently: Take 100 mg by mouth 2 (two) times daily as needed for mild constipation.  12/06/18  Yes Anderson, Chelsey L, DO  EPINEPHrine 0.3 mg/0.3 mL IJ SOAJ injection Inject 0.3 mLs (0.3 mg total) into the muscle as needed for anaphylaxis. 04/05/19  Yes Martyn Malay, MD  fluticasone (FLONASE) 50 MCG/ACT nasal spray Place 2 sprays into both nostrils daily. Patient taking differently: Place 2 sprays into both nostrils daily as needed for allergies.  11/28/18  Yes Anderson, Chelsey L, DO  hydrochlorothiazide (MICROZIDE) 12.5 MG capsule Take 1 capsule (12.5 mg total) by mouth daily. 08/02/19  Yes Anderson, Chelsey L, DO  hydrOXYzine (ATARAX/VISTARIL) 10 MG tablet Take 1 tablet (10 mg total) by mouth 3 (three) times daily as needed. Patient taking differently: Take 10 mg by mouth 3 (three) times daily as needed for itching or anxiety.  02/12/19  Yes Lovenia Kim, MD  loratadine (CLARITIN) 10 MG tablet Take 1 tablet (10 mg total) by mouth daily. 03/28/19  Yes Richarda Osmond,  DO  montelukast (SINGULAIR) 10 MG tablet Take 1 tablet (10 mg total) by mouth at bedtime. 11/02/17  Yes Mikell, Antionette Poles, MD  omeprazole (PRILOSEC) 20 MG capsule TAKE 1 CAPSULE BY MOUTH EVERY DAY 08/02/19  Yes Anderson, Chelsey L, DO  Oxcarbazepine (TRILEPTAL) 300 MG tablet Take 300 mg by mouth at bedtime.    Yes [provider]  polyethylene glycol (MIRALAX / GLYCOLAX) 17 g packet Take 17 g by mouth 3 (three) times daily. Until stooling regularly Patient taking differently: Take 17 g by mouth daily. Until stooling  regularly 03/28/19  Yes Anderson, Chelsey L, DO  propranolol (INDERAL) 20 MG tablet Take 1 tablet (20 mg total) by mouth 3 (three) times daily. Patient taking differently: Take 20 mg by mouth 3 (three) times daily as needed (headaches).  01/04/18  Yes Mikell, Antionette Poles, MD  sucralfate (CARAFATE) 1 GM/10ML suspension Take 10 mLs (1 g total) by mouth 4 (four) times daily -  with meals and at bedtime. 08/02/19  Yes Anderson, Chelsey L, DO  SUMAtriptan (IMITREX) 50 MG tablet TAKE 1 TABLET BY MOUTH FOR HEADACHE. MAY REPEAT IN 2 HOURS IF HEADACHE PERSIST Patient taking differently: Take 50 mg by mouth every 2 (two) hours as needed for migraine. TAKE 1 TABLET BY MOUTH FOR HEADACHE. MAY REPEAT IN 2 HOURS IF HEADACHE PERSIST 11/28/18  Yes Anderson, Chelsey L, DO  meclizine (ANTIVERT) 12.5 MG tablet Take 1 tablet (12.5 mg total) by mouth 3 (three) times daily as needed for dizziness. 03/16/15   Nani Ravens, MD  tiZANidine (ZANAFLEX) 4 MG capsule Take 1 capsule (4 mg total) by mouth 2 (two) times daily as needed (for migraine, NOT BEFORE DRIVING). Do not take with flexeril 05/31/18   Howard Pouch, MD    Allergies    Aripiprazole, Escitalopram oxalate, and Latex  Review of Systems   Review of Systems  Ten systems reviewed and are negative for acute change, except as noted in the HPI.    Physical Exam Updated Vital Signs BP (!) 142/76   Pulse 88   Temp 98 F (36.7 C)   Resp 16   Ht 5\' 9"  (1.753 m)   Wt 115.2 kg   SpO2 100%   BMI 37.51 kg/m   Physical Exam Vitals and nursing note reviewed.  Constitutional:      General: She is not in acute distress.    Appearance: She is well-developed. She is not diaphoretic.     Comments: Nontoxic appearing and in NAD  HENT:     Head: Normocephalic and atraumatic.  Eyes:     General: No scleral icterus.    Conjunctiva/sclera: Conjunctivae normal.  Neck:     Comments: No tenderness to palpation of the cervical midline. No bony deformities, step-offs,  crepitus. No meningismus. Cardiovascular:     Rate and Rhythm: Normal rate and regular rhythm.     Pulses: Normal pulses.     Comments: Distal radial pulse 2+ in the left upper extremity Pulmonary:     Effort: Pulmonary effort is normal. No respiratory distress.     Comments: Respirations even and unlabored Musculoskeletal:        General: Normal range of motion.     Cervical back: Normal range of motion.     Comments: Full range of motion of the left upper extremity. No tenderness to palpation or crepitus of the left trapezius or shoulder.  Skin:    General: Skin is warm and dry.     Coloration:  Skin is not pale.     Findings: No erythema or rash.  Neurological:     Mental Status: She is alert and oriented to person, place, and time.     Cranial Nerves: No cranial nerve deficit.     Coordination: Coordination normal.     Comments: GCS 15. Speech is goal oriented. Patient has equal grip strength bilaterally with 5/5 strength against resistance in all major muscle groups bilaterally. Sensation to light touch intact. Patient moves extremities without ataxia. No pronator drift.   Psychiatric:        Behavior: Behavior normal.     ED Results / Procedures / Treatments   Labs (all labs ordered are listed, but only abnormal results are displayed) Labs Reviewed  CBC WITH DIFFERENTIAL/PLATELET - Abnormal; Notable for the following components:      Result Value   WBC 12.4 (*)    RBC 5.29 (*)    MCV 73.0 (*)    MCH 22.7 (*)    RDW 16.1 (*)    All other components within normal limits  COMPREHENSIVE METABOLIC PANEL - Abnormal; Notable for the following components:   Potassium 3.4 (*)    Glucose, Bld 116 (*)    All other components within normal limits  URINALYSIS, ROUTINE W REFLEX MICROSCOPIC - Abnormal; Notable for the following components:   Color, Urine AMBER (*)    APPearance CLOUDY (*)    Hgb urine dipstick SMALL (*)    Protein, ur 30 (*)    Leukocytes,Ua LARGE (*)    WBC, UA  >50 (*)    Bacteria, UA MANY (*)    All other components within normal limits  URINE CULTURE  I-STAT BETA HCG BLOOD, ED ([facility], [facility], [facility] ONLY)    EKG None  Radiology No results found.  Procedures Procedures (including critical care time)  Medications Ordered in ED Medications - No data to display  ED Course  I have reviewed the triage vital signs and the nursing notes.  Pertinent labs & imaging results that were available during my care of the patient were reviewed by me and considered in my medical decision making (see chart for details).  1:30 AM Labs reviewed, ordered in triage; urinalysis favored to be representative of specimen contamination as patient without dysuria, hematuria, urgency, frequency.    MDM Rules/Calculators/A&P                      44 year old female presenting for evaluation of "heaviness" and numbness/paresthesias in her left arm.  Symptoms began 3 days ago and have been spontaneously improving.  She is neurovascularly intact on exam.  Noted to have 5/5 grip strength bilaterally with preserved strength against resistance.  Reports concern from her PCP for CVA or TIA, though her symptoms are inconsistent with this.  More likely that the patient suffered an episode of peripheral radiculopathy which has been improving with NSAIDs.  She has no reproducible neck pain or tenderness to palpation.  Believe it is reasonable for her to undergo further evaluation as an outpatient as needed.  Return precautions discussed and provided. Patient discharged in stable condition with no unaddressed concerns.   Final Clinical Impression(s) / ED Diagnoses Final diagnoses:  Radiculopathy affecting upper extremity    Rx / DC Orders ED Discharge Orders    None       Antony Madura, PA-C 11/07/19 0403    Gilda Crease, MD 11/09/19 [phone]

## 2019-11-05 NOTE — Progress Notes (Signed)
[place] [facility] Medicine [facility] Telemedicine Visit  Patient consented to have virtual visit. Method of visit: Video was attempted, but technology challenges prevented patient from using video, so visit was conducted via telephone.  Encounter participants: Patient: [name] - located at home Provider: Sandre Kitty - located at [facility] Others (if applicable): none  Chief Complaint: dizziness  HPI: Dizziness - was seen in the ED last night for dizziness and left arm/neck numbness/tingling. Pt was sent home and told to take ibuprofen. woke up today with dizziness.  Room was 'spinnning' when she got up this morning. The dizziness comes and goes. No hearing loss. No vomiting.  No nausea.   Pt has chronic Jaw pain bilaterally. Has been experiencing 4 days of lightheadedness, numbness in arm on left side. Sensation in her Arm is improved but there is still some pain in the neck. Arm/neck felt numb/tingling, but not weak.  No chest pain. No SOB.    ROS: per HPI  Pertinent PMHx: hx of vertigo, HTN, migraine  Exam:  Respiratory: speaking full sentences without difficulty.   Gen: no dysarthria. No acute distress.   Assessment/Plan:  Vertigo Still experiencing symptoms after being sent home from ED early this morning.  No work up to rule out stroke as a cause of dizziness and left upper extremity numbness was performed.  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  - 30 tabs meclizine - amb ref to vestibular PT  Cervical radiculopathy Pt complaining of left neck and arm paresthesia/anesthesia but denies weakness. Has a hx of lumbar disc disease but no imaging of cervical spine and nothing noted in problem list regarding cervical spine. Unable to assess today d/t inability to perform physical exam.  Advise further workup at patient's next in-person appointment.     Time spent during visit with  patient: 18 minutes

## 2019-11-05 NOTE — ED Notes (Signed)
Patient verbalizes understanding of discharge instructions. Opportunity for questioning and answers were provided. Armband removed by staff, pt discharged from ED ambulatory.   

## 2019-11-06 ENCOUNTER — Telehealth: Payer: Medicaid Other | Admitting: Family Medicine

## 2019-11-06 LAB — URINE CULTURE

## 2019-11-07 DIAGNOSIS — F3181 Bipolar II disorder: Secondary | ICD-10-CM | POA: Diagnosis not present

## 2019-11-07 DIAGNOSIS — M5412 Radiculopathy, cervical region: Secondary | ICD-10-CM | POA: Insufficient documentation

## 2019-11-07 NOTE — Assessment & Plan Note (Signed)
Still experiencing symptoms after being sent home from ED early this morning.  No work up to rule out stroke as a cause of dizziness and left upper extremity numbness was performed.  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  - 30 tabs meclizine - amb ref to vestibular PT

## 2019-11-07 NOTE — Assessment & Plan Note (Signed)
Pt complaining of left neck and arm paresthesia/anesthesia but denies weakness. Has a hx of lumbar disc disease but no imaging of cervical spine and nothing noted in problem list regarding cervical spine. Unable to assess today d/t inability to perform physical exam.  Advise further workup at patient's next in-person appointment.

## 2019-11-14 DIAGNOSIS — F3181 Bipolar II disorder: Secondary | ICD-10-CM | POA: Diagnosis not present

## 2019-11-27 DIAGNOSIS — F3181 Bipolar II disorder: Secondary | ICD-10-CM | POA: Diagnosis not present

## 2019-11-28 DIAGNOSIS — F3181 Bipolar II disorder: Secondary | ICD-10-CM | POA: Diagnosis not present

## 2019-12-04 DIAGNOSIS — F3181 Bipolar II disorder: Secondary | ICD-10-CM | POA: Diagnosis not present

## 2019-12-05 DIAGNOSIS — F3181 Bipolar II disorder: Secondary | ICD-10-CM | POA: Diagnosis not present

## 2019-12-12 DIAGNOSIS — F3181 Bipolar II disorder: Secondary | ICD-10-CM | POA: Diagnosis not present

## 2019-12-19 DIAGNOSIS — F3181 Bipolar II disorder: Secondary | ICD-10-CM | POA: Diagnosis not present

## 2019-12-24 DIAGNOSIS — F3181 Bipolar II disorder: Secondary | ICD-10-CM | POA: Diagnosis not present

## 2019-12-30 ENCOUNTER — Encounter (HOSPITAL_COMMUNITY): Payer: Self-pay | Admitting: Emergency Medicine

## 2019-12-30 ENCOUNTER — Emergency Department (HOSPITAL_COMMUNITY)
Admission: EM | Admit: 2019-12-30 | Discharge: 2019-12-31 | Disposition: A | Discharge status: Home / Self Care | Payer: Medicaid Other | Attending: Emergency Medicine | Admitting: Emergency Medicine

## 2019-12-30 ENCOUNTER — Emergency Department (HOSPITAL_COMMUNITY): Payer: Medicaid Other

## 2019-12-30 DIAGNOSIS — Z91040 Latex allergy status: Secondary | ICD-10-CM | POA: Insufficient documentation

## 2019-12-30 DIAGNOSIS — R079 Chest pain, unspecified: Secondary | ICD-10-CM | POA: Diagnosis not present

## 2019-12-30 DIAGNOSIS — I10 Essential (primary) hypertension: Secondary | ICD-10-CM | POA: Diagnosis not present

## 2019-12-30 DIAGNOSIS — Z79899 Other long term (current) drug therapy: Secondary | ICD-10-CM | POA: Insufficient documentation

## 2019-12-30 DIAGNOSIS — Z87891 Personal history of nicotine dependence: Secondary | ICD-10-CM | POA: Diagnosis not present

## 2019-12-30 DIAGNOSIS — R0789 Other chest pain: Secondary | ICD-10-CM | POA: Insufficient documentation

## 2019-12-30 LAB — BASIC METABOLIC PANEL
Anion gap: 11 (ref 5–15)
BUN: 10 mg/dL (ref 6–20)
CO2: 24 mmol/L (ref 22–32)
Calcium: 9.2 mg/dL (ref 8.9–10.3)
Chloride: 102 mmol/L (ref 98–111)
Creatinine, Ser: 0.69 mg/dL (ref 0.44–1.00)
GFR calc Af Amer: >60 mL/min (ref >60)
GFR calc non Af Amer: >60 mL/min (ref >60)
Glucose, Bld: 118 mg/dL — ABNORMAL HIGH (ref 70–99)
Potassium: 3.6 mmol/L (ref 3.5–5.1)
Sodium: 137 mmol/L (ref 135–145)

## 2019-12-30 LAB — CBC
HCT: 34.9 % — ABNORMAL LOW (ref 36.0–46.0)
Hemoglobin: 10.8 g/dL — ABNORMAL LOW (ref 12.0–15.0)
MCH: 22.1 pg — ABNORMAL LOW (ref 26.0–34.0)
MCHC: 30.9 g/dL (ref 30.0–36.0)
MCV: 71.5 fL — ABNORMAL LOW (ref 80.0–100.0)
Platelets: 301 10*3/uL (ref 150–400)
RBC: 4.88 MIL/uL (ref 3.87–5.11)
RDW: 15.6 % — ABNORMAL HIGH (ref 11.5–15.5)
WBC: 12.5 10*3/uL — ABNORMAL HIGH (ref 4.0–10.5)
nRBC: 0 % (ref 0.0–0.2)

## 2019-12-30 LAB — TROPONIN I (HIGH SENSITIVITY): Troponin I (High Sensitivity): 3 ng/L (ref <18)

## 2019-12-30 LAB — I-STAT BETA HCG BLOOD, ED (MC, WL, AP ONLY): I-stat hCG, quantitative: <5 m[IU]/mL (ref <5)

## 2019-12-30 MED ORDER — SODIUM CHLORIDE 0.9% FLUSH: 3.0000 mL | Freq: Once | INTRAVENOUS | Status: DC

## 2019-12-30 NOTE — ED Triage Notes (Signed)
Pt in POV, reports R sided CP that is worse with movement/palpation, inspiration. States pain started this AM. Sore, 9/10. Pt in NAD.

## 2019-12-31 ENCOUNTER — Emergency Department (HOSPITAL_COMMUNITY): Payer: Medicaid Other

## 2019-12-31 DIAGNOSIS — R079 Chest pain, unspecified: Secondary | ICD-10-CM | POA: Diagnosis not present

## 2019-12-31 LAB — TROPONIN I (HIGH SENSITIVITY): Troponin I (High Sensitivity): 4 ng/L (ref <18)

## 2019-12-31 LAB — D-DIMER, QUANTITATIVE: D-Dimer, Quant: 0.86 ug/mL-FEU — ABNORMAL HIGH (ref 0.00–0.50)

## 2019-12-31 MED ORDER — ACETAMINOPHEN 325 MG PO TABS
650.0000 mg | ORAL_TABLET | Freq: Once | ORAL | Status: AC
  Administered 2019-12-31: 650 mg via ORAL
  Filled 2019-12-31: qty 2

## 2019-12-31 MED ORDER — IOHEXOL 350 MG/ML SOLN
80.0000 mL | Freq: Once | INTRAVENOUS | Status: AC | PRN
  Administered 2019-12-31: 03:00:00 80 mL via INTRAVENOUS

## 2019-12-31 MED ORDER — METHOCARBAMOL 500 MG PO TABS: 500.0000 mg | ORAL_TABLET | Freq: Three times a day (TID) | ORAL | 0 refills | Status: DC | PRN

## 2019-12-31 MED ORDER — KETOROLAC TROMETHAMINE 15 MG/ML IJ SOLN
15.0000 mg | Freq: Once | INTRAMUSCULAR | Status: AC
  Administered 2019-12-31: 03:00:00 15 mg via INTRAVENOUS
  Filled 2019-12-31: qty 1

## 2019-12-31 MED ORDER — NAPROXEN 500 MG PO TABS: 500.0000 mg | ORAL_TABLET | Freq: Two times a day (BID) | ORAL | 0 refills | Status: DC

## 2019-12-31 NOTE — ED Provider Notes (Signed)
[facility] EMERGENCY DEPARTMENT Provider Note   CSN: [id number] Arrival date & time: 12/30/19  2106     History Chief Complaint  Patient presents with  . Chest Pain    [name] is a 44 y.o. female with a history of hypertension, obesity, GERD, anxiety, & bipolar 2 disorder who presents to the ED with complaints of chest pain that began this morning upon waking up.  Patient states pain is located to the right lower lateral chest, it is sore in nature, worse with a deep breath or with certain movements, no alleviating factors.  No intervention prior to arrival.  Has felt a bit short of breath with the discomfort.  Denies fever, chills, nausea, vomiting, diaphoresis, dizziness, syncope, leg swelling, hemoptysis, recent surgery/trauma, recent long travel, hormone use, or hx of DVT/PE.  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     HPI     Past Medical History:  Diagnosis Date  . Hypertension   . Migraine   . Vertigo     Patient Active Problem List   Diagnosis Date Noted  . Cervical radiculopathy 11/07/2019  . Vaginal itching 08/02/2019  . Influenza vaccine administered 08/02/2019  . Bipolar 2 disorder ([place]) 03/25/2016  . Vertigo 07/29/2014  . Essential hypertension, benign 09/27/2013  . GERD (gastroesophageal reflux disease) 05/02/2013  . Chronic lumbar pain 10/02/2012  . Migraine without aura 07/05/2012  . Anxiety state 08/18/2010  . TOBACCO ABUSE 08/18/2010  . OBESITY NOS 01/26/2007  . ALLERGIC RHINITIS, SEASONAL 01/26/2007    History reviewed. No pertinent surgical history.   OB History   No obstetric history on file.     Family History  Problem Relation Age of Onset  . Hypertension Mother   . Diabetes Mother     Social History   Tobacco Use  . Smoking status: Former Smoker    Packs/day: 0.50    Years: 19.00    Pack years: 9.50    Types: Cigarettes   Quit date: 09/23/2019    Years since quitting: 0.2  . Smokeless tobacco: Never Used  Substance Use Topics  . Alcohol use: No  . Drug use: No    Home Medications Prior to Admission medications   Medication Sig Start Date End Date Taking? Authorizing Provider  docusate sodium (COLACE) 100 MG capsule Take 1 capsule (100 mg total) by mouth 2 (two) times daily. Patient taking differently: Take 100 mg by mouth 2 (two) times daily as needed for mild constipation.  12/06/18   Anderson, Chelsey L, DO  EPINEPHrine 0.3 mg/0.3 mL IJ SOAJ injection Inject 0.3 mLs (0.3 mg total) into the muscle as needed for anaphylaxis. 04/05/19   Martyn Malay, MD  fluticasone (FLONASE) 50 MCG/ACT nasal spray Place 2 sprays into both nostrils daily. Patient taking differently: Place 2 sprays into both nostrils daily as needed for allergies.  11/28/18   Anderson, Chelsey L, DO  hydrochlorothiazide (MICROZIDE) 12.5 MG capsule Take 1 capsule (12.5 mg total) by mouth daily. 08/02/19   Anderson, Chelsey L, DO  hydrOXYzine (ATARAX/VISTARIL) 10 MG tablet Take 1 tablet (10 mg total) by mouth 3 (three) times daily as needed. Patient taking differently: Take 10 mg by mouth 3 (three) times daily as needed for itching or anxiety.  02/12/19   Lovenia Kim, MD  loratadine (CLARITIN) 10 MG tablet Take 1 tablet (10 mg total) by mouth daily. 03/28/19   Ouida Sills,  Chelsey L, DO  meclizine (ANTIVERT) 12.5 MG tablet Take 1 tablet (12.5 mg total) by mouth 3 (three) times daily as needed for dizziness. 11/05/19   Sandre Kitty, MD  montelukast (SINGULAIR) 10 MG tablet Take 1 tablet (10 mg total) by mouth at bedtime. 11/02/17   Mikell, Antionette Poles, MD  omeprazole (PRILOSEC) 20 MG capsule TAKE 1 CAPSULE BY MOUTH EVERY DAY 08/02/19   Jamelle Rushing L, DO  Oxcarbazepine (TRILEPTAL) 300 MG tablet Take 300 mg by mouth at bedtime.     [provider]  polyethylene glycol (MIRALAX / GLYCOLAX) 17 g packet Take 17 g by mouth 3 (three) times  daily. Until stooling regularly Patient taking differently: Take 17 g by mouth daily. Until stooling regularly 03/28/19   Jamelle Rushing L, DO  propranolol (INDERAL) 20 MG tablet Take 1 tablet (20 mg total) by mouth 3 (three) times daily. Patient taking differently: Take 20 mg by mouth 3 (three) times daily as needed (headaches).  01/04/18   Mikell, Antionette Poles, MD  sucralfate (CARAFATE) 1 GM/10ML suspension Take 10 mLs (1 g total) by mouth 4 (four) times daily -  with meals and at bedtime. 08/02/19   Anderson, Chelsey L, DO  SUMAtriptan (IMITREX) 50 MG tablet TAKE 1 TABLET BY MOUTH FOR HEADACHE. MAY REPEAT IN 2 HOURS IF HEADACHE PERSIST Patient taking differently: Take 50 mg by mouth every 2 (two) hours as needed for migraine. TAKE 1 TABLET BY MOUTH FOR HEADACHE. MAY REPEAT IN 2 HOURS IF HEADACHE PERSIST 11/28/18   Jamelle Rushing L, DO  tiZANidine (ZANAFLEX) 4 MG capsule Take 1 capsule (4 mg total) by mouth 2 (two) times daily as needed (for migraine, NOT BEFORE DRIVING). Do not take with flexeril 05/31/18   Howard Pouch, MD    Allergies    Aripiprazole, Escitalopram oxalate, and Latex  Review of Systems   Review of Systems  Constitutional: Negative for chills and fever.  HENT: Negative for congestion, ear pain and sore throat.   Respiratory: Positive for shortness of breath. Negative for cough.   Cardiovascular: Positive for chest pain. Negative for leg swelling.  Gastrointestinal: Negative for abdominal pain and vomiting.  Genitourinary: Positive for vaginal bleeding (Currently menstruating.).  Neurological: Negative for dizziness and syncope.  All other systems reviewed and are negative.   Physical Exam Updated Vital Signs BP (!) 145/106 (BP Location: Left Arm)   Pulse 96   Temp 98.7 F (37.1 C) (Oral)   Resp 16   Ht 5' 9.5" (1.765 m)   Wt 117.9 kg   LMP 12/27/2019   SpO2 99%   BMI 37.84 kg/m   Physical Exam Vitals and nursing note reviewed.  Constitutional:       General: She is not in acute distress.    Appearance: She is well-developed. She is not toxic-appearing.  HENT:     Head: Normocephalic and atraumatic.  Eyes:     General:        Right eye: No discharge.        Left eye: No discharge.     Conjunctiva/sclera: Conjunctivae normal.  Cardiovascular:     Rate and Rhythm: Normal rate and regular rhythm.     Pulses:          Radial pulses are 2+ on the right side and 2+ on the left side.  Pulmonary:     Effort: Pulmonary effort is normal. No respiratory distress.     Breath sounds: Normal breath sounds. No wheezing, rhonchi or  rales.  Chest:     Chest wall: No tenderness.  Abdominal:     General: There is no distension.     Palpations: Abdomen is soft.     Tenderness: There is no abdominal tenderness.  Musculoskeletal:     Cervical back: Neck supple.     Right lower leg: No tenderness. No edema.     Left lower leg: No tenderness. No edema.  Skin:    General: Skin is warm and dry.     Findings: No rash.  Neurological:     Mental Status: She is alert.     Comments: Clear speech.   Psychiatric:        Behavior: Behavior normal.    ED Results / Procedures / Treatments   Labs (all labs ordered are listed, but only abnormal results are displayed) Labs Reviewed  BASIC METABOLIC PANEL - Abnormal; Notable for the following components:      Result Value   Glucose, Bld 118 (*)    All other components within normal limits  CBC - Abnormal; Notable for the following components:   WBC 12.5 (*)    Hemoglobin 10.8 (*)    HCT 34.9 (*)    MCV 71.5 (*)    MCH 22.1 (*)    RDW 15.6 (*)    All other components within normal limits  D-DIMER, QUANTITATIVE (NOT AT [facility]) - Abnormal; Notable for the following components:   D-Dimer, Quant 0.86 (*)    All other components within normal limits  I-STAT BETA HCG BLOOD, ED ([facility], [facility], [facility] ONLY)  TROPONIN I (HIGH SENSITIVITY)  TROPONIN I (HIGH SENSITIVITY)    EKG EKG  Interpretation  Date/Time:  Monday Dec 30 2019 21:15:59 EDT Ventricular Rate:  82 PR Interval:  148 QRS Duration: 78 QT Interval:  358 QTC Calculation: 418 R Axis:   30 Text Interpretation: Normal sinus rhythm Cannot rule out Anterior infarct , age undetermined Abnormal ECG Confirmed by Zadie Rhine ([id number]) on 12/31/2019 12:38:40 AM   Radiology DG Chest 2 View  Result Date: 12/30/2019 CLINICAL DATA:  Right-sided chest pain EXAM: CHEST - 2 VIEW COMPARISON:  12/08/2003 FINDINGS: The heart size and mediastinal contours are within normal limits. Both lungs are clear. The visualized skeletal structures are unremarkable. IMPRESSION: No active cardiopulmonary disease. Electronically Signed   By: Alcide Clever M.D.   On: 12/30/2019 22:19   CT Angio Chest PE W/Cm &/Or Wo Cm  Result Date: 12/31/2019 CLINICAL DATA:  Chest pain. Positive D-dimer. EXAM: CT ANGIOGRAPHY CHEST WITH CONTRAST TECHNIQUE: Multidetector CT imaging of the chest was performed using the standard protocol during bolus administration of intravenous contrast. Multiplanar CT image reconstructions and MIPs were obtained to evaluate the vascular anatomy. CONTRAST:  20mL OMNIPAQUE IOHEXOL 350 MG/ML SOLN COMPARISON:  None. FINDINGS: Cardiovascular: Contrast injection is sufficient to demonstrate satisfactory opacification of the pulmonary arteries to the segmental level. There is no pulmonary embolus or evidence of right heart strain. The size of the main pulmonary artery is normal. Heart size is normal, with no pericardial effusion. The course and caliber of the aorta are normal. There is no atherosclerotic calcification. Opacification decreased due to pulmonary arterial phase contrast bolus timing. Mediastinum/Nodes: No mediastinal, hilar or axillary lymphadenopathy. Normal visualized thyroid. Thoracic esophageal course is normal. Lungs/Pleura: Airways are patent. No pleural effusion, lobar consolidation, pneumothorax or pulmonary  infarction. Upper Abdomen: Contrast bolus timing is not optimized for evaluation of the abdominal organs. The visualized portions of the organs of the upper  abdomen are normal. Musculoskeletal: No chest wall abnormality. No bony spinal canal stenosis. Review of the MIP images confirms the above findings. IMPRESSION: No pulmonary embolus or other acute thoracic abnormality. Electronically Signed   By: Deatra Robinson M.D.   On: 12/31/2019 02:43    Procedures Procedures (including critical care time)  Medications Ordered in ED Medications  sodium chloride flush (NS) 0.9 % injection 3 mL (3 mLs Intravenous Not Given 12/31/19 0028)  ketorolac (TORADOL) 15 MG/ML injection 15 mg (has no administration in time range)  acetaminophen (TYLENOL) tablet 650 mg (650 mg Oral Given 12/31/19 0027)  iohexol (OMNIPAQUE) 350 MG/ML injection 80 mL (80 mLs Intravenous Contrast Given 12/31/19 0237)    ED Course  I have reviewed the triage vital signs and the nursing notes.  Pertinent labs & imaging results that were available during my care of the patient were reviewed by me and considered in my medical decision making (see chart for details).     MDM Rules/Calculators/A&P                     Patient presents to the ED with concern of chest discomfort that started this morning. Patient is nontoxic appearing, resting comfortably, vitals WNL with the exception of mildly elevated BP- low suspicion for HTN emergency.  Patient has a benign physical exam. DDX: ACS, pulmonary embolism, dissection, GERD, anxiety, MSK, arrhythmia, critical anemia.  Additional history obtained:  Additional history obtained from chart review & nursing note review.   EKG: no STEMI Lab Tests:  I Ordered, reviewed, and interpreted labs, which included:  CBC: Mild leukocytosis and anemia. BMP: No significant electrolyte derangement. Pregnancy test: Negative Troponin: 3, 4 D-dimer: Elevated  Imaging Studies ordered:  I ordered imaging  studies which included CXR & CTA once d-dimer noted to be elevated, I independently visualized and interpreted imaging which showed:  CXR: no active cardiopulmonary disease.  CTA: No pulmonary embolus or other acute thoracic abnormality.   ED Course:  00:15: Initial assessment- given tylenol for pain. D-dimer ordered, delta trop pending.  Delta trop without significant elevation. D-dimer elevated--CTA ordered and negative for PE.   HEAR score low risk- EKG with no STEMI, delta troponin negative, doubt ACS. Elevated d-dimer, CTA without PE. Pain is not a tearing sensation, symmetric pulses, no widening of mediastinum on CXR or thoracic abnormalities on CTA, doubt dissection. Cardiac monitor reviewed, no notable arrhythmias or tachycardia. Labs overall reassuring. Patient has appeared hemodynamically stable throughout ER visit and appears safe for discharge with close PCP/cardiology follow up. Possibly MSK/pleurisy, will trial NSAIDs & muscle relaxant- discussed no driving/operating heavy machinery with muscle relaxer. I discussed results, treatment plan, need for follow-up, and return precautions with the patient. Provided opportunity for questions, patient confirmed understanding and is in agreement with plan.   Portions of this note were generated with Scientist, clinical (histocompatibility and immunogenetics). Dictation errors may occur despite best attempts at proofreading.  Final Clinical Impression(s) / ED Diagnoses Final diagnoses:  Chest pain, unspecified type    Rx / DC Orders ED Discharge Orders         Ordered    naproxen (NAPROSYN) 500 MG tablet  2 times daily     12/31/19 0305    methocarbamol (ROBAXIN) 500 MG tablet  Every 8 hours PRN     12/31/19 0305           Cherly Anderson, PA-C 12/31/19 [id number]    Zadie Rhine, MD 12/31/19 [phone]

## 2019-12-31 NOTE — Discharge Instructions (Addendum)
You were seen in the emergency department today for chest pain. Your work-up in the emergency department has been overall reassuring. Your labs have been fairly normal and or similar to previous blood work you have had done. Your EKG and the enzyme we use to check your heart did not show an acute heart attack at this time. Your chest x-ray was normal. Your CT scan did not show signs of a blood clot.   We are sending you home with the following medicines:  - Naproxen is a nonsteroidal anti-inflammatory medication that will help with pain and swelling. Be sure to take this medication as prescribed with food, 1 pill every 12 hours,  It should be taken with food, as it can cause stomach upset, and more seriously, stomach bleeding. Do not take other nonsteroidal anti-inflammatory medications with this such as Advil, Motrin, Aleve, Mobic, Goodie Powder, or Motrin.    - Robaxin is the muscle relaxer I have prescribed, this is meant to help with muscle tightness. Be aware that this medication may make you drowsy therefore the first time you take this it should be at a time you are in an environment where you can rest. Do not drive or operate heavy machinery when taking this medication. Do not drink alcohol or take other sedating medications with this medicine such as narcotics or benzodiazepines.   You make take Tylenol per over the counter dosing with these medications.   We have prescribed you new medication(s) today. Discuss the medications prescribed today with your pharmacist as they can have adverse effects and interactions with your other medicines including over the counter and prescribed medications. Seek medical evaluation if you start to experience new or abnormal symptoms after taking one of these medicines, seek care immediately if you start to experience difficulty breathing, feeling of your throat closing, facial swelling, or rash as these could be indications of a more serious allergic  reaction      We would like you to follow up closely with your primary care provider and/or the cardiologist provided in your discharge instructions within 1-3 days. Return to the ER immediately should you experience any new or worsening symptoms including but not limited to return of pain, worsened pain, vomiting, shortness of breath, dizziness, lightheadedness, passing out, or any other concerns that you may have.

## 2020-01-03 ENCOUNTER — Other Ambulatory Visit: Payer: Self-pay | Admitting: Student in an Organized Health Care Education/Training Program

## 2020-01-03 ENCOUNTER — Other Ambulatory Visit: Payer: Self-pay

## 2020-01-03 ENCOUNTER — Ambulatory Visit (INDEPENDENT_AMBULATORY_CARE_PROVIDER_SITE_OTHER): Payer: Medicaid Other | Admitting: Family Medicine

## 2020-01-03 ENCOUNTER — Encounter: Payer: Self-pay | Admitting: Family Medicine

## 2020-01-03 VITALS — BP 128/74 | HR 67 | Wt 258.0 lb

## 2020-01-03 DIAGNOSIS — N939 Abnormal uterine and vaginal bleeding, unspecified: Secondary | ICD-10-CM | POA: Diagnosis not present

## 2020-01-03 LAB — POCT HEMOGLOBIN: Hemoglobin: 10.8 g/dL — AB (ref 11–14.6)

## 2020-01-03 MED ORDER — BLISOVI 24 FE 1-20 MG-MCG(24) PO TABS: 1.0000 | ORAL_TABLET | Freq: Every day | ORAL | 3 refills | Status: DC

## 2020-01-03 NOTE — Patient Instructions (Addendum)
Thank you for coming to see me today. It was a pleasure! Today we talked about:   We will get an ultrasound to be sure nothing else is causing your irregular bleeding.  I have sent a contraceptive pill to the pharmacy to help with your bleeding. Congratulations on stopping smoking, please do not start back as that can increase your risk your blood clots.   Please follow-up with your primary care provider in 2-3 weeks or sooner as needed.  If you have any questions or concerns, please do not hesitate to call the office at [phone].  Take Care,   [place] Cou[place], DO

## 2020-01-03 NOTE — Progress Notes (Signed)
   SUBJECTIVE:   CHIEF COMPLAINT / HPI:   Abnormal uterine bleeding: Patient reports that she has been on her period for the past 2 and half weeks.  She states that it has been intermittently heavy but is usually spotting.  She states that usually her period only last 3 days and has been pretty regular until this episode.  She denies any new medications or change in medications.  She reports she is not currently on any birth control but has not been sexually active since October.  She denies any vaginal discharge.  She also denies any pelvic or abdominal pain.  Patient states that she was told to try ibuprofen in order to decrease her bleeding but states it did not help her.  She does also denies any lightheadedness or dizziness, palpitations, SOB, or chest pain.  PERTINENT  PMH / PSH: HTN, GERD  OBJECTIVE:  BP 128/74   Pulse 67   Wt 258 lb (117 kg)   LMP 12/27/2019   SpO2 99%   BMI 37.55 kg/m   General: NAD, pleasant Neck: Supple Cardiovascular: RRR, no m/r/g, no LE edema Respiratory: normal work of breathing Psych: AOx3, appropriate affect  ASSESSMENT/PLAN:   Abnormal uterine bleeding (AUB) Patient now on period for 2-1/2 weeks.  Ibuprofen did not help.  Patient denying any symptoms of anemia.  Given age and history patient to be further evaluated with pelvic ultrasound.  Also discussed different options and patient agreed to try OCPs in order to decrease bleeding.  Patient has recently stopped smoking and congratulated her and encouraged her to not restart as it increases risk of blood clots. Patient to follow-up in 2 to 3 weeks.  She is to follow-up sooner if her bleeding increases or does not resolve and may require endometrial biopsy at that time. Hemoglobin in office 10.8.  Patient advised to take iron supplementation every other day.    [place] Jazmin Ley, DO PGY-3, [facility]

## 2020-01-08 ENCOUNTER — Encounter: Payer: Self-pay | Admitting: Family Medicine

## 2020-01-08 MED ORDER — FERROUS SULFATE 325 (65 FE) MG PO TABS: 325.0000 mg | ORAL_TABLET | ORAL | 3 refills | Status: AC

## 2020-01-08 NOTE — Assessment & Plan Note (Signed)
Patient now on period for 2-1/2 weeks.  Ibuprofen did not help.  Patient denying any symptoms of anemia.  Given age and history patient to be further evaluated with pelvic ultrasound.  Also discussed different options and patient agreed to try OCPs in order to decrease bleeding.  Patient has recently stopped smoking and congratulated her and encouraged her to not restart as it increases risk of blood clots. Patient to follow-up in 2 to 3 weeks.  She is to follow-up sooner if her bleeding increases or does not resolve and may require endometrial biopsy at that time. Hemoglobin in office 10.8.  Patient advised to take iron supplementation every other day.

## 2020-01-09 ENCOUNTER — Ambulatory Visit: Payer: Medicaid Other

## 2020-01-09 DIAGNOSIS — F3181 Bipolar II disorder: Secondary | ICD-10-CM | POA: Diagnosis not present

## 2020-01-10 ENCOUNTER — Encounter: Payer: Self-pay | Admitting: Family Medicine

## 2020-01-16 ENCOUNTER — Ambulatory Visit
Admission: RE | Admit: 2020-01-16 | Discharge: 2020-01-16 | Disposition: A | Discharge status: Home / Self Care | Payer: Medicaid Other | Source: Ambulatory Visit | Attending: Family Medicine | Admitting: Family Medicine

## 2020-01-16 DIAGNOSIS — D251 Intramural leiomyoma of uterus: Secondary | ICD-10-CM | POA: Diagnosis not present

## 2020-01-16 DIAGNOSIS — N939 Abnormal uterine and vaginal bleeding, unspecified: Secondary | ICD-10-CM

## 2020-01-20 ENCOUNTER — Ambulatory Visit (HOSPITAL_COMMUNITY)
Admission: EM | Admit: 2020-01-20 | Discharge: 2020-01-20 | Disposition: A | Discharge status: Home / Self Care | Payer: Medicaid Other | Attending: Physician Assistant | Admitting: Physician Assistant

## 2020-01-20 ENCOUNTER — Encounter (HOSPITAL_COMMUNITY): Payer: Self-pay

## 2020-01-20 ENCOUNTER — Other Ambulatory Visit: Payer: Self-pay

## 2020-01-20 DIAGNOSIS — M542 Cervicalgia: Secondary | ICD-10-CM

## 2020-01-20 DIAGNOSIS — M5412 Radiculopathy, cervical region: Secondary | ICD-10-CM | POA: Diagnosis not present

## 2020-01-20 MED ORDER — NAPROXEN 500 MG PO TABS: 500.0000 mg | ORAL_TABLET | Freq: Two times a day (BID) | ORAL | 0 refills | Status: DC

## 2020-01-20 NOTE — ED Triage Notes (Signed)
Pt c/o 8/10 sharp pain in neck with movement. Pt states she woke up with the pain 2 days ago. Pt c/o numbness in left arm. Pt able to move all extremities.

## 2020-01-20 NOTE — ED Provider Notes (Signed)
[place]    CSN: [id number] Arrival date & time: 01/20/20  1320      History   Chief Complaint Chief Complaint  Patient presents with  . Neck Pain    HPI [name] is a 44 y.o. female.   Patient with history of cervical radiculopathy presents for 2-day history of left-sided neck and upper shoulder pain.  She reports she woke up a couple days ago with left-sided neck pain shooting down and back of her arm.  She reports numbness and paresthesias when laying certain positions.  Denies weakness.  She reports this clears up after shaking out.  She reports this is happened before and was seen in March for something similar.  She reports she has tried NSAIDs and other muscle relaxers with mild relief.  She denies known injury to head or neck.     Past Medical History:  Diagnosis Date  . Hypertension   . Migraine   . Vertigo     Patient Active Problem List   Diagnosis Date Noted  . Cervical radiculopathy 11/07/2019  . Bipolar 2 disorder ([place]) 03/25/2016  . Vertigo 07/29/2014  . Abnormal uterine bleeding (AUB) 11/26/2013  . Essential hypertension, benign 09/27/2013  . GERD (gastroesophageal reflux disease) 05/02/2013  . Chronic lumbar pain 10/02/2012  . Migraine without aura 07/05/2012  . Anxiety state 08/18/2010  . TOBACCO ABUSE 08/18/2010  . OBESITY NOS 01/26/2007    History reviewed. No pertinent surgical history.  OB History   No obstetric history on file.      Home Medications    Prior to Admission medications   Medication Sig Start Date End Date Taking? Authorizing Provider  docusate sodium (COLACE) 100 MG capsule Take 1 capsule (100 mg total) by mouth 2 (two) times daily. Patient taking differently: Take 100 mg by mouth 2 (two) times daily as needed for mild constipation.  12/06/18   Anderson, Chelsey L, DO  EPINEPHrine 0.3 mg/0.3 mL IJ SOAJ injection Inject 0.3 mLs (0.3 mg total) into the muscle as needed for anaphylaxis. 04/05/19    Martyn Malay, MD  ferrous sulfate (FERROUSUL) 325 (65 FE) MG tablet Take 1 tablet (325 mg total) by mouth every other day. 01/08/20   Shirley, [place], DO  fluticasone (FLONASE) 50 MCG/ACT nasal spray Place 2 sprays into both nostrils daily. Patient taking differently: Place 2 sprays into both nostrils daily as needed for allergies.  11/28/18   Anderson, Chelsey L, DO  hydrochlorothiazide (MICROZIDE) 12.5 MG capsule Take 1 capsule (12.5 mg total) by mouth daily. 08/02/19   Anderson, Chelsey L, DO  hydrOXYzine (ATARAX/VISTARIL) 10 MG tablet Take 1 tablet (10 mg total) by mouth 3 (three) times daily as needed. Patient taking differently: Take 10 mg by mouth 3 (three) times daily as needed for itching or anxiety.  02/12/19   Lovenia Kim, MD  loratadine (CLARITIN) 10 MG tablet Take 1 tablet (10 mg total) by mouth daily. 03/28/19   Anderson, Chelsey L, DO  meclizine (ANTIVERT) 12.5 MG tablet Take 1 tablet (12.5 mg total) by mouth 3 (three) times daily as needed for dizziness. 11/05/19   Benay Pike, MD  methocarbamol (ROBAXIN) 500 MG tablet Take 1 tablet (500 mg total) by mouth every 8 (eight) hours as needed for muscle spasms. 12/31/19   Petrucelli, Samantha R, PA-C  montelukast (SINGULAIR) 10 MG tablet Take 1 tablet (10 mg total) by mouth at bedtime. 11/02/17   Mikell, Jeani Sow, MD  naproxen (NAPROSYN) 500 MG tablet  Take 1 tablet (500 mg total) by mouth 2 (two) times daily. 01/20/20   Masey Scheiber, Veryl Speak, PA-C  Norethindrone Acetate-Ethinyl Estrad-FE (BLISOVI 24 FE) 1-20 MG-MCG(24) tablet Take 1 tablet by mouth daily. 01/03/20   Shirley, [place], DO  omeprazole (PRILOSEC) 20 MG capsule TAKE 1 CAPSULE BY MOUTH EVERY DAY 01/06/20   Jamelle Rushing L, DO  Oxcarbazepine (TRILEPTAL) 300 MG tablet Take 300 mg by mouth at bedtime.     [provider]  polyethylene glycol (MIRALAX / GLYCOLAX) 17 g packet Take 17 g by mouth 3 (three) times daily. Until stooling regularly Patient taking differently: Take 17 g  by mouth daily. Until stooling regularly 03/28/19   Jamelle Rushing L, DO  propranolol (INDERAL) 20 MG tablet Take 1 tablet (20 mg total) by mouth 3 (three) times daily. Patient taking differently: Take 20 mg by mouth 3 (three) times daily as needed (headaches).  01/04/18   Mikell, Antionette Poles, MD  sucralfate (CARAFATE) 1 GM/10ML suspension Take 10 mLs (1 g total) by mouth 4 (four) times daily -  with meals and at bedtime. 08/02/19   Anderson, Chelsey L, DO  SUMAtriptan (IMITREX) 50 MG tablet TAKE 1 TABLET BY MOUTH FOR HEADACHE. MAY REPEAT IN 2 HOURS IF HEADACHE PERSIST Patient taking differently: Take 50 mg by mouth every 2 (two) hours as needed for migraine. TAKE 1 TABLET BY MOUTH FOR HEADACHE. MAY REPEAT IN 2 HOURS IF HEADACHE PERSIST 11/28/18   Jamelle Rushing L, DO  tiZANidine (ZANAFLEX) 4 MG capsule Take 1 capsule (4 mg total) by mouth 2 (two) times daily as needed (for migraine, NOT BEFORE DRIVING). Do not take with flexeril 05/31/18   Howard Pouch, MD    Family History Family History  Problem Relation Age of Onset  . Hypertension Mother   . Diabetes Mother     Social History Social History   Tobacco Use  . Smoking status: Former Smoker    Packs/day: 0.50    Years: 19.00    Pack years: 9.50    Types: Cigarettes    Quit date: 09/23/2019    Years since quitting: 0.3  . Smokeless tobacco: Never Used  Substance Use Topics  . Alcohol use: No  . Drug use: No     Allergies   Aripiprazole, Escitalopram oxalate, Latex, and Shellfish allergy   Review of Systems Review of Systems   Physical Exam Triage Vital Signs ED Triage Vitals  Enc Vitals Group     BP 01/20/20 1417 137/79     Pulse Rate 01/20/20 1417 73     Resp 01/20/20 1417 18     Temp 01/20/20 1417 98.6 F (37 C)     Temp Source 01/20/20 1417 Oral     SpO2 01/20/20 1417 100 %     Weight 01/20/20 1420 260 lb (117.9 kg)     Height 01/20/20 1420 5\' 9"  (1.753 m)     Head Circumference --      Peak Flow --       Pain Score 01/20/20 1420 8     Pain Loc --      Pain Edu? --      Excl. in GC? --    No data found.  Updated Vital Signs BP 137/79   Pulse 73   Temp 98.6 F (37 C) (Oral)   Resp 18   Ht 5\' 9"  (1.753 m)   Wt 260 lb (117.9 kg)   LMP 12/27/2019   SpO2 100%   BMI 38.40  kg/m   Visual Acuity Right Eye Distance:   Left Eye Distance:   Bilateral Distance:    Right Eye Near:   Left Eye Near:    Bilateral Near:     Physical Exam Vitals and nursing note reviewed.  Constitutional:      General: She is not in acute distress.    Appearance: She is well-developed. She is not ill-appearing.  HENT:     Head: Normocephalic and atraumatic.  Eyes:     Conjunctiva/sclera: Conjunctivae normal.  Cardiovascular:     Rate and Rhythm: Normal rate and regular rhythm.     Heart sounds: No murmur.  Pulmonary:     Effort: Pulmonary effort is normal. No respiratory distress.     Breath sounds: Normal breath sounds.  Musculoskeletal:     Cervical back: Normal range of motion and neck supple. Tenderness (Left-sided paraspinal tenderness C3-C6.  No midline tenderness) present. No rigidity.  Skin:    General: Skin is warm and dry.  Neurological:     Mental Status: She is alert and oriented to person, place, and time.     Cranial Nerves: No cranial nerve deficit.     Comments: Mildly decreased sensation in the left hand versus right, along fifth digit fourth digit dermatomes.  Strength equal bilaterally 5/5 in the upper extremities.      UC Treatments / Results  Labs (all labs ordered are listed, but only abnormal results are displayed) Labs Reviewed - No data to display  EKG   Radiology No results found.  Procedures Procedures (including critical care time)  Medications Ordered in UC Medications - No data to display  Initial Impression / Assessment and Plan / UC Course  I have reviewed the triage vital signs and the nursing notes.  Pertinent labs & imaging results that were  available during my care of the patient were reviewed by me and considered in my medical decision making (see chart for details).     #Neck pain #Cervical radiculopathy Patient is a 44 year old with history of cervical radiculopathy presenting with same.  No alarm symptoms today.  Will recommend naproxen and continue the methocarbamol that she was previously prescribed.  Topical options were also discussed.  We will have her follow-up with sports medicine group for further evaluation.  Return emergency department cautions were discussed with patient.  She verbalized understanding this. Final Clinical Impressions(s) / UC Diagnoses   Final diagnoses:  Cervical radiculopathy  Neck pain     Discharge Instructions     Take the naproxen twice a day Take the methocarbamol previously prescribed to you as prescribed.  Do not drive, drink alcohol or operate machinery after taking this as it will make you sleepy.  Take this primarily at night  Apply topical lidocaine's, palms and you may consider over-the-counter Voltaren gel.  Apply warm compresses and heat as tolerated  Schedule follow-up with the sports medicine group.       ED Prescriptions    Medication Sig Dispense Auth. Provider   naproxen (NAPROSYN) 500 MG tablet Take 1 tablet (500 mg total) by mouth 2 (two) times daily. 10 tablet Chaney Maclaren, Veryl Speak, PA-C     PDMP not reviewed this encounter.   Hermelinda Medicus, PA-C 01/20/20 2049

## 2020-01-20 NOTE — Discharge Instructions (Signed)
Take the naproxen twice a day Take the methocarbamol previously prescribed to you as prescribed.  Do not drive, drink alcohol or operate machinery after taking this as it will make you sleepy.  Take this primarily at night  Apply topical lidocaine's, palms and you may consider over-the-counter Voltaren gel.  Apply warm compresses and heat as tolerated  Schedule follow-up with the sports medicine group.

## 2020-01-23 DIAGNOSIS — F438 Other reactions to severe stress: Secondary | ICD-10-CM | POA: Diagnosis not present

## 2020-01-23 DIAGNOSIS — F3132 Bipolar disorder, current episode depressed, moderate: Secondary | ICD-10-CM | POA: Diagnosis not present

## 2020-01-29 DIAGNOSIS — F3132 Bipolar disorder, current episode depressed, moderate: Secondary | ICD-10-CM | POA: Diagnosis not present

## 2020-01-29 DIAGNOSIS — F438 Other reactions to severe stress: Secondary | ICD-10-CM | POA: Diagnosis not present

## 2020-01-30 DIAGNOSIS — F3132 Bipolar disorder, current episode depressed, moderate: Secondary | ICD-10-CM | POA: Diagnosis not present

## 2020-01-30 DIAGNOSIS — F438 Other reactions to severe stress: Secondary | ICD-10-CM | POA: Diagnosis not present

## 2020-02-06 DIAGNOSIS — F438 Other reactions to severe stress: Secondary | ICD-10-CM | POA: Diagnosis not present

## 2020-02-06 DIAGNOSIS — F3132 Bipolar disorder, current episode depressed, moderate: Secondary | ICD-10-CM | POA: Diagnosis not present

## 2020-02-10 ENCOUNTER — Ambulatory Visit: Payer: Medicaid Other

## 2020-02-13 DIAGNOSIS — F438 Other reactions to severe stress: Secondary | ICD-10-CM | POA: Diagnosis not present

## 2020-02-13 DIAGNOSIS — F3132 Bipolar disorder, current episode depressed, moderate: Secondary | ICD-10-CM | POA: Diagnosis not present

## 2020-02-15 ENCOUNTER — Other Ambulatory Visit: Payer: Self-pay

## 2020-02-15 ENCOUNTER — Encounter: Payer: Self-pay | Admitting: Student in an Organized Health Care Education/Training Program

## 2020-02-15 ENCOUNTER — Ambulatory Visit: Payer: Medicaid Other | Attending: Internal Medicine

## 2020-02-15 DIAGNOSIS — Z23 Encounter for immunization: Secondary | ICD-10-CM

## 2020-02-15 NOTE — Progress Notes (Signed)
   Covid-19 Vaccination Clinic  Name:  [name]    MRN: [id number] DOB: 01-16-1976  02/15/2020  [name] was observed post Covid-19 immunization for 15 minutes without incident. She was provided with Vaccine Information Sheet and instruction to access the V-Safe system.   [name] was instructed to call 911 with any severe reactions post vaccine: Marland Kitchen Difficulty breathing  . Swelling of face and throat  . A fast heartbeat  . A bad rash all over body  . Dizziness and weakness   Immunizations Administered    Name Date Dose VIS Date Route   Pfizer COVID-19 Vaccine 02/15/2020 10:53 AM 0.3 mL 10/16/2018 Intramuscular   Manufacturer: [employer], [employer]   Lot: [id number]   NDC: [address]-4

## 2020-02-20 DIAGNOSIS — F438 Other reactions to severe stress: Secondary | ICD-10-CM | POA: Diagnosis not present

## 2020-02-20 DIAGNOSIS — F3132 Bipolar disorder, current episode depressed, moderate: Secondary | ICD-10-CM | POA: Diagnosis not present

## 2020-02-25 ENCOUNTER — Other Ambulatory Visit: Payer: Self-pay | Admitting: Student in an Organized Health Care Education/Training Program

## 2020-02-25 DIAGNOSIS — G43009 Migraine without aura, not intractable, without status migrainosus: Secondary | ICD-10-CM

## 2020-02-26 NOTE — Telephone Encounter (Signed)
LVM for patient to call and schedule an appointment.  .Katerina Zurn R Oluwatamilore Starnes, CMA  

## 2020-02-27 DIAGNOSIS — F438 Other reactions to severe stress: Secondary | ICD-10-CM | POA: Diagnosis not present

## 2020-02-27 DIAGNOSIS — F3132 Bipolar disorder, current episode depressed, moderate: Secondary | ICD-10-CM | POA: Diagnosis not present

## 2020-03-04 DIAGNOSIS — F3132 Bipolar disorder, current episode depressed, moderate: Secondary | ICD-10-CM | POA: Diagnosis not present

## 2020-03-04 DIAGNOSIS — F438 Other reactions to severe stress: Secondary | ICD-10-CM | POA: Diagnosis not present

## 2020-03-06 DIAGNOSIS — F438 Other reactions to severe stress: Secondary | ICD-10-CM | POA: Diagnosis not present

## 2020-03-06 DIAGNOSIS — F3132 Bipolar disorder, current episode depressed, moderate: Secondary | ICD-10-CM | POA: Diagnosis not present

## 2020-03-07 ENCOUNTER — Ambulatory Visit: Payer: Medicaid Other | Attending: Internal Medicine

## 2020-03-07 DIAGNOSIS — Z23 Encounter for immunization: Secondary | ICD-10-CM

## 2020-03-07 NOTE — Progress Notes (Signed)
   Covid-19 Vaccination Clinic  Name:  [name]    MRN: [id number] DOB: Mar 16, 1976  03/07/2020  [name] was observed post Covid-19 immunization for 15 minutes without incident. She was provided with Vaccine Information Sheet and instruction to access the V-Safe system.   [name] was instructed to call 911 with any severe reactions post vaccine: Marland Kitchen Difficulty breathing  . Swelling of face and throat  . A fast heartbeat  . A bad rash all over body  . Dizziness and weakness   Immunizations Administered    Name Date Dose VIS Date Route   Pfizer COVID-19 Vaccine 03/07/2020 10:29 AM 0.3 mL 10/16/2018 Intramuscular   Manufacturer: [employer], [employer]   Lot: [id number]   NDC: [address]-7

## 2020-03-19 ENCOUNTER — Other Ambulatory Visit: Payer: Self-pay

## 2020-03-23 MED ORDER — HYDROXYZINE HCL 10 MG PO TABS: 10.0000 mg | ORAL_TABLET | Freq: Three times a day (TID) | ORAL | 0 refills | Status: DC | PRN

## 2020-03-26 DIAGNOSIS — F438 Other reactions to severe stress: Secondary | ICD-10-CM | POA: Diagnosis not present

## 2020-03-26 DIAGNOSIS — F3132 Bipolar disorder, current episode depressed, moderate: Secondary | ICD-10-CM | POA: Diagnosis not present

## 2020-04-09 ENCOUNTER — Other Ambulatory Visit: Payer: Self-pay | Admitting: Student in an Organized Health Care Education/Training Program

## 2020-04-09 DIAGNOSIS — F3132 Bipolar disorder, current episode depressed, moderate: Secondary | ICD-10-CM | POA: Diagnosis not present

## 2020-04-09 DIAGNOSIS — F438 Other reactions to severe stress: Secondary | ICD-10-CM | POA: Diagnosis not present

## 2020-04-15 DIAGNOSIS — F3132 Bipolar disorder, current episode depressed, moderate: Secondary | ICD-10-CM | POA: Diagnosis not present

## 2020-04-15 DIAGNOSIS — F438 Other reactions to severe stress: Secondary | ICD-10-CM | POA: Diagnosis not present

## 2020-04-24 DIAGNOSIS — F438 Other reactions to severe stress: Secondary | ICD-10-CM | POA: Diagnosis not present

## 2020-04-24 DIAGNOSIS — F3132 Bipolar disorder, current episode depressed, moderate: Secondary | ICD-10-CM | POA: Diagnosis not present

## 2020-04-30 DIAGNOSIS — F438 Other reactions to severe stress: Secondary | ICD-10-CM | POA: Diagnosis not present

## 2020-04-30 DIAGNOSIS — F3132 Bipolar disorder, current episode depressed, moderate: Secondary | ICD-10-CM | POA: Diagnosis not present

## 2020-05-07 DIAGNOSIS — F3132 Bipolar disorder, current episode depressed, moderate: Secondary | ICD-10-CM | POA: Diagnosis not present

## 2020-05-07 DIAGNOSIS — F438 Other reactions to severe stress: Secondary | ICD-10-CM | POA: Diagnosis not present

## 2020-05-12 ENCOUNTER — Encounter (HOSPITAL_COMMUNITY): Payer: Self-pay

## 2020-05-12 ENCOUNTER — Other Ambulatory Visit: Payer: Self-pay

## 2020-05-12 ENCOUNTER — Telehealth (INDEPENDENT_AMBULATORY_CARE_PROVIDER_SITE_OTHER): Payer: Medicaid Other | Admitting: Psychiatry

## 2020-05-12 DIAGNOSIS — F3181 Bipolar II disorder: Secondary | ICD-10-CM

## 2020-05-12 DIAGNOSIS — F411 Generalized anxiety disorder: Secondary | ICD-10-CM

## 2020-05-12 DIAGNOSIS — F4310 Post-traumatic stress disorder, unspecified: Secondary | ICD-10-CM | POA: Diagnosis not present

## 2020-05-12 MED ORDER — OXCARBAZEPINE 300 MG PO TABS: 300.0000 mg | ORAL_TABLET | Freq: Every day | ORAL | 1 refills | Status: DC

## 2020-05-12 MED ORDER — TRAZODONE HCL 50 MG PO TABS: 50.0000 mg | ORAL_TABLET | Freq: Every evening | ORAL | 1 refills | Status: DC | PRN

## 2020-05-12 NOTE — Patient Instructions (Signed)
Follow up in one month.

## 2020-05-13 ENCOUNTER — Encounter (HOSPITAL_COMMUNITY): Payer: Self-pay

## 2020-05-13 DIAGNOSIS — F4310 Post-traumatic stress disorder, unspecified: Secondary | ICD-10-CM | POA: Insufficient documentation
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
## 2020-05-14 DIAGNOSIS — F438 Other reactions to severe stress: Secondary | ICD-10-CM | POA: Diagnosis not present

## 2020-05-14 DIAGNOSIS — F3132 Bipolar disorder, current episode depressed, moderate: Secondary | ICD-10-CM | POA: Diagnosis not present

## 2020-05-21 DIAGNOSIS — F438 Other reactions to severe stress: Secondary | ICD-10-CM | POA: Diagnosis not present

## 2020-05-21 DIAGNOSIS — F3132 Bipolar disorder, current episode depressed, moderate: Secondary | ICD-10-CM | POA: Diagnosis not present

## 2020-05-28 DIAGNOSIS — F438 Other reactions to severe stress: Secondary | ICD-10-CM | POA: Diagnosis not present

## 2020-05-28 DIAGNOSIS — F3132 Bipolar disorder, current episode depressed, moderate: Secondary | ICD-10-CM | POA: Diagnosis not present

## 2020-06-02 DIAGNOSIS — F3132 Bipolar disorder, current episode depressed, moderate: Secondary | ICD-10-CM | POA: Diagnosis not present

## 2020-06-02 DIAGNOSIS — F438 Other reactions to severe stress: Secondary | ICD-10-CM | POA: Diagnosis not present

## 2020-06-09 ENCOUNTER — Telehealth (INDEPENDENT_AMBULATORY_CARE_PROVIDER_SITE_OTHER): Payer: Medicaid Other | Admitting: Psychiatry

## 2020-06-09 ENCOUNTER — Other Ambulatory Visit: Payer: Self-pay

## 2020-06-09 ENCOUNTER — Encounter (HOSPITAL_COMMUNITY): Payer: Self-pay

## 2020-06-09 DIAGNOSIS — F3181 Bipolar II disorder: Secondary | ICD-10-CM

## 2020-06-09 MED ORDER — OXCARBAZEPINE 300 MG PO TABS: 300.0000 mg | ORAL_TABLET | Freq: Two times a day (BID) | ORAL | 0 refills | Status: DC

## 2020-06-09 NOTE — Progress Notes (Signed)
[facility] MD/PA/NP OP Progress Note  06/09/2020 3:13 PM [name]  MRN:  [id number]  Chief Complaint:  Follow up  Chief Complaint    Medication Reaction; Follow-up     HPI: 44 year old female seen via video visit today with this provider. She states that she "has to be honest, I haven't taken the medication from last time", as she has been very busy with a recent move. She states that she was only able to fill her trazodone from her last visit, but still needs her trileptal from the pharmacy. She states that she is "doing really well otherwise" and denies any SI/HI/AVH.   Visit Diagnosis:  Bipolar d/o  Past Psychiatric History: Bipolar II  Past Medical History:  Past Medical History:  Diagnosis Date  . Depression   . Hypertension   . Migraine   . PTSD (post-traumatic stress disorder)   . Vertigo    No past surgical history on file.  Family Psychiatric History: Not assessed   Family History:  Family History  Problem Relation Age of Onset  . Hypertension Mother   . Diabetes Mother     Social History:  Social History   Socioeconomic History  . Marital status: Single    Spouse name: Not on file  . Number of children: Not on file  . Years of education: Not on file  . Highest education level: Not on file  Occupational History  . Not on file  Tobacco Use  . Smoking status: Former Smoker    Packs/day: 0.50    Years: 19.00    Pack years: 9.50    Types: Cigarettes    Quit date: 09/23/2019    Years since quitting: 0.7  . Smokeless tobacco: Never Used  Vaping Use  . Vaping Use: Never used  Substance and Sexual Activity  . Alcohol use: No  . Drug use: No  . Sexual activity: Yes    Birth control/protection: None  Other Topics Concern  . Not on file  Social History Narrative  . Not on file   Social Determinants of Health   Financial Resource Strain:   . Difficulty of Paying Living Expenses: Not on file  Food Insecurity:   . Worried About Programme researcher, broadcasting/film/video in  the Last Year: Not on file  . Ran Out of Food in the Last Year: Not on file  Transportation Needs:   . Lack of Transportation (Medical): Not on file  . Lack of Transportation (Non-Medical): Not on file  Physical Activity:   . Days of Exercise per Week: Not on file  . Minutes of Exercise per Session: Not on file  Stress:   . Feeling of Stress : Not on file  Social Connections:   . Frequency of Communication with Friends and Family: Not on file  . Frequency of Social Gatherings with Friends and Family: Not on file  . Attends Religious Services: Not on file  . Active Member of Clubs or Organizations: Not on file  . Attends Banker Meetings: Not on file  . Marital Status: Not on file    Allergies:  Allergies  Allergen Reactions  . Aripiprazole     REACTION: Disoriented.  . Escitalopram Oxalate     REACTION: disoriented.  . Latex Itching  . Shellfish Allergy     Throat itching    Metabolic Disorder Labs: Lab Results  Component Value Date   HGBA1C 5.8 02/04/2011   MPG 126 02/28/2009   No results found  for: PROLACTIN Lab Results  Component Value Date   CHOL 137 08/02/2019   TRIG 131 08/02/2019   HDL 37 (L) 08/02/2019   CHOLHDL 3.7 08/02/2019   VLDL 12 02/04/2011   LDLCALC 77 08/02/2019   LDLCALC 74 02/04/2011   Lab Results  Component Value Date   TSH 2.260 08/02/2019   TSH 0.829 09/22/2015    Therapeutic Level Labs: No results found for: LITHIUM No results found for: VALPROATE No components found for:  CBMZ  Current Medications: Current Outpatient Medications  Medication Sig Dispense Refill  . docusate sodium (COLACE) 100 MG capsule Take 1 capsule (100 mg total) by mouth 2 (two) times daily. (Patient taking differently: Take 100 mg by mouth 2 (two) times daily as needed for mild constipation. ) 60 capsule 3  . EPINEPHrine 0.3 mg/0.3 mL IJ SOAJ injection Inject 0.3 mLs (0.3 mg total) into the muscle as needed for anaphylaxis. 1 each 0  . ferrous  sulfate (FERROUSUL) 325 (65 FE) MG tablet Take 1 tablet (325 mg total) by mouth every other day. 45 tablet 3  . fluticasone (FLONASE) 50 MCG/ACT nasal spray Place 2 sprays into both nostrils daily. (Patient taking differently: Place 2 sprays into both nostrils daily as needed for allergies. ) 16 g 6  . hydrochlorothiazide (MICROZIDE) 12.5 MG capsule Take 1 capsule (12.5 mg total) by mouth daily. 90 capsule 3  . hydrOXYzine (ATARAX/VISTARIL) 10 MG tablet Take 1 tablet (10 mg total) by mouth 3 (three) times daily as needed. 30 tablet 0  . loratadine (CLARITIN) 10 MG tablet Take 1 tablet (10 mg total) by mouth daily. 90 tablet 1  . meclizine (ANTIVERT) 12.5 MG tablet Take 1 tablet (12.5 mg total) by mouth 3 (three) times daily as needed for dizziness. 30 tablet 0  . methocarbamol (ROBAXIN) 500 MG tablet Take 1 tablet (500 mg total) by mouth every 8 (eight) hours as needed for muscle spasms. 15 tablet 0  . montelukast (SINGULAIR) 10 MG tablet Take 1 tablet (10 mg total) by mouth at bedtime. 30 tablet 3  . Norethindrone Acetate-Ethinyl Estrad-FE (BLISOVI 24 FE) 1-20 MG-MCG(24) tablet Take 1 tablet by mouth daily. 3 Package 3  . omeprazole (PRILOSEC) 20 MG capsule TAKE 1 CAPSULE BY MOUTH EVERY DAY 90 capsule 0  . Oxcarbazepine (TRILEPTAL) 300 MG tablet Take 1 tablet (300 mg total) by mouth 2 (two) times daily. 180 tablet 0  . polyethylene glycol (MIRALAX / GLYCOLAX) 17 g packet Take 17 g by mouth 3 (three) times daily. Until stooling regularly (Patient taking differently: Take 17 g by mouth daily. Until stooling regularly) 100 packet 2  . sucralfate (CARAFATE) 1 GM/10ML suspension Take 10 mLs (1 g total) by mouth 4 (four) times daily -  with meals and at bedtime. 420 mL 0  . tiZANidine (ZANAFLEX) 4 MG capsule Take 1 capsule (4 mg total) by mouth 2 (two) times daily as needed (for migraine, NOT BEFORE DRIVING). Do not take with flexeril 15 capsule 0  . traZODone (DESYREL) 50 MG tablet Take 1 tablet (50 mg  total) by mouth at bedtime as needed for sleep. May repeat once in an hour if needed 60 tablet 1   No current facility-administered medications for this visit.     Musculoskeletal: Strength & Muscle Tone: within normal limits Gait & Station: normal Patient leans: N/A  Psychiatric Specialty Exam: Review of Systems  Psychiatric/Behavioral: Negative for dysphoric mood, hallucinations, self-injury, sleep disturbance and suicidal ideas.  All other systems reviewed and  are negative.   There were no vitals taken for this visit.There is no height or weight on file to calculate BMI.  General Appearance: Casual and Fairly Groomed  Eye Contact:  Good  Speech:  Clear and Coherent  Volume:  Normal  Mood:  Euthymic  Affect:  Appropriate and Congruent  Thought Process:  Coherent and Linear  Orientation:  Full (Time, Place, and Person)  Thought Content: WDL and Logical   Suicidal Thoughts:  No  Homicidal Thoughts:  No  Memory:  Immediate;   Good Recent;   Good Remote;   Good  Judgement:  Intact  Insight:  Good  Psychomotor Activity:  Normal  Concentration:  Concentration: Good and Attention Span: Good  Recall:  Good  Fund of Knowledge: Good  Language: Good  Akathisia:  No  Handed:  Right  AIMS (if indicated): not done  Assets:  Communication Skills Desire for Improvement Physical Health  ADL's:  Intact  Cognition: WNL  Sleep:  Good   Screenings: PHQ2-9     Office Visit from 01/03/2020 in [facility] Office Visit from 02/12/2019 in [facility] Office Visit from 05/31/2018 in [facility] Office Visit from 01/17/2018 in [facility] Office Visit from 11/29/2017 in [facility]  PHQ-2 Total Score 1 0 0 0 0       Assessment and Plan:   Biploar II - Trileptal 300 mg bid - Follow up in 1 month   Insomnia - Continue trazodone 50 mg qhs PRN   Virtual Visit via Video  Note  I connected with Linward Headland on 06/09/20 at  3:00 PM EDT by a video enabled telemedicine application and verified that I am speaking with the correct person using two identifiers.  Location: Patient: Home Provider: [facility]   I discussed the limitations of evaluation and management by telemedicine and the availability of in person appointments. The patient expressed understanding and agreed to proceed.  Follow Up Instructions:  I discussed the assessment and treatment plan with the patient. The patient was provided an opportunity to ask questions and all were answered. The patient agreed with the plan and demonstrated an understanding of the instructions.   The patient was advised to call back or seek an in-person evaluation if the symptoms worsen or if the condition fails to improve as anticipated.  I provided 30 minutes of non-face-to-face time during this encounter.   Nanine Means, NP   Nanine Means, NP 06/09/2020, 3:13 PM

## 2020-06-12 DIAGNOSIS — F3132 Bipolar disorder, current episode depressed, moderate: Secondary | ICD-10-CM | POA: Diagnosis not present

## 2020-06-12 DIAGNOSIS — F438 Other reactions to severe stress: Secondary | ICD-10-CM | POA: Diagnosis not present

## 2020-06-19 DIAGNOSIS — F3132 Bipolar disorder, current episode depressed, moderate: Secondary | ICD-10-CM | POA: Diagnosis not present

## 2020-06-19 DIAGNOSIS — F438 Other reactions to severe stress: Secondary | ICD-10-CM | POA: Diagnosis not present

## 2020-06-22 ENCOUNTER — Ambulatory Visit (HOSPITAL_COMMUNITY)
Admission: EM | Admit: 2020-06-22 | Discharge: 2020-06-22 | Disposition: A | Discharge status: Home / Self Care | Payer: Medicaid Other

## 2020-06-22 ENCOUNTER — Other Ambulatory Visit: Payer: Self-pay

## 2020-06-22 NOTE — ED Notes (Signed)
Patient states she needs to leave for another appointment.  Will return another day.

## 2020-06-22 NOTE — ED Notes (Signed)
No answer when called in lobby for room assignment. No answer when called in parking lot

## 2020-06-22 NOTE — ED Notes (Signed)
No answer when pt called by D. Manson Passey for room assignment

## 2020-06-24 ENCOUNTER — Other Ambulatory Visit: Payer: Self-pay

## 2020-06-24 ENCOUNTER — Encounter (HOSPITAL_COMMUNITY): Payer: Self-pay

## 2020-06-24 ENCOUNTER — Ambulatory Visit (HOSPITAL_COMMUNITY)
Admission: EM | Admit: 2020-06-24 | Discharge: 2020-06-24 | Disposition: A | Discharge status: Home / Self Care | Payer: Medicaid Other | Attending: Family Medicine | Admitting: Family Medicine

## 2020-06-24 DIAGNOSIS — I10 Essential (primary) hypertension: Secondary | ICD-10-CM

## 2020-06-24 DIAGNOSIS — N939 Abnormal uterine and vaginal bleeding, unspecified: Secondary | ICD-10-CM

## 2020-06-24 MED ORDER — HYDROCHLOROTHIAZIDE 12.5 MG PO CAPS: 12.5000 mg | ORAL_CAPSULE | Freq: Every day | ORAL | 0 refills | Status: DC

## 2020-06-24 MED ORDER — MEGESTROL ACETATE 40 MG PO TABS
40.0000 mg | ORAL_TABLET | Freq: Two times a day (BID) | ORAL | 0 refills | Status: DC
Start: 2020-06-24 — End: 2020-07-03

## 2020-06-24 NOTE — Discharge Instructions (Addendum)
Stop the birth control that you are currently on.  Start Megace twice a day until you see the OB/GYN specialist.  Please call them today for follow up. I have put in referral Continue the iron.

## 2020-06-24 NOTE — ED Provider Notes (Addendum)
[facility]    CSN: [id number] Arrival date & time: 06/24/20  [id number]      History   Chief Complaint Chief Complaint  Patient presents with  . Vaginal Bleeding    HPI [name] is a 44 y.o. female.   Patient is a 44 year old female abnormal uterine bleeding.  This is a chronic issue for her.  She has known leiomyoma.  Has been on birth control for this bleeding in the past.  Also takes iron for anemia.  Recently started back on the birth control and iron due to bleeding that has been present since October. This has not stopped the bleeding like it normally does.  Reporting clots at times.  Denies any associated anemic type symptoms.  Does not currently have an OB/GYN.     Past Medical History:  Diagnosis Date  . Depression   . Hypertension   . Migraine   . PTSD (post-traumatic stress disorder)   . Vertigo     Patient Active Problem List   Diagnosis Date Noted  . PTSD (post-traumatic stress disorder) 05/13/2020  . Cervical radiculopathy 11/07/2019  . Bipolar 2 disorder (HCC) 03/25/2016  . Vertigo 07/29/2014  . Abnormal uterine bleeding (AUB) 11/26/2013  . Essential hypertension, benign 09/27/2013  . GERD (gastroesophageal reflux disease) 05/02/2013  . Chronic lumbar pain 10/02/2012  . Migraine without aura 07/05/2012  . Anxiety state 08/18/2010  . OBESITY NOS 01/26/2007    History reviewed. No pertinent surgical history.  OB History   No obstetric history on file.      Home Medications    Prior to Admission medications   Medication Sig Start Date End Date Taking? Authorizing Provider  docusate sodium (COLACE) 100 MG capsule Take 1 capsule (100 mg total) by mouth 2 (two) times daily. Patient taking differently: Take 100 mg by mouth 2 (two) times daily as needed for mild constipation.  12/06/18   Anderson, Chelsey L, DO  EPINEPHrine 0.3 mg/0.3 mL IJ SOAJ injection Inject 0.3 mLs (0.3 mg total) into the muscle as needed for anaphylaxis. 04/05/19    Westley Chandler, MD  ferrous sulfate (FERROUSUL) 325 (65 FE) MG tablet Take 1 tablet (325 mg total) by mouth every other day. 01/08/20   Shirley, [place], DO  fluticasone (FLONASE) 50 MCG/ACT nasal spray Place 2 sprays into both nostrils daily. Patient taking differently: Place 2 sprays into both nostrils daily as needed for allergies.  11/28/18   Anderson, Chelsey L, DO  hydrochlorothiazide (MICROZIDE) 12.5 MG capsule Take 1 capsule (12.5 mg total) by mouth daily. 06/24/20   Dahlia Byes A, NP  hydrOXYzine (ATARAX/VISTARIL) 10 MG tablet Take 1 tablet (10 mg total) by mouth 3 (three) times daily as needed. 03/23/20   Anderson, Chelsey L, DO  loratadine (CLARITIN) 10 MG tablet Take 1 tablet (10 mg total) by mouth daily. 03/28/19   Anderson, Chelsey L, DO  meclizine (ANTIVERT) 12.5 MG tablet Take 1 tablet (12.5 mg total) by mouth 3 (three) times daily as needed for dizziness. 11/05/19   Sandre Kitty, MD  megestrol (MEGACE) 40 MG tablet Take 1 tablet (40 mg total) by mouth 2 (two) times daily. 06/24/20   Dahlia Byes A, NP  methocarbamol (ROBAXIN) 500 MG tablet Take 1 tablet (500 mg total) by mouth every 8 (eight) hours as needed for muscle spasms. 12/31/19   Petrucelli, Samantha R, PA-C  montelukast (SINGULAIR) 10 MG tablet Take 1 tablet (10 mg total) by mouth at bedtime. 11/02/17   Mikell,  Antionette Poles, MD  omeprazole (PRILOSEC) 20 MG capsule TAKE 1 CAPSULE BY MOUTH EVERY DAY 04/10/20   Jamelle Rushing L, DO  Oxcarbazepine (TRILEPTAL) 300 MG tablet Take 1 tablet (300 mg total) by mouth 2 (two) times daily. 06/09/20   Charm Rings, NP  polyethylene glycol (MIRALAX / GLYCOLAX) 17 g packet Take 17 g by mouth 3 (three) times daily. Until stooling regularly Patient taking differently: Take 17 g by mouth daily. Until stooling regularly 03/28/19   Dareen Piano, Chelsey L, DO  sucralfate (CARAFATE) 1 GM/10ML suspension Take 10 mLs (1 g total) by mouth 4 (four) times daily -  with meals and at bedtime. 08/02/19   Anderson,  Chelsey L, DO  tiZANidine (ZANAFLEX) 4 MG capsule Take 1 capsule (4 mg total) by mouth 2 (two) times daily as needed (for migraine, NOT BEFORE DRIVING). Do not take with flexeril 05/31/18   Howard Pouch, MD  traZODone (DESYREL) 50 MG tablet Take 1 tablet (50 mg total) by mouth at bedtime as needed for sleep. May repeat once in an hour if needed 05/12/20   Charm Rings, NP    Family History Family History  Problem Relation Age of Onset  . Hypertension Mother   . Diabetes Mother   . Healthy Father     Social History Social History   Tobacco Use  . Smoking status: Former Smoker    Packs/day: 0.50    Years: 19.00    Pack years: 9.50    Types: Cigarettes    Quit date: 09/23/2019    Years since quitting: 0.7  . Smokeless tobacco: Never Used  Vaping Use  . Vaping Use: Never used  Substance Use Topics  . Alcohol use: No  . Drug use: No     Allergies   Aripiprazole, Escitalopram oxalate, Latex, and Shellfish allergy   Review of Systems Review of Systems   Physical Exam Triage Vital Signs ED Triage Vitals  Enc Vitals Group     BP 06/24/20 0827 (!) 141/85     Pulse Rate 06/24/20 0827 75     Resp 06/24/20 0827 18     Temp 06/24/20 0827 98.3 F (36.8 C)     Temp Source 06/24/20 0827 Oral     SpO2 06/24/20 0827 100 %     Weight --      Height --      Head Circumference --      Peak Flow --      Pain Score 06/24/20 0826 0     Pain Loc --      Pain Edu? --      Excl. in GC? --    No data found.  Updated Vital Signs BP (!) 141/85 (BP Location: Right Arm)   Pulse 75   Temp 98.3 F (36.8 C) (Oral)   Resp 18   LMP 05/28/2020 (Approximate)   SpO2 100%   Visual Acuity Right Eye Distance:   Left Eye Distance:   Bilateral Distance:    Right Eye Near:   Left Eye Near:    Bilateral Near:     Physical Exam Vitals and nursing note reviewed.  Constitutional:      General: She is not in acute distress.    Appearance: Normal appearance. She is not ill-appearing,  toxic-appearing or diaphoretic.  HENT:     Head: Normocephalic.     Nose: Nose normal.  Eyes:     Conjunctiva/sclera: Conjunctivae normal.  Pulmonary:     Effort: Pulmonary effort  is normal.  Musculoskeletal:        General: Normal range of motion.     Cervical back: Normal range of motion.  Skin:    General: Skin is warm and dry.     Findings: No rash.  Neurological:     Mental Status: She is alert.  Psychiatric:        Mood and Affect: Mood normal.      UC Treatments / Results  Labs (all labs ordered are listed, but only abnormal results are displayed) Labs Reviewed - No data to display  EKG   Radiology No results found.  Procedures Procedures (including critical care time)  Medications Ordered in UC Medications - No data to display  Initial Impression / Assessment and Plan / UC Course  I have reviewed the triage vital signs and the nursing notes.  Pertinent labs & imaging results that were available during my care of the patient were reviewed by me and considered in my medical decision making (see chart for details).     Abnormal uterine bleeding This is a chronic issue. Patient not currently having any concerning signs of anemia and she is taking iron every other day. Has been on birth control that was previously prescribed but this is not helping We will have her stop that birth control and start taking Megace twice a day until she is follows up with the OB/GYN specialist Referral put in for OB/GYN for follow-up Recommended continue the iron Follow up as needed for continued or worsening symptoms  Essential hypertension Refilled blood pressure medication as requested  Final Clinical Impressions(s) / UC Diagnoses   Final diagnoses:  Abnormal uterine bleeding (AUB)  Essential hypertension     Discharge Instructions     Stop the birth control that you are currently on.  Start Megace twice a day until you see the OB/GYN specialist.  Please call  them today for follow up. I have put in referral Continue the iron.     ED Prescriptions    Medication Sig Dispense Auth. Provider   megestrol (MEGACE) 40 MG tablet Take 1 tablet (40 mg total) by mouth 2 (two) times daily. 30 tablet Kelee Cunningham A, NP   hydrochlorothiazide (MICROZIDE) 12.5 MG capsule Take 1 capsule (12.5 mg total) by mouth daily. 30 capsule Onyx Edgley A, NP     PDMP not reviewed this encounter.   Janace Aris, NP 06/24/20 0814    Janace Aris, NP 06/24/20 [phone]

## 2020-06-24 NOTE — ED Triage Notes (Signed)
Pt is here with heavy vaginal bleeding, pt was seen 2 months ago at the ED & was given birth control & iron but has started back up a month ago.

## 2020-06-26 DIAGNOSIS — F3132 Bipolar disorder, current episode depressed, moderate: Secondary | ICD-10-CM | POA: Diagnosis not present

## 2020-06-26 DIAGNOSIS — F438 Other reactions to severe stress: Secondary | ICD-10-CM | POA: Diagnosis not present

## 2020-07-02 DIAGNOSIS — F3132 Bipolar disorder, current episode depressed, moderate: Secondary | ICD-10-CM | POA: Diagnosis not present

## 2020-07-02 DIAGNOSIS — F438 Other reactions to severe stress: Secondary | ICD-10-CM | POA: Diagnosis not present

## 2020-07-03 ENCOUNTER — Other Ambulatory Visit: Payer: Self-pay | Admitting: Student in an Organized Health Care Education/Training Program

## 2020-07-03 ENCOUNTER — Ambulatory Visit (INDEPENDENT_AMBULATORY_CARE_PROVIDER_SITE_OTHER): Payer: Medicaid Other | Admitting: Student in an Organized Health Care Education/Training Program

## 2020-07-03 ENCOUNTER — Other Ambulatory Visit: Payer: Self-pay

## 2020-07-03 VITALS — BP 125/76 | HR 97 | Ht 69.5 in | Wt 255.4 lb

## 2020-07-03 DIAGNOSIS — H65116 Acute and subacute allergic otitis media (mucoid) (sanguinous) (serous), recurrent, bilateral: Secondary | ICD-10-CM | POA: Diagnosis not present

## 2020-07-03 DIAGNOSIS — N939 Abnormal uterine and vaginal bleeding, unspecified: Secondary | ICD-10-CM | POA: Diagnosis not present

## 2020-07-03 DIAGNOSIS — D619 Aplastic anemia, unspecified: Secondary | ICD-10-CM | POA: Diagnosis not present

## 2020-07-03 MED ORDER — MEGESTROL ACETATE 40 MG PO TABS: 40.0000 mg | ORAL_TABLET | Freq: Two times a day (BID) | ORAL | 0 refills | Status: DC

## 2020-07-03 MED ORDER — HYDROCORTISONE 2.5 % EX OINT: TOPICAL_OINTMENT | CUTANEOUS | 0 refills | Status: DC

## 2020-07-03 NOTE — Assessment & Plan Note (Signed)
Likely endometrial hypertrophy vs fibroid bleeding. Pt has remote h/o D&C. Will need to r/o malignancy Trial of OCPs did not improve. Has been on magace since 11/3 with significant improvement but still has daily spotting.  [place] earlier this year showed small fibroid.  Patient has h/o migraines without aura, does not smoke, has no history of blood clots.  - continue magace short term. Refilled today - CBC today to monitor anemia - continue EOD iron tablet - schedule for soonest gyn clinic for endometrial bx - encouraged patient to contact insurance company for ob/gyn coverage and establish care

## 2020-07-03 NOTE — Patient Instructions (Signed)
It was a pleasure to see you today!  To summarize our discussion for this visit:  Please schedule earliest appointment for uterus biopsy here in our clinic.   Also, contact your insurance to find an OB/GYN for establishing care to follow up on your bleeding as we do not do procedures here to correct  You can continue to take magace for a short period of time.  We are checking for anemia today  Some additional health maintenance measures we should update are: Health Maintenance Due  Topic Date Due   Hepatitis C Screening  Never done   HIV Screening  Never done   INFLUENZA VACCINE  03/22/2020   PAP SMEAR-Modifier  05/29/2020      Call the clinic at [phone] if your symptoms worsen or you have any concerns.   Thank you for allowing me to take part in your care,  Dr. Jamelle Rushing

## 2020-07-03 NOTE — Progress Notes (Signed)
   SUBJECTIVE:   CHIEF COMPLAINT / HPI: vaginal bleeding  Chronic abnormal vaginal bleeding. Given megace on 11/3 at ED.  Changing pad about every 6 hours on megace. Which is a considerable improvement but patient unable to quantify her bleeding amount prior to starting the medication. Previous pelvic US positive for uterine fibroid. No family history of heavy bleeding.   Bilateral ear pain and itchiness. + Drain. Happens every season. Not using allergy medication. Uses Alcohol in ears which burns.  antihistamine and steroid for ears.   OBJECTIVE:   BP 125/76   Pulse 97   Ht 5' 9.5" (1.765 m)   Wt 255 lb 6.4 oz (115.8 kg)   SpO2 99%   BMI 37.18 kg/m   General: NAD, pleasant, able to participate in exam HEENT:  Head: [place]/AT.   Eyes:  EOMI, PERRL.   Ears:  External ears WNL, Bilateral TM's normal without retraction, redness or bulging. Abdomen: soft, nontender, nondistended, no hepatic or splenomegaly, +BS Skin: warm and dry, no rashes noted Neuro: alert and oriented, no focal deficits Psych: Normal affect and mood  ASSESSMENT/PLAN:   Abnormal uterine bleeding (AUB) Likely endometrial hypertrophy vs fibroid bleeding. Pt has remote h/o D&C. Will need to r/o malignancy Trial of OCPs did not improve. Has been on magace since 11/3 with significant improvement but still has daily spotting.  [place] earlier this year showed small fibroid.  Patient has h/o migraines without aura, does not smoke, has no history of blood clots.  - continue magace short term. 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 History suspicious for recurrent allergic symptoms Topical steroid cream prescribed Also recommend taking allergy medication such as zyrtec      Leeroy Bock, DO [facility]

## 2020-07-04 LAB — CBC
Hematocrit: 33.8 % — ABNORMAL LOW (ref 34.0–46.6)
Hemoglobin: 10.3 g/dL — ABNORMAL LOW (ref 11.1–15.9)
MCH: 22.1 pg — ABNORMAL LOW (ref 26.6–33.0)
MCHC: 30.5 g/dL — ABNORMAL LOW (ref 31.5–35.7)
MCV: 72 fL — ABNORMAL LOW (ref 79–97)
Platelets: 321 10*3/uL (ref 150–450)
RBC: 4.67 x10E6/uL (ref 3.77–5.28)
RDW: 15.9 % — ABNORMAL HIGH (ref 11.7–15.4)
WBC: 9.9 10*3/uL (ref 3.4–10.8)

## 2020-07-05 DIAGNOSIS — H6593 Unspecified nonsuppurative otitis media, bilateral: Secondary | ICD-10-CM | POA: Insufficient documentation

## 2020-07-05 NOTE — Assessment & Plan Note (Signed)
Exam negative for infection. History suspicious for recurrent allergic symptoms Topical steroid cream prescribed Also recommend taking allergy medication such as zyrtec

## 2020-07-07 ENCOUNTER — Other Ambulatory Visit: Payer: Self-pay

## 2020-07-07 ENCOUNTER — Telehealth (HOSPITAL_COMMUNITY): Payer: Medicaid Other

## 2020-07-23 ENCOUNTER — Ambulatory Visit: Payer: Medicaid Other

## 2020-07-23 DIAGNOSIS — F438 Other reactions to severe stress: Secondary | ICD-10-CM | POA: Diagnosis not present

## 2020-07-23 DIAGNOSIS — F3132 Bipolar disorder, current episode depressed, moderate: Secondary | ICD-10-CM | POA: Diagnosis not present

## 2020-07-24 ENCOUNTER — Other Ambulatory Visit: Payer: Self-pay | Admitting: Student in an Organized Health Care Education/Training Program

## 2020-07-24 DIAGNOSIS — I10 Essential (primary) hypertension: Secondary | ICD-10-CM

## 2020-07-24 DIAGNOSIS — J301 Allergic rhinitis due to pollen: Secondary | ICD-10-CM

## 2020-07-28 ENCOUNTER — Telehealth (HOSPITAL_COMMUNITY): Payer: Medicaid Other

## 2020-07-30 DIAGNOSIS — F3132 Bipolar disorder, current episode depressed, moderate: Secondary | ICD-10-CM | POA: Diagnosis not present

## 2020-07-30 DIAGNOSIS — F438 Other reactions to severe stress: Secondary | ICD-10-CM | POA: Diagnosis not present

## 2020-08-06 ENCOUNTER — Ambulatory Visit: Payer: Medicaid Other

## 2020-08-11 ENCOUNTER — Other Ambulatory Visit: Payer: Self-pay

## 2020-08-11 ENCOUNTER — Telehealth (INDEPENDENT_AMBULATORY_CARE_PROVIDER_SITE_OTHER): Payer: Medicaid Other | Admitting: Psychiatry

## 2020-08-11 DIAGNOSIS — F3181 Bipolar II disorder: Secondary | ICD-10-CM

## 2020-08-12 ENCOUNTER — Encounter (HOSPITAL_COMMUNITY): Payer: Self-pay

## 2020-08-12 MED ORDER — HYDROXYZINE HCL 10 MG PO TABS: 10.0000 mg | ORAL_TABLET | Freq: Three times a day (TID) | ORAL | 0 refills | Status: DC | PRN

## 2020-08-12 MED ORDER — OXCARBAZEPINE 300 MG PO TABS: 300.0000 mg | ORAL_TABLET | Freq: Two times a day (BID) | ORAL | 0 refills | Status: DC

## 2020-08-12 MED ORDER — TRAZODONE HCL 50 MG PO TABS: 50.0000 mg | ORAL_TABLET | Freq: Every evening | ORAL | 1 refills | Status: DC | PRN

## 2020-08-12 NOTE — Progress Notes (Signed)
BH MD/PA/NP OP Progress Note  08/12/2020 1:59 PM [name]  MRN:  [id number]  Chief Complaint:  Follow up  Chief Complaint    Depression; Follow-up; Anxiety     HPI: 44 year old female seen via video visit today with this provider. She states her medications are doing well for her.  She is experiencing a low level of depression and anxiety related to stress at work in the upcoming holidays.  Denies suicidal/homicidal ideations, mania symptoms, substance abuse.  No issues with sleep or appetite.  Stress increases her symptoms and relaxation decreases her symptoms along with medications.  Her symptoms are worse at work with stress during the day and improves in the evening at home.  Symptoms started in December.  Denies side effects from medications and feels they are working.  Visit Diagnosis:  Bipolar d/o  Past Psychiatric History: Bipolar II  Past Medical History:  Past Medical History:  Diagnosis Date   Depression    Hypertension    Migraine    PTSD (post-traumatic stress disorder)    Vertigo    History reviewed. No pertinent surgical history.  Family Psychiatric History: Not assessed   Family History:  Family History  Problem Relation Age of Onset   Hypertension Mother    Diabetes Mother    Healthy Father     Social History:  Social History   Socioeconomic History   Marital status: Single    Spouse name: Not on file   Number of children: Not on file   Years of education: Not on file   Highest education level: Not on file  Occupational History   Not on file  Tobacco Use   Smoking status: Former Smoker    Packs/day: 0.50    Years: 19.00    Pack years: 9.50    Types: Cigarettes    Quit date: 09/23/2019    Years since quitting: 0.8   Smokeless tobacco: Never Used  Vaping Use   Vaping Use: Never used  Substance and Sexual Activity   Alcohol use: No   Drug use: No   Sexual activity: Yes    Birth control/protection: None  Other  Topics Concern   Not on file  Social History Narrative   Not on file   Social Determinants of Health   Financial Resource Strain: Not on file  Food Insecurity: Not on file  Transportation Needs: Not on file  Physical Activity: Not on file  Stress: Not on file  Social Connections: Not on file    Allergies:  Allergies  Allergen Reactions   Aripiprazole     REACTION: Disoriented.   Escitalopram Oxalate     REACTION: disoriented.   Latex Itching   Shellfish Allergy     Throat itching    Metabolic Disorder Labs: Lab Results  Component Value Date   HGBA1C 5.8 02/04/2011   MPG 126 02/28/2009   No results found for: PROLACTIN Lab Results  Component Value Date   CHOL 137 08/02/2019   TRIG 131 08/02/2019   HDL 37 (L) 08/02/2019   CHOLHDL 3.7 08/02/2019   VLDL 12 02/04/2011   LDLCALC 77 08/02/2019   LDLCALC 74 02/04/2011   Lab Results  Component Value Date   TSH 2.260 08/02/2019   TSH 0.829 09/22/2015    Therapeutic Level Labs: No results found for: LITHIUM No results found for: VALPROATE No components found for:  CBMZ  Current Medications: Current Outpatient Medications  Medication Sig Dispense Refill   docusate sodium (COLACE)  100 MG capsule Take 1 capsule (100 mg total) by mouth 2 (two) times daily. (Patient taking differently: Take 100 mg by mouth 2 (two) times daily as needed for mild constipation. ) 60 capsule 3   EPINEPHrine 0.3 mg/0.3 mL IJ SOAJ injection Inject 0.3 mLs (0.3 mg total) into the muscle as needed for anaphylaxis. 1 each 0   ferrous sulfate (FERROUSUL) 325 (65 FE) MG tablet Take 1 tablet (325 mg total) by mouth every other day. 45 tablet 3   fluticasone (FLONASE) 50 MCG/ACT nasal spray Place 2 sprays into both nostrils daily. (Patient taking differently: Place 2 sprays into both nostrils daily as needed for allergies. ) 16 g 6   hydrochlorothiazide (MICROZIDE) 12.5 MG capsule TAKE 1 CAPSULE BY MOUTH EVERY DAY 60 capsule 0    hydrocortisone 2.5 % ointment Use two times daily and apply with q-tip to external ear canal 20 g 0   hydrOXYzine (ATARAX/VISTARIL) 10 MG tablet Take 1 tablet (10 mg total) by mouth 3 (three) times daily as needed. 30 tablet 0   loratadine (CLARITIN) 10 MG tablet TAKE 1 TABLET BY MOUTH EVERY DAY 90 tablet 0   meclizine (ANTIVERT) 12.5 MG tablet Take 1 tablet (12.5 mg total) by mouth 3 (three) times daily as needed for dizziness. 30 tablet 0   megestrol (MEGACE) 40 MG tablet Take 1 tablet (40 mg total) by mouth 2 (two) times daily. 30 tablet 0   methocarbamol (ROBAXIN) 500 MG tablet Take 1 tablet (500 mg total) by mouth every 8 (eight) hours as needed for muscle spasms. 15 tablet 0   montelukast (SINGULAIR) 10 MG tablet Take 1 tablet (10 mg total) by mouth at bedtime. 30 tablet 3   omeprazole (PRILOSEC) 20 MG capsule TAKE 1 CAPSULE BY MOUTH EVERY DAY 90 capsule 0   Oxcarbazepine (TRILEPTAL) 300 MG tablet Take 1 tablet (300 mg total) by mouth 2 (two) times daily. 180 tablet 0   polyethylene glycol (MIRALAX / GLYCOLAX) 17 g packet Take 17 g by mouth 3 (three) times daily. Until stooling regularly (Patient taking differently: Take 17 g by mouth daily. Until stooling regularly) 100 packet 2   sucralfate (CARAFATE) 1 GM/10ML suspension Take 10 mLs (1 g total) by mouth 4 (four) times daily -  with meals and at bedtime. 420 mL 0   tiZANidine (ZANAFLEX) 4 MG capsule Take 1 capsule (4 mg total) by mouth 2 (two) times daily as needed (for migraine, NOT BEFORE DRIVING). Do not take with flexeril 15 capsule 0   traZODone (DESYREL) 50 MG tablet Take 1 tablet (50 mg total) by mouth at bedtime as needed for sleep. May repeat once in an hour if needed 60 tablet 1   No current facility-administered medications for this visit.     Musculoskeletal: Strength & Muscle Tone: Unable to assess, telephone encounter Gait & Station: UTA Patient leans: UTA  Psychiatric Specialty Exam: Review of Systems   Psychiatric/Behavioral: Positive for dysphoric mood. Negative for hallucinations, self-injury, sleep disturbance and suicidal ideas. The patient is nervous/anxious.   All other systems reviewed and are negative.   There were no vitals taken for this visit.There is no height or weight on file to calculate BMI.  General Appearance: UTA  Eye Contact:  UTA  Speech:  Clear and Coherent  Volume:  Normal  Mood:  Low level of anxiety and depression  Affect:  UTA  Thought Process:  Coherent and Linear  Orientation:  Full (Time, Place, and Person)  Thought Content:  WDL and Logical   Suicidal Thoughts:  No  Homicidal Thoughts:  No  Memory:  Immediate;   Good Recent;   Good Remote;   Good  Judgement:  Intact  Insight:  Good  Psychomotor Activity:  Normal  Concentration:  Concentration: Good and Attention Span: Good  Recall:  Good  Fund of Knowledge: Good  Language: Good  Akathisia:  No  Handed:  Right  AIMS (if indicated): not done  Assets:  Communication Skills Desire for Improvement Physical Health  ADL's:  Intact  Cognition: WNL  Sleep:  Good   Screenings: PHQ2-9   Flowsheet Row Office Visit from 07/03/2020 in [facility] Office Visit from 01/03/2020 in [facility] Office Visit from 02/12/2019 in [facility] Office Visit from 05/31/2018 in [facility] Office Visit from 01/17/2018 in [facility]  PHQ-2 Total Score 2 1 0 0 0  PHQ-9 Total Score 6 -- -- -- --       Assessment and Plan:   Biploar II - Continue Trileptal 300 mg bid - Follow up in 2 months   Anxiety: -Continue hydroxyzine 10 mg TID PRN  Insomnia - Continue trazodone 50 mg qhs PRN   Virtual Visit via Telephone Note  I connected with [name] on 08/11/2020 at  2:00 PM EST by a video enabled telephone application and verified that I am speaking with the correct person using two  identifiers.  Location: Patient: Home Provider: home   I discussed the limitations of evaluation and management by telemedicine and the availability of in person appointments. The patient expressed understanding and agreed to proceed.  Follow Up Instructions:  I discussed the assessment and treatment plan with the patient. The patient was provided an opportunity to ask questions and all were answered. The patient agreed with the plan and demonstrated an understanding of the instructions.   The patient was advised to call back or seek an in-person evaluation if the symptoms worsen or if the condition fails to improve as anticipated.  I provided 20 minutes of non-face-to-face time during this encounter.   Nanine Means, NP   Nanine Means, NP 08/12/2020, 1:59 PM

## 2020-08-13 DIAGNOSIS — F3132 Bipolar disorder, current episode depressed, moderate: Secondary | ICD-10-CM | POA: Diagnosis not present

## 2020-08-13 DIAGNOSIS — F438 Other reactions to severe stress: Secondary | ICD-10-CM | POA: Diagnosis not present

## 2020-08-20 DIAGNOSIS — F3132 Bipolar disorder, current episode depressed, moderate: Secondary | ICD-10-CM | POA: Diagnosis not present

## 2020-08-20 DIAGNOSIS — F438 Other reactions to severe stress: Secondary | ICD-10-CM | POA: Diagnosis not present

## 2020-08-27 DIAGNOSIS — F3132 Bipolar disorder, current episode depressed, moderate: Secondary | ICD-10-CM | POA: Diagnosis not present

## 2020-08-27 DIAGNOSIS — F438 Other reactions to severe stress: Secondary | ICD-10-CM | POA: Diagnosis not present

## 2020-09-03 ENCOUNTER — Ambulatory Visit: Payer: Medicaid Other

## 2020-09-03 DIAGNOSIS — F438 Other reactions to severe stress: Secondary | ICD-10-CM | POA: Diagnosis not present

## 2020-09-03 DIAGNOSIS — F3132 Bipolar disorder, current episode depressed, moderate: Secondary | ICD-10-CM | POA: Diagnosis not present

## 2020-09-17 DIAGNOSIS — F3132 Bipolar disorder, current episode depressed, moderate: Secondary | ICD-10-CM | POA: Diagnosis not present

## 2020-09-17 DIAGNOSIS — F438 Other reactions to severe stress: Secondary | ICD-10-CM | POA: Diagnosis not present

## 2020-09-22 DIAGNOSIS — F3132 Bipolar disorder, current episode depressed, moderate: Secondary | ICD-10-CM | POA: Diagnosis not present

## 2020-09-22 DIAGNOSIS — F438 Other reactions to severe stress: Secondary | ICD-10-CM | POA: Diagnosis not present

## 2020-09-25 ENCOUNTER — Other Ambulatory Visit: Payer: Self-pay | Admitting: Student in an Organized Health Care Education/Training Program

## 2020-09-25 DIAGNOSIS — I10 Essential (primary) hypertension: Secondary | ICD-10-CM

## 2020-10-01 ENCOUNTER — Other Ambulatory Visit: Payer: Self-pay | Admitting: Student in an Organized Health Care Education/Training Program

## 2020-10-01 DIAGNOSIS — F3132 Bipolar disorder, current episode depressed, moderate: Secondary | ICD-10-CM | POA: Diagnosis not present

## 2020-10-01 DIAGNOSIS — F438 Other reactions to severe stress: Secondary | ICD-10-CM | POA: Diagnosis not present

## 2020-10-01 DIAGNOSIS — I10 Essential (primary) hypertension: Secondary | ICD-10-CM

## 2020-10-06 ENCOUNTER — Other Ambulatory Visit: Payer: Self-pay | Admitting: Student in an Organized Health Care Education/Training Program

## 2020-10-06 DIAGNOSIS — I10 Essential (primary) hypertension: Secondary | ICD-10-CM

## 2020-10-08 DIAGNOSIS — F3132 Bipolar disorder, current episode depressed, moderate: Secondary | ICD-10-CM | POA: Diagnosis not present

## 2020-10-08 DIAGNOSIS — F438 Other reactions to severe stress: Secondary | ICD-10-CM | POA: Diagnosis not present

## 2020-10-09 ENCOUNTER — Other Ambulatory Visit: Payer: Self-pay

## 2020-10-09 DIAGNOSIS — I10 Essential (primary) hypertension: Secondary | ICD-10-CM

## 2020-10-09 NOTE — Telephone Encounter (Signed)
Apt scheduled for 3/7 with PCP. Patient is out of blood pressure medication. Please advise.

## 2020-10-12 ENCOUNTER — Other Ambulatory Visit: Payer: Self-pay

## 2020-10-12 ENCOUNTER — Encounter (HOSPITAL_COMMUNITY): Payer: Self-pay

## 2020-10-12 ENCOUNTER — Telehealth (INDEPENDENT_AMBULATORY_CARE_PROVIDER_SITE_OTHER): Payer: Medicaid Other | Admitting: Psychiatry

## 2020-10-12 DIAGNOSIS — F3181 Bipolar II disorder: Secondary | ICD-10-CM | POA: Diagnosis not present

## 2020-10-12 MED ORDER — QUETIAPINE FUMARATE 25 MG PO TABS
25.0000 mg | ORAL_TABLET | Freq: Every day | ORAL | 2 refills | Status: DC
Start: 2020-10-12 — End: 2020-11-02

## 2020-10-12 MED ORDER — GABAPENTIN 100 MG PO CAPS
200.0000 mg | ORAL_CAPSULE | Freq: Three times a day (TID) | ORAL | 2 refills | Status: DC | PRN
Start: 2020-10-12 — End: 2020-11-02

## 2020-10-12 MED ORDER — OXCARBAZEPINE 300 MG PO TABS: 300.0000 mg | ORAL_TABLET | Freq: Two times a day (BID) | ORAL | 2 refills | Status: DC

## 2020-10-12 MED ORDER — HYDROXYZINE HCL 10 MG PO TABS: 10.0000 mg | ORAL_TABLET | Freq: Three times a day (TID) | ORAL | 2 refills | Status: DC | PRN

## 2020-10-12 NOTE — Progress Notes (Signed)
BH MD/PA/NP OP Progress Note  10/12/2020 2:22 PM [name]  MRN:  [id number]  Chief Complaint:  Follow up  Chief Complaint    Anxiety; Follow-up; Depression; Medication Refill     HPI: 45 year old female seen via video visit today with this provider. She states her recent move after living where she was for 12-13 years is difficult.   The change is increasing her stress and she does not feel safe in the environment, "It will take me awhile."  This is effecting her sleep significantly and her Trazodone is not working.  Her anxiety increases at night as she is worried that some of heartbreaking her home.  She is taking the hydroxyzine during the day which she has found helpful at times.  Depression is currently 5 out of 10 with 10 being the highest and her anxiety is "high" related to her new environment.  Denies suicidal/homicidal ideations, hallucinations, and substance use.  Her stress of school and being behind her class work is also stressing her related to her poor motivation at times related to lack of sleep.  Appetite is increased at times as she reports being an emotional or stress eater.  Discussed medications at length and decided to continue her Trileptal and add gabapentin 3 times daily as needed for anxiety and Seroquel 25 mg at bedtime for sleep.  She will start the gabapentin today and wait till the weekend to start her Seroquel per her request.  Follow-up in 1 month for stabilization of symptoms, sooner if needed.  Visit Diagnosis:  Bipolar d/o  Past Psychiatric History: Bipolar II  Past Medical History:  Past Medical History:  Diagnosis Date  . Depression   . Hypertension   . Migraine   . PTSD (post-traumatic stress disorder)   . Vertigo    History reviewed. No pertinent surgical history.  Family Psychiatric History: Not assessed   Family History:  Family History  Problem Relation Age of Onset  . Hypertension Mother   . Diabetes Mother   . Healthy Father      Social History:  Social History   Socioeconomic History  . Marital status: Single    Spouse name: Not on file  . Number of children: Not on file  . Years of education: Not on file  . Highest education level: Not on file  Occupational History  . Not on file  Tobacco Use  . Smoking status: Former Smoker    Packs/day: 0.50    Years: 19.00    Pack years: 9.50    Types: Cigarettes    Quit date: 09/23/2019    Years since quitting: 1.0  . Smokeless tobacco: Never Used  Vaping Use  . Vaping Use: Never used  Substance and Sexual Activity  . Alcohol use: No  . Drug use: No  . Sexual activity: Yes    Birth control/protection: None  Other Topics Concern  . Not on file  Social History Narrative  . Not on file   Social Determinants of Health   Financial Resource Strain: Not on file  Food Insecurity: Not on file  Transportation Needs: Not on file  Physical Activity: Not on file  Stress: Not on file  Social Connections: Not on file    Allergies:  Allergies  Allergen Reactions  . Aripiprazole     REACTION: Disoriented.  . Escitalopram Oxalate     REACTION: disoriented.  . Latex Itching  . Shellfish Allergy     Throat itching  Metabolic Disorder Labs: Lab Results  Component Value Date   HGBA1C 5.8 02/04/2011   MPG 126 02/28/2009   No results found for: PROLACTIN Lab Results  Component Value Date   CHOL 137 08/02/2019   TRIG 131 08/02/2019   HDL 37 (L) 08/02/2019   CHOLHDL 3.7 08/02/2019   VLDL 12 02/04/2011   LDLCALC 77 08/02/2019   LDLCALC 74 02/04/2011   Lab Results  Component Value Date   TSH 2.260 08/02/2019   TSH 0.829 09/22/2015    Therapeutic Level Labs: No results found for: LITHIUM No results found for: VALPROATE No components found for:  CBMZ  Current Medications: Current Outpatient Medications  Medication Sig Dispense Refill  . gabapentin (NEURONTIN) 100 MG capsule Take 2 capsules (200 mg total) by mouth 3 (three) times daily as  needed. 180 capsule 2  . QUEtiapine (SEROQUEL) 25 MG tablet Take 1 tablet (25 mg total) by mouth at bedtime. 30 tablet 2  . docusate sodium (COLACE) 100 MG capsule Take 1 capsule (100 mg total) by mouth 2 (two) times daily. (Patient taking differently: Take 100 mg by mouth 2 (two) times daily as needed for mild constipation. ) 60 capsule 3  . EPINEPHrine 0.3 mg/0.3 mL IJ SOAJ injection Inject 0.3 mLs (0.3 mg total) into the muscle as needed for anaphylaxis. 1 each 0  . ferrous sulfate (FERROUSUL) 325 (65 FE) MG tablet Take 1 tablet (325 mg total) by mouth every other day. 45 tablet 3  . fluticasone (FLONASE) 50 MCG/ACT nasal spray Place 2 sprays into both nostrils daily. (Patient taking differently: Place 2 sprays into both nostrils daily as needed for allergies. ) 16 g 6  . hydrochlorothiazide (MICROZIDE) 12.5 MG capsule TAKE 1 CAPSULE BY MOUTH EVERY DAY 60 capsule 0  . hydrocortisone 2.5 % ointment Use two times daily and apply with q-tip to external ear canal 20 g 0  . hydrOXYzine (ATARAX/VISTARIL) 10 MG tablet Take 1 tablet (10 mg total) by mouth 3 (three) times daily as needed for anxiety. 30 tablet 2  . loratadine (CLARITIN) 10 MG tablet TAKE 1 TABLET BY MOUTH EVERY DAY 90 tablet 0  . meclizine (ANTIVERT) 12.5 MG tablet Take 1 tablet (12.5 mg total) by mouth 3 (three) times daily as needed for dizziness. 30 tablet 0  . megestrol (MEGACE) 40 MG tablet Take 1 tablet (40 mg total) by mouth 2 (two) times daily. 30 tablet 0  . methocarbamol (ROBAXIN) 500 MG tablet Take 1 tablet (500 mg total) by mouth every 8 (eight) hours as needed for muscle spasms. 15 tablet 0  . montelukast (SINGULAIR) 10 MG tablet Take 1 tablet (10 mg total) by mouth at bedtime. 30 tablet 3  . omeprazole (PRILOSEC) 20 MG capsule TAKE 1 CAPSULE BY MOUTH EVERY DAY 90 capsule 0  . Oxcarbazepine (TRILEPTAL) 300 MG tablet Take 1 tablet (300 mg total) by mouth 2 (two) times daily. 60 tablet 2  . polyethylene glycol (MIRALAX /  GLYCOLAX) 17 g packet Take 17 g by mouth 3 (three) times daily. Until stooling regularly (Patient taking differently: Take 17 g by mouth daily. Until stooling regularly) 100 packet 2  . sucralfate (CARAFATE) 1 GM/10ML suspension Take 10 mLs (1 g total) by mouth 4 (four) times daily -  with meals and at bedtime. 420 mL 0  . tiZANidine (ZANAFLEX) 4 MG capsule Take 1 capsule (4 mg total) by mouth 2 (two) times daily as needed (for migraine, NOT BEFORE DRIVING). Do not take with flexeril  15 capsule 0   No current facility-administered medications for this visit.     Musculoskeletal: Strength & Muscle Tone: WDL Gait & Station: WDL Patient leans: N/A  Psychiatric Specialty Exam: Review of Systems  Psychiatric/Behavioral: Positive for dysphoric mood and sleep disturbance. Negative for hallucinations, self-injury and suicidal ideas. The patient is nervous/anxious.   All other systems reviewed and are negative.   There were no vitals taken for this visit.There is no height or weight on file to calculate BMI.  General Appearance: Casual  Eye Contact: Good  Speech:  Clear and Coherent  Volume:  Normal  Mood: High level of anxiety and moderate depression  Affect: Congruent  Thought Process:  Coherent and Linear  Orientation:  Full (Time, Place, and Person)  Thought Content: WDL and Logical   Suicidal Thoughts:  No  Homicidal Thoughts:  No  Memory:  Immediate;   Good Recent;   Good Remote;   Good  Judgement:  Intact  Insight:  Good  Psychomotor Activity:  Normal  Concentration:  Concentration: Good and Attention Span: Good  Recall:  Good  Fund of Knowledge: Good  Language: Good  Akathisia:  No  Handed:  Right  AIMS (if indicated): not done  Assets:  Communication Skills Desire for Improvement Physical Health  ADL's:  Intact  Cognition: WNL  Sleep:  Good   Screenings: PHQ2-9   Flowsheet Row Office Visit from 07/03/2020 in [facility] Office Visit from  01/03/2020 in [facility] Office Visit from 02/12/2019 in [facility] Office Visit from 05/31/2018 in [facility] Office Visit from 01/17/2018 in [facility]  PHQ-2 Total Score 2 1 0 0 0  PHQ-9 Total Score 6 - - - -       Assessment and Plan:   Biploar II - Continue Trileptal 300 mg bid - Follow up in 1 month   Anxiety: -Continue hydroxyzine 10 mg TID PRN, not at same time as gabapentin, she would like to continue this for allergies at times -Started gabapentin 1 to 200 mg 3 times daily as needed  Insomnia - Disontinue trazodone 50 mg qhs PRN  -Started Seroquel 25 mg at bedtime  Virtual Visit via Telephone Note  I connected with [name] on 08/11/2020 at  2:00 PM EST by a video enabled telephone application and verified that I am speaking with the correct person using two identifiers.  Location: Patient: Home Provider: home   I discussed the limitations of evaluation and management by telemedicine and the availability of in person appointments. The patient expressed understanding and agreed to proceed.  Follow Up Instructions: -Follow-up in 1 month sooner if needed  I discussed the assessment and treatment plan with the patient. The patient was provided an opportunity to ask questions and all were answered. The patient agreed with the plan and demonstrated an understanding of the instructions.   The patient was advised to call back or seek an in-person evaluation if the symptoms worsen or if the condition fails to improve as anticipated.  I provided 20 minutes of non-face-to-face time during this encounter.   Nanine Means, NP   Nanine Means, NP 10/12/2020, 2:22 PM

## 2020-10-13 MED ORDER — HYDROCHLOROTHIAZIDE 12.5 MG PO CAPS: ORAL_CAPSULE | ORAL | 0 refills | Status: DC

## 2020-10-13 NOTE — Telephone Encounter (Signed)
Patient calls nurse line regarding blood pressure medication. Patient has scheduled appointment with PCP on 3/7. Patient is completely out of medication. Please advise if patient can receive partial refill until appointment on 3/7.  Veronda Prude, RN

## 2020-10-13 NOTE — Addendum Note (Signed)
Addended by: Leeroy Bock on: 10/13/2020 11:03 AM   Modules accepted: Orders

## 2020-10-22 DIAGNOSIS — F438 Other reactions to severe stress: Secondary | ICD-10-CM | POA: Diagnosis not present

## 2020-10-22 DIAGNOSIS — F3132 Bipolar disorder, current episode depressed, moderate: Secondary | ICD-10-CM | POA: Diagnosis not present

## 2020-10-26 ENCOUNTER — Ambulatory Visit: Payer: Medicaid Other | Admitting: Student in an Organized Health Care Education/Training Program

## 2020-11-02 ENCOUNTER — Telehealth (INDEPENDENT_AMBULATORY_CARE_PROVIDER_SITE_OTHER): Payer: Medicaid Other | Admitting: Psychiatry

## 2020-11-02 ENCOUNTER — Other Ambulatory Visit: Payer: Self-pay

## 2020-11-02 ENCOUNTER — Encounter (HOSPITAL_COMMUNITY): Payer: Self-pay

## 2020-11-02 DIAGNOSIS — F3131 Bipolar disorder, current episode depressed, mild: Secondary | ICD-10-CM | POA: Diagnosis not present

## 2020-11-02 MED ORDER — QUETIAPINE FUMARATE 25 MG PO TABS: 25.0000 mg | ORAL_TABLET | Freq: Every day | ORAL | 2 refills | Status: DC

## 2020-11-02 MED ORDER — GABAPENTIN 100 MG PO CAPS: 100.0000 mg | ORAL_CAPSULE | Freq: Three times a day (TID) | ORAL | 2 refills | Status: DC | PRN

## 2020-11-02 MED ORDER — GABAPENTIN 100 MG PO CAPS: 200.0000 mg | ORAL_CAPSULE | Freq: Three times a day (TID) | ORAL | 2 refills | Status: DC | PRN

## 2020-11-02 MED ORDER — OXCARBAZEPINE 300 MG PO TABS: 300.0000 mg | ORAL_TABLET | Freq: Two times a day (BID) | ORAL | 2 refills | Status: DC

## 2020-11-02 NOTE — Progress Notes (Addendum)
BH MD/PA/NP OP Progress Note  11/02/2020 11:06 AM [name]  MRN:  [id number]  Chief Complaint:  Follow up  Chief Complaint     Anxiety; Depression; Follow-up      HPI: 45 year old female seen via video visit today with this provider. She states her recent move after living where she was for 12-13 years which was difficult for her, continues to be anxious about someone breaking in at times. She did install cameras which are helping her. Sleep is improving as she is getting [place] to her surroundings, denies any issues, Seroquel is assisting with her sleep.  Appetite is steady.  3/10 depression over the past week and 5/10 anxiety, no suicidal ideations.  Feels her medications are working and denies side effects.  Affect and mood improved since the last visit with less stress.  Follow-up in 2  months for stabilization of symptoms, sooner if needed.  Visit Diagnosis:  Bipolar d/o  Past Psychiatric History: Bipolar II  Past Medical History:  Past Medical History:  Diagnosis Date   Depression    Hypertension    Migraine    PTSD (post-traumatic stress disorder)    Vertigo    History reviewed. No pertinent surgical history.  Family Psychiatric History: Not assessed   Family History:  Family History  Problem Relation Age of Onset   Hypertension Mother    Diabetes Mother    Healthy Father     Social History:  Social History   Socioeconomic History   Marital status: Single    Spouse name: Not on file   Number of children: Not on file   Years of education: Not on file   Highest education level: Not on file  Occupational History   Not on file  Tobacco Use   Smoking status: Former Smoker    Packs/day: 0.50    Years: 19.00    Pack years: 9.50    Types: Cigarettes    Quit date: 09/23/2019    Years since quitting: 1.1   Smokeless tobacco: Never Used  Vaping Use   Vaping Use: Never used  Substance and Sexual Activity   Alcohol use: No   Drug use: No   Sexual activity:  Yes    Birth control/protection: None  Other Topics Concern   Not on file  Social History Narrative   Not on file   Social Determinants of Health   Financial Resource Strain: Not on file  Food Insecurity: Not on file  Transportation Needs: Not on file  Physical Activity: Not on file  Stress: Not on file  Social Connections: Not on file    Allergies:  Allergies  Allergen Reactions   Aripiprazole     REACTION: Disoriented.   Escitalopram Oxalate     REACTION: disoriented.   Latex Itching   Shellfish Allergy     Throat itching    Metabolic Disorder Labs: Lab Results  Component Value Date   HGBA1C 5.8 02/04/2011   MPG 126 02/28/2009   No results found for: PROLACTIN Lab Results  Component Value Date   CHOL 137 08/02/2019   TRIG 131 08/02/2019   HDL 37 (L) 08/02/2019   CHOLHDL 3.7 08/02/2019   VLDL 12 02/04/2011   LDLCALC 77 08/02/2019   LDLCALC 74 02/04/2011   Lab Results  Component Value Date   TSH 2.260 08/02/2019   TSH 0.829 09/22/2015    Therapeutic Level Labs: No results found for: LITHIUM No results found for: VALPROATE No components found for:  CBMZ  Current Medications: Current Outpatient Medications  Medication Sig Dispense Refill   EPINEPHrine 0.3 mg/0.3 mL IJ SOAJ injection Inject 0.3 mLs (0.3 mg total) into the muscle as needed for anaphylaxis. 1 each 0   ferrous sulfate (FERROUSUL) 325 (65 FE) MG tablet Take 1 tablet (325 mg total) by mouth every other day. 45 tablet 3   fluticasone (FLONASE) 50 MCG/ACT nasal spray Place 2 sprays into both nostrils daily. (Patient taking differently: Place 2 sprays into both nostrils daily as needed for allergies. ) 16 g 6   gabapentin (NEURONTIN) 100 MG capsule Take 2 capsules (200 mg total) by mouth 3 (three) times daily as needed. 180 capsule 2   hydrochlorothiazide (MICROZIDE) 12.5 MG capsule TAKE 1 CAPSULE BY MOUTH EVERY DAY 18 capsule 0   hydrocortisone 2.5 % ointment Use two times daily and apply with  q-tip to external ear canal 20 g 0   hydrOXYzine (ATARAX/VISTARIL) 10 MG tablet Take 1 tablet (10 mg total) by mouth 3 (three) times daily as needed for anxiety. 30 tablet 2   loratadine (CLARITIN) 10 MG tablet TAKE 1 TABLET BY MOUTH EVERY DAY 90 tablet 0   meclizine (ANTIVERT) 12.5 MG tablet Take 1 tablet (12.5 mg total) by mouth 3 (three) times daily as needed for dizziness. 30 tablet 0   megestrol (MEGACE) 40 MG tablet Take 1 tablet (40 mg total) by mouth 2 (two) times daily. 30 tablet 0   methocarbamol (ROBAXIN) 500 MG tablet Take 1 tablet (500 mg total) by mouth every 8 (eight) hours as needed for muscle spasms. 15 tablet 0   montelukast (SINGULAIR) 10 MG tablet Take 1 tablet (10 mg total) by mouth at bedtime. 30 tablet 3   omeprazole (PRILOSEC) 20 MG capsule TAKE 1 CAPSULE BY MOUTH EVERY DAY 90 capsule 0   Oxcarbazepine (TRILEPTAL) 300 MG tablet Take 1 tablet (300 mg total) by mouth 2 (two) times daily. 60 tablet 2   polyethylene glycol (MIRALAX / GLYCOLAX) 17 g packet Take 17 g by mouth 3 (three) times daily. Until stooling regularly (Patient taking differently: Take 17 g by mouth daily. Until stooling regularly) 100 packet 2   QUEtiapine (SEROQUEL) 25 MG tablet Take 1 tablet (25 mg total) by mouth at bedtime. 30 tablet 2   sucralfate (CARAFATE) 1 GM/10ML suspension Take 10 mLs (1 g total) by mouth 4 (four) times daily -  with meals and at bedtime. 420 mL 0   tiZANidine (ZANAFLEX) 4 MG capsule Take 1 capsule (4 mg total) by mouth 2 (two) times daily as needed (for migraine, NOT BEFORE DRIVING). Do not take with flexeril 15 capsule 0   No current facility-administered medications for this visit.     Musculoskeletal: Strength & Muscle Tone: WDL Gait & Station: WDL Patient leans: N/A  Psychiatric Specialty Exam: Review of Systems  Psychiatric/Behavioral: Positive for dysphoric mood. Negative for hallucinations, self-injury and suicidal ideas. The patient is nervous/anxious.   All other  systems reviewed and are negative.   There were no vitals taken for this visit.There is no height or weight on file to calculate BMI.  General Appearance: Casual  Eye Contact: Good  Speech:  Clear and Coherent  Volume:  Normal  Mood: low depression and moderate anxiety  Affect: Congruent  Thought Process:  Coherent and Linear  Orientation:  Full (Time, Place, and Person)  Thought Content: WDL and Logical   Suicidal Thoughts:  No  Homicidal Thoughts:  No  Memory:  Immediate;  Good Recent;   Good Remote;   Good  Judgement:  Intact  Insight:  Good  Psychomotor Activity:  Normal  Concentration:  Concentration: Good and Attention Span: Good  Recall:  Good  Fund of Knowledge: Good  Language: Good  Akathisia:  No  Handed:  Right  AIMS (if indicated): not done  Assets:  Communication Skills Desire for Improvement Physical Health  ADL's:  Intact  Cognition: WNL  Sleep:  Good   Screenings: PHQ2-9    Flowsheet Row Video Visit from 11/02/2020 in [facility] Office Visit from 07/03/2020 in [facility] Office Visit from 01/03/2020 in [facility] Office Visit from 02/12/2019 in [facility] Office Visit from 05/31/2018 in [facility]  PHQ-2 Total Score 0 2 1 0 0  PHQ-9 Total Score -- 6 -- -- --      Flowsheet Row Video Visit from 11/02/2020 in [facility]  C-SSRS RISK CATEGORY No Risk        Assessment and Plan:   Biploar II - Continue Trileptal 300 mg bid - Follow up in 2 months   Anxiety: -Continue hydroxyzine 10 mg TID PRN, not at same time as gabapentin, she would like to continue this for allergies at times (Claritin and Benadryl for allergies along with Singulair) -continue gabapentin 100 mg 3 times daily as needed last time, has not started it yet  Insomnia -Continue Seroquel 25 mg at bedtime  Virtual Visit via Telephone  Note  I connected with Linward Headland on 08/11/2020 at 11:00 AM EDT by a video enabled telephone application and verified that I am speaking with the correct person using two identifiers.  Location: Patient: Home Provider: home   I discussed the limitations of evaluation and management by telemedicine and the availability of in person appointments. The patient expressed understanding and agreed to proceed.  Follow Up Instructions: -Follow-up in 2 months sooner if needed  I discussed the assessment and treatment plan with the patient. The patient was provided an opportunity to ask questions and all were answered. The patient agreed with the plan and demonstrated an understanding of the instructions.   The patient was advised to call back or seek an in-person evaluation if the symptoms worsen or if the condition fails to improve as anticipated.  I provided 20 minutes of non-face-to-face time during this encounter.   Nanine Means, NP   Nanine Means, NP 11/02/2020, 11:06 AM  I reviewed chart and agreed with the findings and treatment Plan of the patient.  Kathryne Sharper, MD

## 2020-11-03 ENCOUNTER — Ambulatory Visit (INDEPENDENT_AMBULATORY_CARE_PROVIDER_SITE_OTHER): Payer: Medicaid Other | Admitting: Student in an Organized Health Care Education/Training Program

## 2020-11-03 ENCOUNTER — Other Ambulatory Visit: Payer: Self-pay

## 2020-11-03 ENCOUNTER — Encounter: Payer: Self-pay | Admitting: Student in an Organized Health Care Education/Training Program

## 2020-11-03 VITALS — BP 142/85 | HR 85 | Ht 69.0 in | Wt 262.4 lb

## 2020-11-03 DIAGNOSIS — M25511 Pain in right shoulder: Secondary | ICD-10-CM | POA: Diagnosis not present

## 2020-11-03 DIAGNOSIS — I10 Essential (primary) hypertension: Secondary | ICD-10-CM | POA: Diagnosis not present

## 2020-11-03 DIAGNOSIS — G8929 Other chronic pain: Secondary | ICD-10-CM

## 2020-11-03 DIAGNOSIS — Z1231 Encounter for screening mammogram for malignant neoplasm of breast: Secondary | ICD-10-CM | POA: Diagnosis not present

## 2020-11-03 DIAGNOSIS — Z114 Encounter for screening for human immunodeficiency virus [HIV]: Secondary | ICD-10-CM

## 2020-11-03 DIAGNOSIS — G6289 Other specified polyneuropathies: Secondary | ICD-10-CM | POA: Diagnosis not present

## 2020-11-03 DIAGNOSIS — M25512 Pain in left shoulder: Secondary | ICD-10-CM | POA: Diagnosis not present

## 2020-11-03 DIAGNOSIS — Z1159 Encounter for screening for other viral diseases: Secondary | ICD-10-CM

## 2020-11-03 LAB — POCT GLYCOSYLATED HEMOGLOBIN (HGB A1C): Hemoglobin A1C: 5.9 % — AB (ref 4.0–5.6)

## 2020-11-03 NOTE — Patient Instructions (Signed)
It was a pleasure to see you today!  To summarize our discussion for this visit:  For your blood pressure:  We are going to check for diabetes, cholesterol, kidney function today and based on those labs, we will refill your medication or try a different one  For your pain:  We are checking some blood work and getting an xray to rule out some common causes.  We will discuss follow up based on these results  Please get your mammogram in the near future  Come back in near future for your pap smear as well. Thanks!  Some additional health maintenance measures we should update are: Health Maintenance Due  Topic Date Due  . Hepatitis C Screening  Never done  . HIV Screening  Never done  . INFLUENZA VACCINE  03/22/2020  . PAP SMEAR-Modifier  05/29/2020  .    Please return to our clinic to see me asap for pap.  Call the clinic at [phone] if your symptoms worsen or you have any concerns.   Thank you for allowing me to take part in your care,  Dr. Jamelle Rushing

## 2020-11-03 NOTE — Progress Notes (Signed)
   SUBJECTIVE:   CHIEF COMPLAINT / HPI:  HTN f/u  HTN- ran out yesterday of HCTZ. Doesn't have a cuff at home. Asymptomatic. She is anxious about her blood pressure.   Pain-  Present about a year. Present every day. Worse in the evening.  No known injury. location: Same on both sides of neck and shoulder.  Simple activities like brushing hair makes arms tired. When lays on the side, will have some numbness of her arm/hand.  Hands do get cold feeling. Others can tell that they are cold.  Never had weakness of grip.  No rashes Positive for night sweats and sweatiness  No weight loss, in fact, weight gain.  Interferes with sleep. Tiger balm helps temporarily. Ibuprofen as well. Not up to date on mammograms.  OBJECTIVE:   BP (!) 142/85   Pulse 85   Ht 5' 9" (1.753 m)   Wt 262 lb 6.4 oz (119 kg)   LMP  (LMP Unknown)   SpO2 96%   BMI 38.75 kg/m   Recheck by provider: 150/90 Physical Exam Vitals and nursing note reviewed.  Constitutional:      Appearance: Normal appearance.  Cardiovascular:     Rate and Rhythm: Normal rate and regular rhythm.     Pulses: Normal pulses.     Heart sounds: No murmur heard.   Pulmonary:     Effort: Pulmonary effort is normal.  Skin:    General: Skin is warm.  Neurological:     General: No focal deficit present.     Mental Status: She is alert.     Sensory: Sensation is intact.     Motor: Motor function is intact. No weakness.     Comments: Neg spalding  Psychiatric:        Mood and Affect: Mood normal.    ASSESSMENT/PLAN:   Essential hypertension, benign Elevated today, asymptomatic. Provided education.  BMP- normal kidney function Pre-diabetic Recommended arb and patient agreed olmesartan sent to pharmacy Recommended home monitoring and bring in log Recheck in 2 weeks with BP check and repeat BMP  Shoulder pain Bilateral and chronic.  Negative spalding. Crepitus with neck ROM.  H/o cervical radiculopathy.  Topical not  helping.  Concern for nerve impingement.  Obtaining cervical xray R/o dermatomysitis. Obtaining ESR, CRP today. Elevated ESR- called patient to discuss.  She will get repeat labs to further r/o at our f/u in 2 weeks to include- CK, aldolase, CBC with diff, TSH   Ordered mammogram   L , DO [facility]     

## 2020-11-04 DIAGNOSIS — M25519 Pain in unspecified shoulder: Secondary | ICD-10-CM | POA: Insufficient documentation

## 2020-11-04 LAB — LIPID PANEL
Chol/HDL Ratio: 3.5 ratio (ref 0.0–4.4)
Cholesterol, Total: 145 mg/dL (ref 100–199)
HDL: 41 mg/dL (ref >39)
LDL Chol Calc (NIH): 89 mg/dL (ref 0–99)
Triglycerides: 76 mg/dL (ref 0–149)
VLDL Cholesterol Cal: 15 mg/dL (ref 5–40)

## 2020-11-04 LAB — BASIC METABOLIC PANEL
BUN/Creatinine Ratio: 19 (ref 9–23)
BUN: 14 mg/dL (ref 6–24)
CO2: 20 mmol/L (ref 20–29)
Calcium: 9.6 mg/dL (ref 8.7–10.2)
Chloride: 102 mmol/L (ref 96–106)
Creatinine, Ser: 0.74 mg/dL (ref 0.57–1.00)
Glucose: 100 mg/dL — ABNORMAL HIGH (ref 65–99)
Potassium: 4.1 mmol/L (ref 3.5–5.2)
Sodium: 138 mmol/L (ref 134–144)
eGFR: 103 mL/min/{1.73_m2} (ref >59)

## 2020-11-04 LAB — SEDIMENTATION RATE: Sed Rate: 85 mm/hr — ABNORMAL HIGH (ref 0–32)

## 2020-11-04 LAB — C-REACTIVE PROTEIN: CRP: 10 mg/L (ref 0–10)

## 2020-11-04 LAB — HEPATITIS C ANTIBODY: Hep C Virus Ab: <0.1 s/co ratio (ref 0.0–0.9)

## 2020-11-04 LAB — HIV ANTIBODY (ROUTINE TESTING W REFLEX): HIV Screen 4th Generation wRfx: NONREACTIVE

## 2020-11-04 MED ORDER — OLMESARTAN MEDOXOMIL 5 MG PO TABS: 5.0000 mg | ORAL_TABLET | Freq: Every day | ORAL | 0 refills | Status: DC

## 2020-11-04 NOTE — Assessment & Plan Note (Signed)
Bilateral and chronic.  Negative spalding. Crepitus with neck ROM.  H/o cervical radiculopathy.  Topical not helping.  Concern for nerve impingement.  Obtaining cervical xray R/o dermatomysitis. Obtaining ESR, CRP today. Elevated ESR- called patient to discuss.  She will get repeat labs to further r/o at our f/u in 2 weeks to include- CK, aldolase, CBC with diff, TSH

## 2020-11-04 NOTE — Assessment & Plan Note (Signed)
Elevated today, asymptomatic. Provided education.  BMP- normal kidney function Pre-diabetic Recommended arb and patient agreed olmesartan sent to pharmacy Recommended home monitoring and bring in log Recheck in 2 weeks with BP check and repeat BMP

## 2020-11-05 DIAGNOSIS — F3132 Bipolar disorder, current episode depressed, moderate: Secondary | ICD-10-CM | POA: Diagnosis not present

## 2020-11-05 DIAGNOSIS — F438 Other reactions to severe stress: Secondary | ICD-10-CM | POA: Diagnosis not present

## 2020-11-11 ENCOUNTER — Other Ambulatory Visit: Payer: Self-pay | Admitting: Student in an Organized Health Care Education/Training Program

## 2020-11-11 DIAGNOSIS — Z1231 Encounter for screening mammogram for malignant neoplasm of breast: Secondary | ICD-10-CM

## 2020-11-12 DIAGNOSIS — F3132 Bipolar disorder, current episode depressed, moderate: Secondary | ICD-10-CM | POA: Diagnosis not present

## 2020-11-12 DIAGNOSIS — F438 Other reactions to severe stress: Secondary | ICD-10-CM | POA: Diagnosis not present

## 2020-11-16 ENCOUNTER — Other Ambulatory Visit: Payer: Self-pay | Admitting: Family Medicine

## 2020-11-16 ENCOUNTER — Other Ambulatory Visit: Payer: Self-pay | Admitting: Student in an Organized Health Care Education/Training Program

## 2020-11-16 DIAGNOSIS — Z1231 Encounter for screening mammogram for malignant neoplasm of breast: Secondary | ICD-10-CM

## 2020-11-26 DIAGNOSIS — F438 Other reactions to severe stress: Secondary | ICD-10-CM | POA: Diagnosis not present

## 2020-11-26 DIAGNOSIS — F3132 Bipolar disorder, current episode depressed, moderate: Secondary | ICD-10-CM | POA: Diagnosis not present

## 2020-12-11 ENCOUNTER — Encounter: Payer: Self-pay | Admitting: Student in an Organized Health Care Education/Training Program

## 2020-12-17 DIAGNOSIS — F438 Other reactions to severe stress: Secondary | ICD-10-CM | POA: Diagnosis not present

## 2020-12-17 DIAGNOSIS — F3132 Bipolar disorder, current episode depressed, moderate: Secondary | ICD-10-CM | POA: Diagnosis not present

## 2020-12-24 DIAGNOSIS — F438 Other reactions to severe stress: Secondary | ICD-10-CM | POA: Diagnosis not present

## 2020-12-24 DIAGNOSIS — F3132 Bipolar disorder, current episode depressed, moderate: Secondary | ICD-10-CM | POA: Diagnosis not present

## 2020-12-28 ENCOUNTER — Encounter (HOSPITAL_COMMUNITY): Payer: Self-pay

## 2020-12-28 ENCOUNTER — Encounter (INDEPENDENT_AMBULATORY_CARE_PROVIDER_SITE_OTHER): Payer: Medicaid Other | Admitting: Psychiatry

## 2020-12-28 DIAGNOSIS — F3132 Bipolar disorder, current episode depressed, moderate: Secondary | ICD-10-CM

## 2020-12-28 DIAGNOSIS — F3181 Bipolar II disorder: Secondary | ICD-10-CM

## 2020-12-28 MED ORDER — OXCARBAZEPINE 300 MG PO TABS: 300.0000 mg | ORAL_TABLET | Freq: Two times a day (BID) | ORAL | 2 refills | Status: DC

## 2020-12-28 MED ORDER — GABAPENTIN 100 MG PO CAPS: 100.0000 mg | ORAL_CAPSULE | Freq: Three times a day (TID) | ORAL | 2 refills | Status: DC | PRN

## 2020-12-28 MED ORDER — HYDROXYZINE HCL 10 MG PO TABS: 10.0000 mg | ORAL_TABLET | Freq: Three times a day (TID) | ORAL | 2 refills | Status: DC | PRN

## 2020-12-28 MED ORDER — SERTRALINE HCL 50 MG PO TABS: 25.0000 mg | ORAL_TABLET | Freq: Every day | ORAL | 2 refills | Status: DC

## 2020-12-28 MED ORDER — QUETIAPINE FUMARATE 25 MG PO TABS: 25.0000 mg | ORAL_TABLET | Freq: Every day | ORAL | 2 refills | Status: DC

## 2020-12-28 NOTE — Progress Notes (Signed)
BH MD/PA/NP OP Progress Note  12/28/2020 10:59 AM [name]  MRN:  [id number]  Chief Complaint:  Follow up  Chief Complaint    Anxiety; Follow-up; Depression     HPI: 45 year old female seen via video visit today for f/u with this provider.  Sleep improved, stable on seroquel. Feels depressed some days, "eats when depressed" as coping behavior, some days with binges. Quit her job as it was contributing to her depression, was unhealthy work environment.  Currently unemployed, searching for jobs. She is going back to school struggling with grades, lack of motivation. Currently in Therapy, helfpul. Feels safe in her environment. Denies Suicidal ideation. Denies panic episode. Denies mania/hypomania symptoms. Has partner with good emotional support.  Denies side effects.  Discussed medications and decided to start  Visit Diagnosis:  Bipolar d/o  Past Psychiatric History: Bipolar II  Past Medical History:  Past Medical History:  Diagnosis Date  . Depression   . Hypertension   . Migraine   . PTSD (post-traumatic stress disorder)   . Vertigo    No past surgical history on file.  Family Psychiatric History: Not assessed   Family History:  Family History  Problem Relation Age of Onset  . Hypertension Mother   . Diabetes Mother   . Healthy Father     Social History:  Social History   Socioeconomic History  . Marital status: Single    Spouse name: Not on file  . Number of children: Not on file  . Years of education: Not on file  . Highest education level: Not on file  Occupational History  . Not on file  Tobacco Use  . Smoking status: Former Smoker    Packs/day: 0.50    Years: 19.00    Pack years: 9.50    Types: Cigarettes    Quit date: 09/23/2019    Years since quitting: 1.2  . Smokeless tobacco: Never Used  Vaping Use  . Vaping Use: Never used  Substance and Sexual Activity  . Alcohol use: No  . Drug use: No  . Sexual activity: Yes    Birth control/protection:  None  Other Topics Concern  . Not on file  Social History Narrative  . Not on file   Social Determinants of Health   Financial Resource Strain: Not on file  Food Insecurity: Not on file  Transportation Needs: Not on file  Physical Activity: Not on file  Stress: Not on file  Social Connections: Not on file    Allergies:  Allergies  Allergen Reactions  . Aripiprazole     REACTION: Disoriented.  . Escitalopram Oxalate     REACTION: disoriented.  . Latex Itching  . Shellfish Allergy     Throat itching    Metabolic Disorder Labs: Lab Results  Component Value Date   HGBA1C 5.9 (A) 11/03/2020   MPG 126 02/28/2009   No results found for: PROLACTIN Lab Results  Component Value Date   CHOL 145 11/03/2020   TRIG 76 11/03/2020   HDL 41 11/03/2020   CHOLHDL 3.5 11/03/2020   VLDL 12 02/04/2011   LDLCALC 89 11/03/2020   LDLCALC 77 08/02/2019   Lab Results  Component Value Date   TSH 2.260 08/02/2019   TSH 0.829 09/22/2015    Therapeutic Level Labs: No results found for: LITHIUM No results found for: VALPROATE No components found for:  CBMZ  Current Medications: Current Outpatient Medications  Medication Sig Dispense Refill  . EPINEPHrine 0.3 mg/0.3 mL IJ SOAJ  injection Inject 0.3 mLs (0.3 mg total) into the muscle as needed for anaphylaxis. 1 each 0  . ferrous sulfate (FERROUSUL) 325 (65 FE) MG tablet Take 1 tablet (325 mg total) by mouth every other day. 45 tablet 3  . fluticasone (FLONASE) 50 MCG/ACT nasal spray Place 2 sprays into both nostrils daily. (Patient taking differently: Place 2 sprays into both nostrils daily as needed for allergies. ) 16 g 6  . gabapentin (NEURONTIN) 100 MG capsule Take 1 capsule (100 mg total) by mouth 3 (three) times daily as needed. 180 capsule 2  . hydrochlorothiazide (MICROZIDE) 12.5 MG capsule TAKE 1 CAPSULE BY MOUTH EVERY DAY 18 capsule 0  . hydrocortisone 2.5 % ointment Use two times daily and apply with q-tip to external ear  canal 20 g 0  . hydrOXYzine (ATARAX/VISTARIL) 10 MG tablet Take 1 tablet (10 mg total) by mouth 3 (three) times daily as needed for anxiety. 30 tablet 2  . loratadine (CLARITIN) 10 MG tablet TAKE 1 TABLET BY MOUTH EVERY DAY 90 tablet 0  . meclizine (ANTIVERT) 12.5 MG tablet Take 1 tablet (12.5 mg total) by mouth 3 (three) times daily as needed for dizziness. 30 tablet 0  . megestrol (MEGACE) 40 MG tablet Take 1 tablet (40 mg total) by mouth 2 (two) times daily. 30 tablet 0  . methocarbamol (ROBAXIN) 500 MG tablet Take 1 tablet (500 mg total) by mouth every 8 (eight) hours as needed for muscle spasms. 15 tablet 0  . montelukast (SINGULAIR) 10 MG tablet Take 1 tablet (10 mg total) by mouth at bedtime. 30 tablet 3  . olmesartan (BENICAR) 5 MG tablet Take 1 tablet (5 mg total) by mouth at bedtime. 90 tablet 0  . omeprazole (PRILOSEC) 20 MG capsule TAKE 1 CAPSULE BY MOUTH EVERY DAY 90 capsule 0  . Oxcarbazepine (TRILEPTAL) 300 MG tablet Take 1 tablet (300 mg total) by mouth 2 (two) times daily. 60 tablet 2  . polyethylene glycol (MIRALAX / GLYCOLAX) 17 g packet Take 17 g by mouth 3 (three) times daily. Until stooling regularly (Patient taking differently: Take 17 g by mouth daily. Until stooling regularly) 100 packet 2  . QUEtiapine (SEROQUEL) 25 MG tablet Take 1 tablet (25 mg total) by mouth at bedtime. 30 tablet 2  . sucralfate (CARAFATE) 1 GM/10ML suspension Take 10 mLs (1 g total) by mouth 4 (four) times daily -  with meals and at bedtime. 420 mL 0  . tiZANidine (ZANAFLEX) 4 MG capsule Take 1 capsule (4 mg total) by mouth 2 (two) times daily as needed (for migraine, NOT BEFORE DRIVING). Do not take with flexeril 15 capsule 0   No current facility-administered medications for this visit.     Musculoskeletal: Strength & Muscle Tone: WDL Gait & Station: WDL Patient leans: N/A  Psychiatric Specialty Exam: Review of Systems  Psychiatric/Behavioral: Positive for dysphoric mood. Negative for  hallucinations, self-injury and suicidal ideas. The patient is nervous/anxious.   All other systems reviewed and are negative.   There were no vitals taken for this visit.There is no height or weight on file to calculate BMI.  General Appearance: Casual  Eye Contact: Good  Speech:  Clear and Coherent  Volume:  Normal  Mood: depression and anxiety  Affect: Congruent  Thought Process:  Coherent and Linear  Orientation:  Full (Time, Place, and Person)  Thought Content: WDL and Logical   Suicidal Thoughts:  No  Homicidal Thoughts:  No  Memory:  Immediate;   Good Recent;  Good Remote;   Good  Judgement:  Intact  Insight:  Good  Psychomotor Activity:  Normal  Concentration:  Concentration: Good and Attention Span: Good  Recall:  Good  Fund of Knowledge: Good  Language: Good  Akathisia:  No  Handed:  Right  AIMS (if indicated): not done  Assets:  Communication Skills Desire for Improvement Physical Health  ADL's:  Intact  Cognition: WNL  Sleep:  Good   Screenings: PHQ2-9   Flowsheet Row Office Visit from 11/03/2020 in [facility] Video Visit from 11/02/2020 in [facility] Office Visit from 07/03/2020 in [facility] Office Visit from 01/03/2020 in [facility] Office Visit from 02/12/2019 in [facility]  PHQ-2 Total Score 2 0 2 1 0  PHQ-9 Total Score 10 -- 6 -- --    Flowsheet Row Video Visit from 11/02/2020 in [facility]  C-SSRS RISK CATEGORY No Risk       Assessment and Plan:   Biploar II - Continue Trileptal 300 mg bid - Follow up in 2 weeks  Depression: -Started Zoloft 25 mg daily  Anxiety: -Continue hydroxyzine 10 mg TID PRN, not at same time as gabapentin, she would like to continue this for allergies at times (Claritin and Benadryl for allergies along with Singulair) -continue gabapentin 100 mg 3 times daily as needed  last time, has not started it yet  Insomnia -Continue Seroquel 25 mg at bedtime  Virtual Visit via Video Note  I connected with [name] on 12/28/20 at 11:00 AM EDT by a video enabled telemedicine application and verified that I am speaking with the correct person using two identifiers.  Location: Patient: home Provider: home office   I discussed the limitations of evaluation and management by telemedicine and the availability of in person appointments. The patient expressed understanding and agreed to proceed.  Follow Up Instructions: Follow up in 2 months   I discussed the assessment and treatment plan with the patient. The patient was provided an opportunity to ask questions and all were answered. The patient agreed with the plan and demonstrated an understanding of the instructions.   The patient was advised to call back or seek an in-person evaluation if the symptoms worsen or if the condition fails to improve as anticipated.  I provided 20 minutes of non-face-to-face time during this encounter.   Nanine Means, NP  Nanine Means, NP   Nanine Means, NP 12/28/2020, 10:59 AM  I reviewed chart and agreed with the findings and treatment Plan of the patient.  Kathryne Sharper, MD

## 2020-12-31 DIAGNOSIS — F3132 Bipolar disorder, current episode depressed, moderate: Secondary | ICD-10-CM | POA: Diagnosis not present

## 2020-12-31 DIAGNOSIS — F438 Other reactions to severe stress: Secondary | ICD-10-CM | POA: Diagnosis not present

## 2021-01-01 ENCOUNTER — Ambulatory Visit
Admission: RE | Admit: 2021-01-01 | Discharge: 2021-01-01 | Disposition: A | Discharge status: Home / Self Care | Payer: Medicaid Other | Source: Ambulatory Visit

## 2021-01-01 ENCOUNTER — Other Ambulatory Visit: Payer: Self-pay

## 2021-01-01 DIAGNOSIS — Z1231 Encounter for screening mammogram for malignant neoplasm of breast: Secondary | ICD-10-CM

## 2021-01-04 ENCOUNTER — Other Ambulatory Visit: Payer: Self-pay | Admitting: Family Medicine

## 2021-01-04 DIAGNOSIS — R928 Other abnormal and inconclusive findings on diagnostic imaging of breast: Secondary | ICD-10-CM

## 2021-01-07 DIAGNOSIS — F438 Other reactions to severe stress: Secondary | ICD-10-CM | POA: Diagnosis not present

## 2021-01-07 DIAGNOSIS — F3132 Bipolar disorder, current episode depressed, moderate: Secondary | ICD-10-CM | POA: Diagnosis not present

## 2021-01-14 DIAGNOSIS — F3132 Bipolar disorder, current episode depressed, moderate: Secondary | ICD-10-CM | POA: Diagnosis not present

## 2021-01-14 DIAGNOSIS — F438 Other reactions to severe stress: Secondary | ICD-10-CM | POA: Diagnosis not present

## 2021-01-21 DIAGNOSIS — F438 Other reactions to severe stress: Secondary | ICD-10-CM | POA: Diagnosis not present

## 2021-01-21 DIAGNOSIS — F3132 Bipolar disorder, current episode depressed, moderate: Secondary | ICD-10-CM | POA: Diagnosis not present

## 2021-01-22 ENCOUNTER — Other Ambulatory Visit: Payer: Medicaid Other

## 2021-01-25 NOTE — Progress Notes (Signed)
This encounter was created in error - please disregard.  This encounter was created in error - please disregard.

## 2021-01-28 DIAGNOSIS — F3132 Bipolar disorder, current episode depressed, moderate: Secondary | ICD-10-CM | POA: Diagnosis not present

## 2021-01-28 DIAGNOSIS — F438 Other reactions to severe stress: Secondary | ICD-10-CM | POA: Diagnosis not present

## 2021-01-30 ENCOUNTER — Other Ambulatory Visit: Payer: Self-pay | Admitting: Student in an Organized Health Care Education/Training Program

## 2021-02-02 ENCOUNTER — Encounter: Payer: Self-pay | Admitting: Student in an Organized Health Care Education/Training Program

## 2021-02-02 ENCOUNTER — Other Ambulatory Visit (HOSPITAL_COMMUNITY)
Admission: RE | Admit: 2021-02-02 | Discharge: 2021-02-02 | Disposition: A | Discharge status: Home / Self Care | Payer: Medicaid Other | Source: Ambulatory Visit | Attending: Family Medicine | Admitting: Family Medicine

## 2021-02-02 ENCOUNTER — Ambulatory Visit (INDEPENDENT_AMBULATORY_CARE_PROVIDER_SITE_OTHER): Payer: Medicaid Other | Admitting: Student in an Organized Health Care Education/Training Program

## 2021-02-02 ENCOUNTER — Other Ambulatory Visit: Payer: Self-pay

## 2021-02-02 VITALS — BP 140/90 | HR 77 | Ht 69.0 in | Wt 260.8 lb

## 2021-02-02 DIAGNOSIS — Z124 Encounter for screening for malignant neoplasm of cervix: Secondary | ICD-10-CM | POA: Diagnosis not present

## 2021-02-02 DIAGNOSIS — Z01419 Encounter for gynecological examination (general) (routine) without abnormal findings: Secondary | ICD-10-CM | POA: Diagnosis not present

## 2021-02-02 DIAGNOSIS — I10 Essential (primary) hypertension: Secondary | ICD-10-CM | POA: Diagnosis not present

## 2021-02-02 MED ORDER — ROSUVASTATIN CALCIUM 20 MG PO TABS: 20.0000 mg | ORAL_TABLET | Freq: Every day | ORAL | 3 refills | Status: DC

## 2021-02-02 MED ORDER — OLMESARTAN MEDOXOMIL 20 MG PO TABS: 20.0000 mg | ORAL_TABLET | Freq: Every day | ORAL | 3 refills | Status: DC

## 2021-02-02 NOTE — Patient Instructions (Signed)
It was a pleasure to see you today!  To summarize our discussion for this visit: We completed your pap smear today.  I have increased your blood pressure medication and we will check your electrolytes today. I have also added on a cholesterol medication but I'd still like you to work hard on increasing your activity level and diet for weight loss.  Come back in 1 month for a recheck.  Some additional health maintenance measures we should update are: Health Maintenance Due  Topic Date Due   Pneumococcal Vaccine 94-52 Years old (1 - PCV) Never done   PAP SMEAR-Modifier  05/29/2020     Please return to our clinic to see me in 1 month.  Call the clinic at [phone] if your symptoms worsen or you have any concerns.   Thank you for allowing me to take part in your care,  Dr. Jamelle Rushing

## 2021-02-02 NOTE — Progress Notes (Signed)
   SUBJECTIVE:   CHIEF COMPLAINT / HPI: pap  Pap smear- not sexually active. No concern for STIs. Asymptomatic.   HTN- has been adherent with olmesartan. Denies negative side effects. Asymptomatic. Home readings show systolics in 140s still as well.  Obesity- trying to eat better and be more active.   OBJECTIVE:   BP 140/90   Pulse 77   Ht 5\' 9"  (1.753 m)   Wt 260 lb 12.8 oz (118.3 kg)   SpO2 98%   BMI 38.51 kg/m   Physical Exam Vitals and nursing note reviewed. Exam conducted with a chaperone present.  Constitutional:      General: She is not in acute distress.    Appearance: She is obese. She is not ill-appearing or toxic-appearing.  Cardiovascular:     Rate and Rhythm: Normal rate and regular rhythm.  Pulmonary:     Effort: Pulmonary effort is normal.     Breath sounds: Normal breath sounds.  Genitourinary:    General: Normal vulva.     Exam position: Lithotomy position.     Labia:        Right: No rash or lesion.        Left: No rash or lesion.      Vagina: Normal.     Cervix: Normal.  Skin:    General: Skin is warm and dry.     Capillary Refill: Capillary refill takes less than 2 seconds.  Neurological:     Mental Status: She is alert.  Psychiatric:        Mood and Affect: Mood normal.        Behavior: Behavior normal.    ASSESSMENT/PLAN:   Essential hypertension, benign Asymptomatic. Endorses good adherence with treatment but remains elevated to 140s systolics intermittently.  Increase olmesartan today. Check cholesterol, BMP, CBC today ASCVD risk score 8.1%.  Recommended lifestyle changes and statin medication.  Patient prefers to start medication at this time.  Rosuvastatin 20mg  prescribed  Well woman exam Pap smear obtained today. Normal exam   The 10-year ASCVD risk score DC Jr., et al., 2013) is: 8.1%   Values used to calculate the score:     Age: 45 years     Sex: Female     Is Non-Hispanic African American: Yes     Diabetic: No      Tobacco smoker: Yes     Systolic Blood Pressure: 140 mmHg     Is BP treated: Yes     HDL Cholesterol: 41 mg/dL     Total Cholesterol: 145 mg/dL   2014, DO [facility]

## 2021-02-03 LAB — CBC
Hematocrit: 38.1 % (ref 34.0–46.6)
Hemoglobin: 10.8 g/dL — ABNORMAL LOW (ref 11.1–15.9)
MCH: 20.3 pg — ABNORMAL LOW (ref 26.6–33.0)
MCHC: 28.3 g/dL — ABNORMAL LOW (ref 31.5–35.7)
MCV: 72 fL — ABNORMAL LOW (ref 79–97)
Platelets: 333 10*3/uL (ref 150–450)
RBC: 5.32 x10E6/uL — ABNORMAL HIGH (ref 3.77–5.28)
RDW: 18.4 % — ABNORMAL HIGH (ref 11.7–15.4)
WBC: 9.5 10*3/uL (ref 3.4–10.8)

## 2021-02-03 LAB — COMPREHENSIVE METABOLIC PANEL
ALT: 11 IU/L (ref 0–32)
AST: 19 IU/L (ref 0–40)
Albumin/Globulin Ratio: 1.3 (ref 1.2–2.2)
Albumin: 4.4 g/dL (ref 3.8–4.8)
Alkaline Phosphatase: 68 IU/L (ref 44–121)
BUN/Creatinine Ratio: 11 (ref 9–23)
BUN: 9 mg/dL (ref 6–24)
Bilirubin Total: <0.2 mg/dL (ref 0.0–1.2)
CO2: 16 mmol/L — ABNORMAL LOW (ref 20–29)
Calcium: 9.1 mg/dL (ref 8.7–10.2)
Chloride: 104 mmol/L (ref 96–106)
Creatinine, Ser: 0.79 mg/dL (ref 0.57–1.00)
Globulin, Total: 3.4 g/dL (ref 1.5–4.5)
Glucose: 83 mg/dL (ref 65–99)
Potassium: 4.1 mmol/L (ref 3.5–5.2)
Sodium: 139 mmol/L (ref 134–144)
Total Protein: 7.8 g/dL (ref 6.0–8.5)
eGFR: 95 mL/min/{1.73_m2} (ref >59)

## 2021-02-04 ENCOUNTER — Other Ambulatory Visit: Payer: Medicaid Other

## 2021-02-04 DIAGNOSIS — F3132 Bipolar disorder, current episode depressed, moderate: Secondary | ICD-10-CM | POA: Diagnosis not present

## 2021-02-04 DIAGNOSIS — F438 Other reactions to severe stress: Secondary | ICD-10-CM | POA: Diagnosis not present

## 2021-02-04 NOTE — Assessment & Plan Note (Signed)
Pap smear obtained today. Normal exam

## 2021-02-04 NOTE — Assessment & Plan Note (Signed)
Asymptomatic. Endorses good adherence with treatment but remains elevated to 140s systolics intermittently.  Increase olmesartan today. Check cholesterol, BMP, CBC today ASCVD risk score 8.1%.  Recommended lifestyle changes and statin medication.  Patient prefers to start medication at this time.  Rosuvastatin 20mg  prescribed

## 2021-02-08 LAB — CYTOLOGY - PAP: Adequacy: ABNORMAL

## 2021-02-11 ENCOUNTER — Other Ambulatory Visit: Payer: Self-pay

## 2021-02-11 ENCOUNTER — Ambulatory Visit
Admission: RE | Admit: 2021-02-11 | Discharge: 2021-02-11 | Disposition: A | Discharge status: Home / Self Care | Payer: Medicaid Other | Source: Ambulatory Visit | Attending: Family Medicine | Admitting: Family Medicine

## 2021-02-11 ENCOUNTER — Encounter (HOSPITAL_COMMUNITY): Payer: Self-pay

## 2021-02-11 ENCOUNTER — Ambulatory Visit (INDEPENDENT_AMBULATORY_CARE_PROVIDER_SITE_OTHER): Payer: Medicaid Other

## 2021-02-11 ENCOUNTER — Ambulatory Visit (HOSPITAL_COMMUNITY)
Admission: EM | Admit: 2021-02-11 | Discharge: 2021-02-11 | Disposition: A | Discharge status: Home / Self Care | Payer: Medicaid Other | Attending: Physician Assistant | Admitting: Physician Assistant

## 2021-02-11 DIAGNOSIS — S6992XA Unspecified injury of left wrist, hand and finger(s), initial encounter: Secondary | ICD-10-CM | POA: Diagnosis not present

## 2021-02-11 DIAGNOSIS — M25642 Stiffness of left hand, not elsewhere classified: Secondary | ICD-10-CM

## 2021-02-11 DIAGNOSIS — M79645 Pain in left finger(s): Secondary | ICD-10-CM | POA: Diagnosis not present

## 2021-02-11 DIAGNOSIS — R928 Other abnormal and inconclusive findings on diagnostic imaging of breast: Secondary | ICD-10-CM

## 2021-02-11 MED ORDER — NAPROXEN 375 MG PO TABS: 375.0000 mg | ORAL_TABLET | Freq: Two times a day (BID) | ORAL | 0 refills | Status: DC

## 2021-02-11 NOTE — Discharge Instructions (Addendum)
Your x-ray was normal.  Keep finger splint on for several days.  You can ice finger to help with swelling.  Take Naprosyn twice daily for pain and inflammation.  Do not take other NSAIDs including aspirin, ibuprofen/Advil, naproxen/Aleve with this medication as it can cause stomach bleeding.  You can take Tylenol for breakthrough pain.  If anything worsens please return for reevaluation.  If your symptoms or not improving over the weekend call orthopedics as we discussed.

## 2021-02-11 NOTE — ED Triage Notes (Signed)
Patient c/o swelling in finger (left hand, 4th digit) and pain. Finger is swollen, not able to bend. Hit hand on fan yesterday.

## 2021-02-11 NOTE — ED Provider Notes (Signed)
[facility]    CSN: [id number] Arrival date & time: 02/11/21  1113      History   Chief Complaint Chief Complaint  Patient presents with   Swollen finger    HPI [name] is a 45 y.o. female.   Patient presents today with a 1 day history of worsening left finger swelling and pain.  She reports hitting her finger with a fan at home.  Immediately following this she had pain but was able to bend and use finger without difficulty.  Overnight this area became very swollen and she is now having difficulty with flexion as a result of swelling.  She reports associated pain which is rated 7 on a 0-10 pain scale, localized to left ring finger without radiation, described as aching/throbbing, worse with attempted movement or palpation, no alleviating factors identified.  She has not tried any over-the-counter medications for symptom management.  Denies any numbness or paresthesias.  Reports she is right-handed.   Past Medical History:  Diagnosis Date   Depression    Hypertension    Migraine    PTSD (post-traumatic stress disorder)    Vertigo     Patient Active Problem List   Diagnosis Date Noted   Shoulder pain 11/04/2020   Recurrent allergic otitis media of both ears 07/05/2020   PTSD (post-traumatic stress disorder) 05/13/2020   Cervical radiculopathy 11/07/2019   Bipolar 2 disorder (HCC) 03/25/2016   Vertigo 07/29/2014   Abnormal uterine bleeding (AUB) 11/26/2013   Essential hypertension, benign 09/27/2013   GERD (gastroesophageal reflux disease) 05/02/2013   Chronic lumbar pain 10/02/2012   Migraine without aura 07/05/2012   Anxiety state 08/18/2010   OBESITY NOS 01/26/2007    History reviewed. No pertinent surgical history.  OB History   No obstetric history on file.      Home Medications    Prior to Admission medications   Medication Sig Start Date End Date Taking? Authorizing Provider  naproxen (NAPROSYN) 375 MG tablet Take 1 tablet (375 mg  total) by mouth 2 (two) times daily. 02/11/21  Yes Trea Latner K, PA-C  olmesartan (BENICAR) 20 MG tablet Take 1 tablet (20 mg total) by mouth at bedtime. 02/02/21  Yes Anderson, Chelsey L, DO  EPINEPHrine 0.3 mg/0.3 mL IJ SOAJ injection Inject 0.3 mLs (0.3 mg total) into the muscle as needed for anaphylaxis. 04/05/19   Westley Chandler, MD  ferrous sulfate (FERROUSUL) 325 (65 FE) MG tablet Take 1 tablet (325 mg total) by mouth every other day. 01/08/20   Shirley, [place], DO  fluticasone (FLONASE) 50 MCG/ACT nasal spray Place 2 sprays into both nostrils daily. Patient taking differently: Place 2 sprays into both nostrils daily as needed for allergies.  11/28/18   Anderson, Chelsey L, DO  gabapentin (NEURONTIN) 100 MG capsule Take 1 capsule (100 mg total) by mouth 3 (three) times daily as needed. 12/28/20 12/28/21  Charm Rings, NP  hydrochlorothiazide (MICROZIDE) 12.5 MG capsule TAKE 1 CAPSULE BY MOUTH EVERY DAY 10/13/20   Jamelle Rushing L, DO  hydrocortisone 2.5 % ointment Use two times daily and apply with q-tip to external ear canal 07/03/20   Jamelle Rushing L, DO  hydrOXYzine (ATARAX/VISTARIL) 10 MG tablet Take 1 tablet (10 mg total) by mouth 3 (three) times daily as needed for anxiety. 12/28/20   Charm Rings, NP  loratadine (CLARITIN) 10 MG tablet TAKE 1 TABLET BY MOUTH EVERY DAY 07/24/20   Jamelle Rushing L, DO  meclizine (ANTIVERT) 12.5 MG tablet  Take 1 tablet (12.5 mg total) by mouth 3 (three) times daily as needed for dizziness. 11/05/19   Sandre Kittylson, Daniel K, MD  megestrol (MEGACE) 40 MG tablet Take 1 tablet (40 mg total) by mouth 2 (two) times daily. 07/03/20   Anderson, Chelsey L, DO  methocarbamol (ROBAXIN) 500 MG tablet Take 1 tablet (500 mg total) by mouth every 8 (eight) hours as needed for muscle spasms. 12/31/19   Petrucelli, Samantha R, PA-C  montelukast (SINGULAIR) 10 MG tablet Take 1 tablet (10 mg total) by mouth at bedtime. 11/02/17   Mikell, Antionette PolesAsiyah Zahra, MD  omeprazole (PRILOSEC) 20  MG capsule TAKE 1 CAPSULE BY MOUTH EVERY DAY 07/03/20   Jamelle RushingAnderson, Chelsey L, DO  Oxcarbazepine (TRILEPTAL) 300 MG tablet Take 1 tablet (300 mg total) by mouth 2 (two) times daily. 12/28/20   Charm RingsLord, Jamison Y, NP  polyethylene glycol (MIRALAX / GLYCOLAX) 17 g packet Take 17 g by mouth 3 (three) times daily. Until stooling regularly Patient taking differently: Take 17 g by mouth daily. Until stooling regularly 03/28/19   Dareen PianoAnderson, Chelsey L, DO  QUEtiapine (SEROQUEL) 25 MG tablet Take 1 tablet (25 mg total) by mouth at bedtime. 12/28/20 12/28/21  Charm RingsLord, Jamison Y, NP  rosuvastatin (CRESTOR) 20 MG tablet Take 1 tablet (20 mg total) by mouth daily. 02/02/21   Anderson, Chelsey L, DO  sertraline (ZOLOFT) 50 MG tablet Take 0.5 tablets (25 mg total) by mouth daily. 12/28/20 12/28/21  Charm RingsLord, Jamison Y, NP  sucralfate (CARAFATE) 1 GM/10ML suspension Take 10 mLs (1 g total) by mouth 4 (four) times daily -  with meals and at bedtime. 08/02/19   Anderson, Chelsey L, DO  tiZANidine (ZANAFLEX) 4 MG capsule Take 1 capsule (4 mg total) by mouth 2 (two) times daily as needed (for migraine, NOT BEFORE DRIVING). Do not take with flexeril 05/31/18   Howard PouchFeng, Lauren, MD    Family History Family History  Problem Relation Age of Onset   Hypertension Mother    Diabetes Mother    Heart Problems Mother    Healthy Father     Social History Social History   Tobacco Use   Smoking status: Former    Packs/day: 0.50    Years: 19.00    Pack years: 9.50    Types: Cigarettes    Quit date: 09/23/2019    Years since quitting: 1.3   Smokeless tobacco: Never  Vaping Use   Vaping Use: Never used  Substance Use Topics   Alcohol use: No   Drug use: No     Allergies   Aripiprazole, Escitalopram oxalate, Latex, and Shellfish allergy   Review of Systems Review of Systems  Constitutional:  Positive for activity change. Negative for appetite change, fatigue and fever.  Respiratory:  Negative for cough and shortness of breath.    Cardiovascular:  Negative for chest pain.  Gastrointestinal:  Negative for abdominal pain, diarrhea, nausea and vomiting.  Musculoskeletal:  Positive for arthralgias and joint swelling. Negative for myalgias.  Neurological:  Negative for dizziness, weakness, light-headedness, numbness and headaches.    Physical Exam Triage Vital Signs ED Triage Vitals  Enc Vitals Group     BP 02/11/21 1234 (!) 142/92     Pulse Rate 02/11/21 1234 70     Resp 02/11/21 1234 18     Temp 02/11/21 1234 99 F (37.2 C)     Temp Source 02/11/21 1234 Oral     SpO2 02/11/21 1234 98 %     Weight --  Height --      Head Circumference --      Peak Flow --      Pain Score 02/11/21 1237 7     Pain Loc --      Pain Edu? --      Excl. in GC? --    No data found.  Updated Vital Signs BP (!) 142/92 (BP Location: Left Arm)   Pulse 70   Temp 99 F (37.2 C) (Oral)   Resp 18   LMP 02/04/2021   SpO2 98%   Visual Acuity Right Eye Distance:   Left Eye Distance:   Bilateral Distance:    Right Eye Near:   Left Eye Near:    Bilateral Near:     Physical Exam Vitals reviewed.  Constitutional:      General: She is awake. She is not in acute distress.    Appearance: Normal appearance. She is normal weight. She is not ill-appearing.     Comments: Very pleasant female appears stated age in no acute distress  HENT:     Head: Normocephalic and atraumatic.  Cardiovascular:     Rate and Rhythm: Normal rate and regular rhythm.     Pulses:          Radial pulses are 2+ on the right side and 2+ on the left side.     Heart sounds: Normal heart sounds, S1 normal and S2 normal. No murmur heard.    Comments: Capillary refill within 2 seconds bilateral hands. Pulmonary:     Effort: Pulmonary effort is normal.     Breath sounds: Normal breath sounds. No wheezing, rhonchi or rales.     Comments: Clear to auscultation bilaterally Musculoskeletal:     Right hand: No bony tenderness. Normal range of motion. Normal  strength. Normal sensation. There is no disruption of two-point discrimination.     Left hand: Bony tenderness present. Decreased range of motion. Normal strength. Normal sensation. There is no disruption of two-point discrimination.     Comments: Left hand: Decreased flexion at DIP of left ring finger.  Significant swelling of left ring finger.  Normal sensation.  Hand neurovascularly intact.  No deformity noted.  Psychiatric:        Behavior: Behavior is cooperative.     UC Treatments / Results  Labs (all labs ordered are listed, but only abnormal results are displayed) Labs Reviewed - No data to display  EKG   Radiology DG Finger Ring Left  Result Date: 02/11/2021 CLINICAL DATA:  Decreased range of motion in the proximal interphalangeal joint, jammed finger yesterday. EXAM: LEFT RING FINGER 2+V COMPARISON:  None FINDINGS: No acute bone finding, fracture or dislocation. Soft tissues are unremarkable. IMPRESSION: Negative evaluation of the fourth digit. Electronically Signed   By: Donzetta Kohut M.D.   On: 02/11/2021 13:39    Procedures Procedures (including critical care time)  Medications Ordered in UC Medications - No data to display  Initial Impression / Assessment and Plan / UC Course  I have reviewed the triage vital signs and the nursing notes.  Pertinent labs & imaging results that were available during my care of the patient were reviewed by me and considered in my medical decision making (see chart for details).      X-ray showed no acute findings.  Suspect decreased range of motion secondary to swelling.  Patient was placed in finger splint and prescribed Naprosyn with instruction not to take additional NSAIDs due to risk of GI bleeding.  She can use Tylenol and ice for additional symptom relief.  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  Patient was given contact information for  orthopedic surgery should symptoms persist or not improve over the weekend.  Discussed alarm symptoms that warrant emergent evaluation.  Strict return precautions given to which patient expressed understanding.  Final Clinical Impressions(s) / UC Diagnoses   Final diagnoses:  Pain of finger of left hand  Injury of finger of left hand, initial encounter  Decreased range of motion of finger of left hand     Discharge Instructions      Your x-ray was normal.  Keep finger splint on for several days.  You can ice finger to help with swelling.  Take Naprosyn twice daily for pain and inflammation.  Do not take other NSAIDs including aspirin, ibuprofen/Advil, naproxen/Aleve with this medication as it can cause stomach bleeding.  You can take Tylenol for breakthrough pain.  If anything worsens please return for reevaluation.  If your symptoms or not improving over the weekend call orthopedics as we discussed.     ED Prescriptions     Medication Sig Dispense Auth. Provider   naproxen (NAPROSYN) 375 MG tablet Take 1 tablet (375 mg total) by mouth 2 (two) times daily. 20 tablet Halaina Vanduzer, Noberto Retort, PA-C      PDMP not reviewed this encounter.   Jeani Hawking, PA-C 02/11/21 1349

## 2021-02-16 ENCOUNTER — Ambulatory Visit: Payer: Medicaid Other | Admitting: Student in an Organized Health Care Education/Training Program

## 2021-02-25 DIAGNOSIS — F3132 Bipolar disorder, current episode depressed, moderate: Secondary | ICD-10-CM | POA: Diagnosis not present

## 2021-02-25 DIAGNOSIS — F438 Other reactions to severe stress: Secondary | ICD-10-CM | POA: Diagnosis not present

## 2021-03-03 ENCOUNTER — Encounter: Payer: Self-pay | Admitting: Family Medicine

## 2021-03-03 ENCOUNTER — Other Ambulatory Visit: Payer: Self-pay

## 2021-03-03 ENCOUNTER — Ambulatory Visit (INDEPENDENT_AMBULATORY_CARE_PROVIDER_SITE_OTHER): Payer: Medicaid Other | Admitting: Family Medicine

## 2021-03-03 VITALS — BP 120/75 | HR 90 | Ht 69.0 in | Wt 257.4 lb

## 2021-03-03 DIAGNOSIS — N939 Abnormal uterine and vaginal bleeding, unspecified: Secondary | ICD-10-CM

## 2021-03-03 DIAGNOSIS — I10 Essential (primary) hypertension: Secondary | ICD-10-CM | POA: Diagnosis not present

## 2021-03-03 NOTE — Progress Notes (Signed)
    SUBJECTIVE:   CHIEF COMPLAINT / HPI:   Ms. Cocke is a 45 yo who presents for follow up from her office visit with Dr. Dareen Piano on 6/14 where her Olmesartan was increased. She endorses feeling well and wasn't quite sure why she was scheduled for follow up. States she has been working on her diet - trying to eat a balanced diet but hard when son is is really picky. Trying not to bring junk food in the house. Endorses getting physical activity in with walking a lot. States she stopped smoking cigarettes a year ago. Was previously smoking 1 pack a day from age [age]. Does endorse chronic dizziness from vertigo. Denies CP, palpitations   OBJECTIVE:   BP 120/75   Pulse 90   Ht 5\' 9"  (1.753 m)   Wt 257 lb 6.4 oz (116.8 kg)   LMP 02/04/2021   SpO2 99%   BMI 38.01 kg/m    General: alert, pleasant, NAD CV: RRR no murmurs Resp: CTAB normal WOB GI: soft, non distended Derm: No visible lesions. No LE edema   ASSESSMENT/PLAN:   No problem-specific Assessment & Plan notes found for this encounter.   Hypertension BP today 120/75. Currently taking Olmesartan 20mg  daily and tolerating well. Discussed healthy eating and physical activity. Will check kidney function  - continue current regimen  - BMP, CBC    02/06/2021, DO [facility]

## 2021-03-04 DIAGNOSIS — F438 Other reactions to severe stress: Secondary | ICD-10-CM | POA: Diagnosis not present

## 2021-03-04 DIAGNOSIS — F3132 Bipolar disorder, current episode depressed, moderate: Secondary | ICD-10-CM | POA: Diagnosis not present

## 2021-03-04 LAB — CBC WITH DIFFERENTIAL/PLATELET
Basophils Absolute: 0 10*3/uL (ref 0.0–0.2)
Basos: 1 %
EOS (ABSOLUTE): 0 10*3/uL (ref 0.0–0.4)
Eos: 0 %
Hematocrit: 36.3 % (ref 34.0–46.6)
Hemoglobin: 11 g/dL — ABNORMAL LOW (ref 11.1–15.9)
Immature Grans (Abs): 0 10*3/uL (ref 0.0–0.1)
Immature Granulocytes: 0 %
Lymphocytes Absolute: 1.7 10*3/uL (ref 0.7–3.1)
Lymphs: 23 %
MCH: 20.9 pg — ABNORMAL LOW (ref 26.6–33.0)
MCHC: 30.3 g/dL — ABNORMAL LOW (ref 31.5–35.7)
MCV: 69 fL — ABNORMAL LOW (ref 79–97)
Monocytes Absolute: 0.6 10*3/uL (ref 0.1–0.9)
Monocytes: 8 %
Neutrophils Absolute: 4.9 10*3/uL (ref 1.4–7.0)
Neutrophils: 68 %
Platelets: 275 10*3/uL (ref 150–450)
RBC: 5.26 x10E6/uL (ref 3.77–5.28)
RDW: 18.6 % — ABNORMAL HIGH (ref 11.7–15.4)
WBC: 7.3 10*3/uL (ref 3.4–10.8)

## 2021-03-04 LAB — BASIC METABOLIC PANEL
BUN/Creatinine Ratio: 11 (ref 9–23)
BUN: 8 mg/dL (ref 6–24)
CO2: 21 mmol/L (ref 20–29)
Calcium: 9 mg/dL (ref 8.7–10.2)
Chloride: 101 mmol/L (ref 96–106)
Creatinine, Ser: 0.71 mg/dL (ref 0.57–1.00)
Glucose: 109 mg/dL — ABNORMAL HIGH (ref 65–99)
Potassium: 4.5 mmol/L (ref 3.5–5.2)
Sodium: 137 mmol/L (ref 134–144)
eGFR: 108 mL/min/{1.73_m2} (ref >59)

## 2021-03-04 NOTE — Patient Instructions (Signed)
It was great seeing you today! Today you came to follow up on your blood pressure and recent increase in medication. Your BP is stable and we will continue current regimen. We will check your kidney function today. I will call you with any abnormal results   Feel free to call with any questions or concerns at any time, at [phone].   Take care,  Dr. Cora Collum [facility]

## 2021-03-11 DIAGNOSIS — F3132 Bipolar disorder, current episode depressed, moderate: Secondary | ICD-10-CM | POA: Diagnosis not present

## 2021-03-11 DIAGNOSIS — F438 Other reactions to severe stress: Secondary | ICD-10-CM | POA: Diagnosis not present

## 2021-03-13 ENCOUNTER — Telehealth: Payer: Self-pay | Admitting: Family Medicine

## 2021-03-13 NOTE — Telephone Encounter (Signed)
Late phone entry  Called patient to discuss results of hgb of 11 she has been dealing with abnormal menses for a while and has had low hgb for the past year. She reports still taking iron 325 qod. She has dealt with some fatigue chronically but currently is not feeling any worse or with any other complaints. Patient to schedule follow up in the next 2 months to check hgb again and we can probe more into this and potentially schedule [place] or see if we need to do further work up.

## 2021-03-18 DIAGNOSIS — F438 Other reactions to severe stress: Secondary | ICD-10-CM | POA: Diagnosis not present

## 2021-03-18 DIAGNOSIS — F3132 Bipolar disorder, current episode depressed, moderate: Secondary | ICD-10-CM | POA: Diagnosis not present

## 2021-03-25 DIAGNOSIS — F438 Other reactions to severe stress: Secondary | ICD-10-CM | POA: Diagnosis not present

## 2021-03-25 DIAGNOSIS — F3132 Bipolar disorder, current episode depressed, moderate: Secondary | ICD-10-CM | POA: Diagnosis not present

## 2021-04-22 DIAGNOSIS — F3132 Bipolar disorder, current episode depressed, moderate: Secondary | ICD-10-CM | POA: Diagnosis not present

## 2021-04-22 DIAGNOSIS — F438 Other reactions to severe stress: Secondary | ICD-10-CM | POA: Diagnosis not present

## 2021-04-29 DIAGNOSIS — F3132 Bipolar disorder, current episode depressed, moderate: Secondary | ICD-10-CM | POA: Diagnosis not present

## 2021-04-29 DIAGNOSIS — F438 Other reactions to severe stress: Secondary | ICD-10-CM | POA: Diagnosis not present

## 2021-05-06 ENCOUNTER — Other Ambulatory Visit: Payer: Self-pay

## 2021-05-06 ENCOUNTER — Ambulatory Visit (HOSPITAL_COMMUNITY)
Admission: EM | Admit: 2021-05-06 | Discharge: 2021-05-06 | Disposition: A | Discharge status: Home / Self Care | Payer: Medicaid Other | Attending: Family Medicine | Admitting: Family Medicine

## 2021-05-06 ENCOUNTER — Encounter (HOSPITAL_COMMUNITY): Payer: Self-pay

## 2021-05-06 DIAGNOSIS — L720 Epidermal cyst: Secondary | ICD-10-CM | POA: Diagnosis not present

## 2021-05-06 DIAGNOSIS — F438 Other reactions to severe stress: Secondary | ICD-10-CM | POA: Diagnosis not present

## 2021-05-06 DIAGNOSIS — F3132 Bipolar disorder, current episode depressed, moderate: Secondary | ICD-10-CM | POA: Diagnosis not present

## 2021-05-06 MED ORDER — DOXYCYCLINE HYCLATE 100 MG PO CAPS
100.0000 mg | ORAL_CAPSULE | Freq: Two times a day (BID) | ORAL | 0 refills | Status: DC
Start: 2021-05-06 — End: 2021-12-24

## 2021-05-06 MED ORDER — LIDOCAINE HCL 2 % IJ SOLN
INTRAMUSCULAR | Status: AC
  Filled 2021-05-06: qty 20

## 2021-05-06 NOTE — ED Triage Notes (Signed)
Pt presents with complaints of an abscess that has been present on her back x 1 week. Reports it is in the same area a previous one was.

## 2021-05-06 NOTE — Discharge Instructions (Addendum)
If not allergic, you may use over the counter ibuprofen or acetaminophen as needed. ° °

## 2021-05-06 NOTE — ED Provider Notes (Signed)
[facility]   [id number] 05/06/21 Arrival Time: 1343  ASSESSMENT & PLAN:  1. Epidermoid cyst of skin of back    Incision and Drainage Procedure Note  Anesthesia: 1% plain lidocaine  Procedure Details  The procedure, risks and complications have been discussed in detail (including, but not limited to pain and bleeding) with the patient.  The skin induration was prepped and draped in the usual fashion. After adequate local anesthesia, I&D with a #11 blade was performed on the left upper back with copious, bloody, purulent, cystic  drainage.  EBL: minimal Drains: none Packing: 1/4" Condition: Tolerated procedure well Complications: none.  Begin: Meds ordered this encounter  Medications   doxycycline (VIBRAMYCIN) 100 MG capsule    Sig: Take 1 capsule (100 mg total) by mouth 2 (two) times daily.    Dispense:  14 capsule    Refill:  0   Wound care instructions discussed and given in written format. To return in 48 hours for wound check and packing removal.  Finish all antibiotics. OTC analgesics as needed.  Reviewed expectations re: course of current medical issues. Questions answered. Outlined signs and symptoms indicating need for more acute intervention. Patient verbalized understanding. After Visit Summary given.   SUBJECTIVE:  [name] is a 45 y.o. female who presents with a possible infection of her L upper back. Onset gradual, over past weekwithout active drainage and without active bleeding. Symptoms have gradually worsened since beginning. Fever: absent. No tx PTA.   OBJECTIVE:  Vitals:   05/06/21 1433  BP: 126/84  Pulse: 80  Resp: 19  Temp: 98.9 F (37.2 C)  SpO2: 96%    General appearance: alert; no distress Upper L back: approx 1.5 cm induration; tender to palpation; no active drainage or bleeding; mild overlying erythema Psychological: alert and cooperative; normal mood and affect  Allergies  Allergen Reactions   Aripiprazole      REACTION: Disoriented.   Escitalopram Oxalate     REACTION: disoriented.   Latex Itching   Shellfish Allergy     Throat itching    Past Medical History:  Diagnosis Date   Depression    Hypertension    Migraine    PTSD (post-traumatic stress disorder)    Vertigo    Social History   Socioeconomic History   Marital status: Single    Spouse name: Not on file   Number of children: Not on file   Years of education: Not on file   Highest education level: Not on file  Occupational History   Not on file  Tobacco Use   Smoking status: Former    Packs/day: 0.50    Years: 19.00    Pack years: 9.50    Types: Cigarettes    Quit date: 09/23/2019    Years since quitting: 1.6   Smokeless tobacco: Never  Vaping Use   Vaping Use: Never used  Substance and Sexual Activity   Alcohol use: No   Drug use: No   Sexual activity: Yes    Birth control/protection: None  Other Topics Concern   Not on file  Social History Narrative   Not on file   Social Determinants of Health   Financial Resource Strain: Not on file  Food Insecurity: Not on file  Transportation Needs: Not on file  Physical Activity: Not on file  Stress: Not on file  Social Connections: Not on file   Family History  Problem Relation Age of Onset   Hypertension Mother  Diabetes Mother    Heart Problems Mother    Healthy Father    History reviewed. No pertinent surgical history.          Mardella Layman, MD 05/06/21 1550

## 2021-05-09 ENCOUNTER — Ambulatory Visit (HOSPITAL_COMMUNITY): Payer: Medicaid Other

## 2021-05-10 ENCOUNTER — Telehealth: Payer: Self-pay | Admitting: Family Medicine

## 2021-05-10 NOTE — Telephone Encounter (Signed)
After hours/emergency line  Patient has been feeling congested with rhinorrhea and sneezing for the past few days. Also has productive cough with white phlegm. Has been trying to take her allergy meds and this is not working. Has developed a fever of temperature 101.4, took COVID testing yesterday and it was negative. Denies dyspnea. Endorses voice changes, went to work today since prior COVID test was negative yesterday. Sick contacts include boyfriend, he did not get tested. Denies nausea, vomiting and diarrhea. Recommended that patient get tested again for COVID as previous testing may have been too early as she developed a fever today. Also recommended social distancing along with plenty of fluids and rest. Return precautions discussed, urged patient to get further evaluation in the ED if she develops dyspnea or respiratory distress. Patient voiced clear understanding of this plan.

## 2021-05-13 DIAGNOSIS — F438 Other reactions to severe stress: Secondary | ICD-10-CM | POA: Diagnosis not present

## 2021-05-13 DIAGNOSIS — F3132 Bipolar disorder, current episode depressed, moderate: Secondary | ICD-10-CM | POA: Diagnosis not present

## 2021-05-18 DIAGNOSIS — F438 Other reactions to severe stress: Secondary | ICD-10-CM | POA: Diagnosis not present

## 2021-05-18 DIAGNOSIS — F3132 Bipolar disorder, current episode depressed, moderate: Secondary | ICD-10-CM | POA: Diagnosis not present

## 2021-05-29 ENCOUNTER — Other Ambulatory Visit: Payer: Self-pay | Admitting: Student in an Organized Health Care Education/Training Program

## 2021-06-05 DIAGNOSIS — F3132 Bipolar disorder, current episode depressed, moderate: Secondary | ICD-10-CM | POA: Diagnosis not present

## 2021-06-05 DIAGNOSIS — F411 Generalized anxiety disorder: Secondary | ICD-10-CM | POA: Diagnosis not present

## 2021-06-19 DIAGNOSIS — F3132 Bipolar disorder, current episode depressed, moderate: Secondary | ICD-10-CM | POA: Diagnosis not present

## 2021-06-19 DIAGNOSIS — F411 Generalized anxiety disorder: Secondary | ICD-10-CM | POA: Diagnosis not present

## 2021-06-24 DIAGNOSIS — F411 Generalized anxiety disorder: Secondary | ICD-10-CM | POA: Diagnosis not present

## 2021-06-24 DIAGNOSIS — F3132 Bipolar disorder, current episode depressed, moderate: Secondary | ICD-10-CM | POA: Diagnosis not present

## 2021-06-28 ENCOUNTER — Encounter: Payer: Self-pay | Admitting: Family Medicine

## 2021-06-28 DIAGNOSIS — J301 Allergic rhinitis due to pollen: Secondary | ICD-10-CM

## 2021-06-29 MED ORDER — LORATADINE 10 MG PO TABS: 10.0000 mg | ORAL_TABLET | Freq: Every day | ORAL | 0 refills | Status: DC

## 2021-06-29 MED ORDER — MONTELUKAST SODIUM 10 MG PO TABS: 10.0000 mg | ORAL_TABLET | Freq: Every day | ORAL | 3 refills | Status: DC

## 2021-07-03 DIAGNOSIS — F3132 Bipolar disorder, current episode depressed, moderate: Secondary | ICD-10-CM | POA: Diagnosis not present

## 2021-07-03 DIAGNOSIS — F411 Generalized anxiety disorder: Secondary | ICD-10-CM | POA: Diagnosis not present

## 2021-07-09 ENCOUNTER — Other Ambulatory Visit: Payer: Self-pay

## 2021-07-09 ENCOUNTER — Encounter: Payer: Self-pay | Admitting: Family Medicine

## 2021-07-09 ENCOUNTER — Ambulatory Visit (INDEPENDENT_AMBULATORY_CARE_PROVIDER_SITE_OTHER): Payer: Medicaid Other | Admitting: Family Medicine

## 2021-07-09 VITALS — BP 130/80 | HR 100 | Wt 245.4 lb

## 2021-07-09 DIAGNOSIS — M5412 Radiculopathy, cervical region: Secondary | ICD-10-CM | POA: Diagnosis present

## 2021-07-09 MED ORDER — NAPROXEN 375 MG PO TABS: 375.0000 mg | ORAL_TABLET | Freq: Two times a day (BID) | ORAL | 0 refills | Status: DC | PRN

## 2021-07-09 NOTE — Patient Instructions (Addendum)
It was nice seeing you today!  Go to [facility] imaging for your X-ray. You do not need an appointment. [address] [place], [place] [address]  Take naproxen twice a day as needed.  Do neck exercises at home at least 5 days a week if you are able to. Focus on range of motion exercises.  Follow-up in 4-6 weeks.  Please arrive at least 15 minutes prior to your scheduled appointments.  Stay well, Littie Deeds, MD [facility] [phone]

## 2021-07-09 NOTE — Progress Notes (Signed)
    SUBJECTIVE:   CHIEF COMPLAINT / HPI: numbness  Patient reports worsening numbness in her bilateral upper extremities for the past few weeks.  Patient states she has had ongoing issues with neck pain for many years and has had numbness going into her right arm for several months.  She has numbness in her arm if she lays on it at night, so she has been sleeping on her back.  She sometimes has numbness during the day as well which she feels is coming from her neck.  Previously had naproxen which was helping but has run out.  She is taking ibuprofen which does not provide much relief.  She does not have any wrist pain and denies flicking her hands.  She will rub her arms which improves the tingling.  PERTINENT  PMH / PSH: Cervical radiculopathy  OBJECTIVE:   BP 130/80   Pulse 100   Wt 245 lb 6 oz (111.3 kg)   LMP 07/02/2021   SpO2 100%   BMI 36.24 kg/m   General: Obese middle-aged female, NAD CV: RRR, no murmurs Pulm: CTAB, no wheezes or rales MSK: No tenderness along midline cervical spine or paraspinal muscles.  Phalen negative.  Spurling's negative. Neuro: 5/5 strength with shoulder abduction, shoulder abduction, elbow flexion, elbow extension, wrist flexion, wrist extension.  Gross sensation is intact throughout.  2+ biceps reflexes.  Difficult to elicit triceps reflex.  ASSESSMENT/PLAN:   Cervical radiculopathy Chronic neck pain now with bilateral paresthesias/ numbness.  Strength is preserved.  Suspect cervical radiculopathy, lower suspicion for carpal tunnel. - XR cervical spine (previously ordered, address given to get this done) - refilled naproxen - home exercises given (patient declined PT today, difficult to get to appointments due to work) - f/u 4-6 weeks     Littie Deeds, MD [facility]

## 2021-07-09 NOTE — Assessment & Plan Note (Signed)
Chronic neck pain now with bilateral paresthesias/ numbness.  Strength is preserved.  Suspect cervical radiculopathy, lower suspicion for carpal tunnel. - XR cervical spine (previously ordered, address given to get this done) - refilled naproxen - home exercises given (patient declined PT today, difficult to get to appointments due to work) - f/u 4-6 weeks

## 2021-07-16 ENCOUNTER — Other Ambulatory Visit: Payer: Self-pay | Admitting: Family Medicine

## 2021-07-20 MED ORDER — EPINEPHRINE 0.3 MG/0.3ML IJ SOAJ: 0.3000 mg | INTRAMUSCULAR | 0 refills | Status: DC | PRN

## 2021-07-22 DIAGNOSIS — F411 Generalized anxiety disorder: Secondary | ICD-10-CM | POA: Diagnosis not present

## 2021-07-22 DIAGNOSIS — F3132 Bipolar disorder, current episode depressed, moderate: Secondary | ICD-10-CM | POA: Diagnosis not present

## 2021-08-12 ENCOUNTER — Other Ambulatory Visit: Payer: Self-pay | Admitting: Family Medicine

## 2021-08-24 DIAGNOSIS — F3132 Bipolar disorder, current episode depressed, moderate: Secondary | ICD-10-CM | POA: Diagnosis not present

## 2021-08-24 DIAGNOSIS — F411 Generalized anxiety disorder: Secondary | ICD-10-CM | POA: Diagnosis not present

## 2021-09-02 DIAGNOSIS — F411 Generalized anxiety disorder: Secondary | ICD-10-CM | POA: Diagnosis not present

## 2021-09-02 DIAGNOSIS — F3132 Bipolar disorder, current episode depressed, moderate: Secondary | ICD-10-CM | POA: Diagnosis not present

## 2021-09-16 DIAGNOSIS — F3132 Bipolar disorder, current episode depressed, moderate: Secondary | ICD-10-CM | POA: Diagnosis not present

## 2021-09-16 DIAGNOSIS — F411 Generalized anxiety disorder: Secondary | ICD-10-CM | POA: Diagnosis not present

## 2021-09-30 DIAGNOSIS — F411 Generalized anxiety disorder: Secondary | ICD-10-CM | POA: Diagnosis not present

## 2021-09-30 DIAGNOSIS — F3132 Bipolar disorder, current episode depressed, moderate: Secondary | ICD-10-CM | POA: Diagnosis not present

## 2021-10-06 ENCOUNTER — Other Ambulatory Visit: Payer: Self-pay | Admitting: Student in an Organized Health Care Education/Training Program

## 2021-10-06 DIAGNOSIS — G43701 Chronic migraine without aura, not intractable, with status migrainosus: Secondary | ICD-10-CM

## 2021-10-14 DIAGNOSIS — F411 Generalized anxiety disorder: Secondary | ICD-10-CM | POA: Diagnosis not present

## 2021-10-14 DIAGNOSIS — F3132 Bipolar disorder, current episode depressed, moderate: Secondary | ICD-10-CM | POA: Diagnosis not present

## 2021-10-17 ENCOUNTER — Telehealth: Payer: Self-pay | Admitting: Family Medicine

## 2021-10-17 MED ORDER — ONDANSETRON 4 MG PO TBDP: 4.0000 mg | ORAL_TABLET | Freq: Three times a day (TID) | ORAL | 0 refills | Status: DC | PRN

## 2021-10-17 NOTE — Telephone Encounter (Signed)
**  After Hours/ Emergency Line Call**  Received a call to report that Linward Headland is having severe diarrhea and vomiting. 3-4 hours ago she consumed food from [employer] and starting having severe cramping in her stomach followed by the vomiting and diarrhea. She has not had any blood in her vomit or stools, cough, congestion, fevers. She is not currently able to drink any water. At this time, will prescribe Zofran ODT, which patient states she has had before and prior EKGs were evaluated and medication appropriate. Counseled patient on return precautions  if she is not able to keep up hydration or if there is no improvement in the next 24-48 hours.    Red flags discussed.  Will forward to PCP.  Evelena Leyden, DO  PGY-2, [facility] 10/17/2021 8:58 PM

## 2021-10-28 DIAGNOSIS — F411 Generalized anxiety disorder: Secondary | ICD-10-CM | POA: Diagnosis not present

## 2021-10-28 DIAGNOSIS — F3132 Bipolar disorder, current episode depressed, moderate: Secondary | ICD-10-CM | POA: Diagnosis not present

## 2021-10-30 ENCOUNTER — Encounter: Payer: Self-pay | Admitting: Family Medicine

## 2021-10-31 ENCOUNTER — Other Ambulatory Visit: Payer: Self-pay | Admitting: Family Medicine

## 2021-10-31 DIAGNOSIS — J301 Allergic rhinitis due to pollen: Secondary | ICD-10-CM

## 2021-11-05 ENCOUNTER — Other Ambulatory Visit: Payer: Self-pay | Admitting: Family Medicine

## 2021-11-11 DIAGNOSIS — F3132 Bipolar disorder, current episode depressed, moderate: Secondary | ICD-10-CM | POA: Diagnosis not present

## 2021-11-11 DIAGNOSIS — F411 Generalized anxiety disorder: Secondary | ICD-10-CM | POA: Diagnosis not present

## 2021-11-25 DIAGNOSIS — F3132 Bipolar disorder, current episode depressed, moderate: Secondary | ICD-10-CM | POA: Diagnosis not present

## 2021-11-25 DIAGNOSIS — F411 Generalized anxiety disorder: Secondary | ICD-10-CM | POA: Diagnosis not present

## 2021-12-09 DIAGNOSIS — F411 Generalized anxiety disorder: Secondary | ICD-10-CM | POA: Diagnosis not present

## 2021-12-09 DIAGNOSIS — F3132 Bipolar disorder, current episode depressed, moderate: Secondary | ICD-10-CM | POA: Diagnosis not present

## 2021-12-17 ENCOUNTER — Ambulatory Visit: Payer: Medicaid Other | Admitting: Family Medicine

## 2021-12-24 ENCOUNTER — Ambulatory Visit (INDEPENDENT_AMBULATORY_CARE_PROVIDER_SITE_OTHER): Payer: Medicaid Other | Admitting: Family Medicine

## 2021-12-24 VITALS — BP 128/80 | HR 81 | Wt 243.0 lb

## 2021-12-24 DIAGNOSIS — H61893 Other specified disorders of external ear, bilateral: Secondary | ICD-10-CM | POA: Diagnosis not present

## 2021-12-24 DIAGNOSIS — R42 Dizziness and giddiness: Secondary | ICD-10-CM | POA: Diagnosis not present

## 2021-12-24 DIAGNOSIS — J301 Allergic rhinitis due to pollen: Secondary | ICD-10-CM

## 2021-12-24 MED ORDER — CETIRIZINE HCL 10 MG PO TABS: 10.0000 mg | ORAL_TABLET | Freq: Every day | ORAL | 0 refills | Status: DC

## 2021-12-24 MED ORDER — MECLIZINE HCL 12.5 MG PO TABS: 12.5000 mg | ORAL_TABLET | Freq: Three times a day (TID) | ORAL | 0 refills | Status: DC | PRN

## 2021-12-24 NOTE — Progress Notes (Signed)
? ? ?  SUBJECTIVE:  ? ?CHIEF COMPLAINT / HPI:  ? ?Ear drainage ?Notice bilateral ear drainage several days. States she has seen ENT in the past and was given oil ear drops for dry ear canals. Experiencing chronic sore throat and itchy eyes. Taking claritin, flonase, singulair, pataday eye drops without relief. Denies fever, shortness of breath. Hx of vertigo and has had eply's maneuver done by ENT in the past. Denies dizziness at this time.  ? ?PERTINENT  PMH / PSH: GERD, allergic otitis media ? ?OBJECTIVE:  ? ?BP 128/80   Pulse 81   Wt 243 lb (110.2 kg)   SpO2 98%   BMI 35.88 kg/m?   ?Physical Exam ?Vitals reviewed.  ?Constitutional:   ?   General: She is not in acute distress. ?   Appearance: She is not ill-appearing, toxic-appearing or diaphoretic.  ?HENT:  ?   Right Ear: Tympanic membrane, ear canal and external ear normal.  ?   Left Ear: Tympanic membrane, ear canal and external ear normal.  ?   Nose: No rhinorrhea.  ?   Mouth/Throat:  ?   Pharynx: Oropharynx is clear. No oropharyngeal exudate or posterior oropharyngeal erythema.  ?Eyes:  ?   Conjunctiva/sclera: Conjunctivae normal.  ?Lymphadenopathy:  ?   Cervical: No cervical adenopathy.  ?Neurological:  ?   Mental Status: She is alert and oriented to person, place, and time.  ?Psychiatric:     ?   Mood and Affect: Mood normal.     ?   Behavior: Behavior normal.  ? ?ASSESSMENT/PLAN:  ? ?Seasonal allergic rhinitis due to pollen  Dryness of both ear canals  Vertigo ?Normal exam today. Will switch out claritin for zyrtec to achieve some symptom relieft. Will refer to ENT at this time given complicated hx and some improvement with specialist in the past.  ?- cetirizine (ZYRTEC ALLERGY) 10 MG tablet; Take 1 tablet (10 mg total) by mouth daily.  Dispense: 90 tablet; Refill: 0 ?- meclizine (ANTIVERT) 12.5 MG tablet; Take 1 tablet (12.5 mg total) by mouth 3 (three) times daily as needed for dizziness.  Dispense: 30 tablet; Refill: 0 ?- Ambulatory referral to  ENT ?cd ? ? ?Latise Dilley Autry-Lott, DO ?[facility]  ?

## 2021-12-24 NOTE — Patient Instructions (Addendum)
It was wonderful to see you today. ? ?Please bring ALL of your medications with you to every visit.  ? ?Today we talked about: ? ?Stopping Claritin and starting zyrtec.  ?Your ear canal is dry but appears normal.  ?I have placed a referral to ENT.  ? ?Please be sure to schedule follow up at the front  desk before you leave today.  ? ?If you haven't already, sign up for My Chart to have easy access to your labs results, and communication with your primary care physician. ? ?Please call the clinic at [phone] if your symptoms worsen or you have any concerns. It was our pleasure to serve you. ? ?Dr. Salvadore Dom ? ?

## 2021-12-28 ENCOUNTER — Other Ambulatory Visit: Payer: Self-pay | Admitting: Family Medicine

## 2021-12-28 DIAGNOSIS — J301 Allergic rhinitis due to pollen: Secondary | ICD-10-CM

## 2022-01-14 ENCOUNTER — Ambulatory Visit (INDEPENDENT_AMBULATORY_CARE_PROVIDER_SITE_OTHER): Payer: Medicaid Other | Admitting: Family Medicine

## 2022-01-14 ENCOUNTER — Encounter: Payer: Self-pay | Admitting: Family Medicine

## 2022-01-14 VITALS — BP 139/80 | HR 66 | Temp 98.4°F | Wt 252.0 lb

## 2022-01-14 DIAGNOSIS — R221 Localized swelling, mass and lump, neck: Secondary | ICD-10-CM | POA: Diagnosis not present

## 2022-01-14 DIAGNOSIS — R5383 Other fatigue: Secondary | ICD-10-CM | POA: Diagnosis not present

## 2022-01-14 NOTE — Progress Notes (Signed)
    SUBJECTIVE:   CHIEF COMPLAINT / HPI:   Lump under chin, fatigue Felt a lump under her chin a few days ago. It is painless and was not noticed before now. She has been feeling fatigued for the past few day with not wanting to get out of bed. She generally feels unwell. She denies any known sick contacts. She denies fever, weight loss and night sweats. She denies recent illness.   PERTINENT  PMH / PSH: As above.   OBJECTIVE:   BP 139/80   Pulse 66   Temp 98.4 F (36.9 C)   Wt 252 lb (114.3 kg)   LMP 12/20/2021 (Approximate)   SpO2 96%   BMI 37.21 kg/m   General: Appears well, no acute distress. Age appropriate. HEENT: Normocephalic. White scleral. Thyroid normal size and contour. Submandibular mobile oval like rubbery mass about 1-1.5cm in size. Cardiac: RRR, normal heart sounds, no murmurs Respiratory: CTAB, normal effort Skin: Warm and dry, no rashes noted Neuro: alert and oriented, no focal deficits Psych: normal affect   ASSESSMENT/PLAN:   Fatigue, unspecified type, Lump in neck Acute symptoms with lymphadenopathy. This feels like a swollen submandibular lymph node. Other than fatigue, no other systemic symptoms. Consider viral v. Endocrine v. Malignancy. Will obtain labs as below. Encouraged to monitor lump and follow up to be determined following lab work up.  - TSH - CBC with Differential - Basic Metabolic Panel  Lavonda Jumbo, DO [facility]

## 2022-01-15 LAB — BASIC METABOLIC PANEL
BUN/Creatinine Ratio: 14 (ref 9–23)
BUN: 9 mg/dL (ref 6–24)
CO2: 24 mmol/L (ref 20–29)
Calcium: 9.1 mg/dL (ref 8.7–10.2)
Chloride: 104 mmol/L (ref 96–106)
Creatinine, Ser: 0.66 mg/dL (ref 0.57–1.00)
Glucose: 93 mg/dL (ref 70–99)
Potassium: 4.2 mmol/L (ref 3.5–5.2)
Sodium: 138 mmol/L (ref 134–144)
eGFR: 111 mL/min/{1.73_m2} (ref >59)

## 2022-01-15 LAB — CBC WITH DIFFERENTIAL/PLATELET
Basophils Absolute: 0 10*3/uL (ref 0.0–0.2)
Basos: 0 %
EOS (ABSOLUTE): 0.1 10*3/uL (ref 0.0–0.4)
Eos: 1 %
Hematocrit: 36 % (ref 34.0–46.6)
Hemoglobin: 11.5 g/dL (ref 11.1–15.9)
Immature Grans (Abs): 0 10*3/uL (ref 0.0–0.1)
Immature Granulocytes: 0 %
Lymphocytes Absolute: 2.5 10*3/uL (ref 0.7–3.1)
Lymphs: 30 %
MCH: 22.5 pg — ABNORMAL LOW (ref 26.6–33.0)
MCHC: 31.9 g/dL (ref 31.5–35.7)
MCV: 71 fL — ABNORMAL LOW (ref 79–97)
Monocytes Absolute: 0.8 10*3/uL (ref 0.1–0.9)
Monocytes: 10 %
Neutrophils Absolute: 4.9 10*3/uL (ref 1.4–7.0)
Neutrophils: 59 %
Platelets: 280 10*3/uL (ref 150–450)
RBC: 5.11 x10E6/uL (ref 3.77–5.28)
RDW: 15.1 % (ref 11.7–15.4)
WBC: 8.3 10*3/uL (ref 3.4–10.8)

## 2022-01-15 LAB — TSH: TSH: 1.52 u[IU]/mL (ref 0.450–4.500)

## 2022-01-25 ENCOUNTER — Encounter: Payer: Self-pay | Admitting: *Deleted

## 2022-04-02 ENCOUNTER — Other Ambulatory Visit: Payer: Self-pay

## 2022-04-03 MED ORDER — OLMESARTAN MEDOXOMIL 20 MG PO TABS: 20.0000 mg | ORAL_TABLET | Freq: Every day | ORAL | 3 refills | Status: DC

## 2022-04-29 ENCOUNTER — Other Ambulatory Visit: Payer: Self-pay | Admitting: Family Medicine

## 2022-04-29 DIAGNOSIS — J301 Allergic rhinitis due to pollen: Secondary | ICD-10-CM

## 2022-07-19 ENCOUNTER — Other Ambulatory Visit: Payer: Self-pay | Admitting: Family Medicine

## 2022-07-19 MED ORDER — EPINEPHRINE 0.3 MG/0.3ML IJ SOAJ: 0.3000 mg | INTRAMUSCULAR | 1 refills | Status: DC | PRN

## 2022-08-10 ENCOUNTER — Other Ambulatory Visit: Payer: Self-pay | Admitting: Student

## 2022-08-10 DIAGNOSIS — J301 Allergic rhinitis due to pollen: Secondary | ICD-10-CM

## 2022-09-06 ENCOUNTER — Ambulatory Visit: Payer: Medicaid Other | Admitting: Family Medicine

## 2022-09-06 NOTE — Progress Notes (Deleted)
    SUBJECTIVE:   CHIEF COMPLAINT / HPI: migraines  ***  PERTINENT  PMH / PSH: Migraines without aura, PTSD, Bipolar 2  OBJECTIVE:   There were no vitals taken for this visit.  ***  ASSESSMENT/PLAN:   There are no diagnoses linked to this encounter. No follow-ups on file.  Salvadore Oxford, MD [facility]

## 2022-09-17 IMAGING — MG MM DIGITAL SCREENING BILAT W/ TOMO AND CAD
6 of 12 series · 6 of 36 positions shown · non-contrast
Comparison: None.

CLINICAL DATA: Screening.

EXAM:
DIGITAL SCREENING BILATERAL MAMMOGRAM WITH TOMOSYNTHESIS AND CAD
TECHNIQUE: Bilateral screening digital craniocaudal and mediolateral oblique
mammograms were obtained. Bilateral screening digital breast
tomosynthesis was performed. The images were evaluated with
computer-aided detection.

[L MLO synth-2D (1 of 2)]
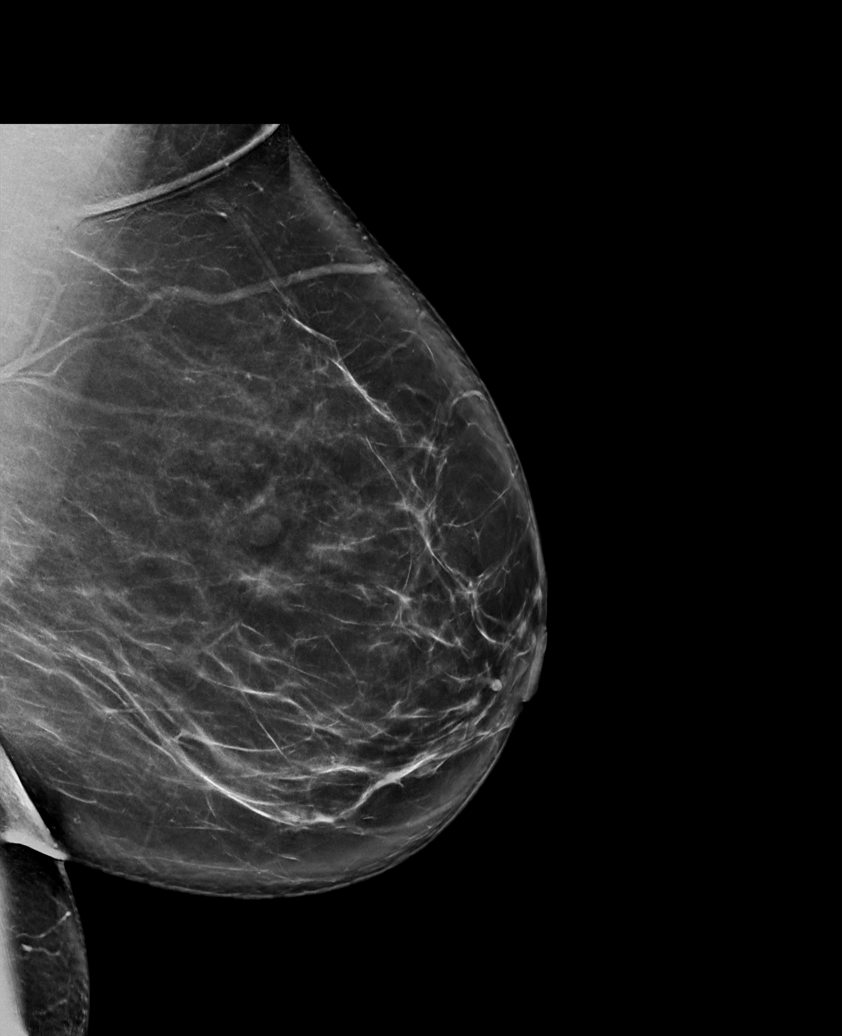

[R MLO synth-2D (1 of 2)]
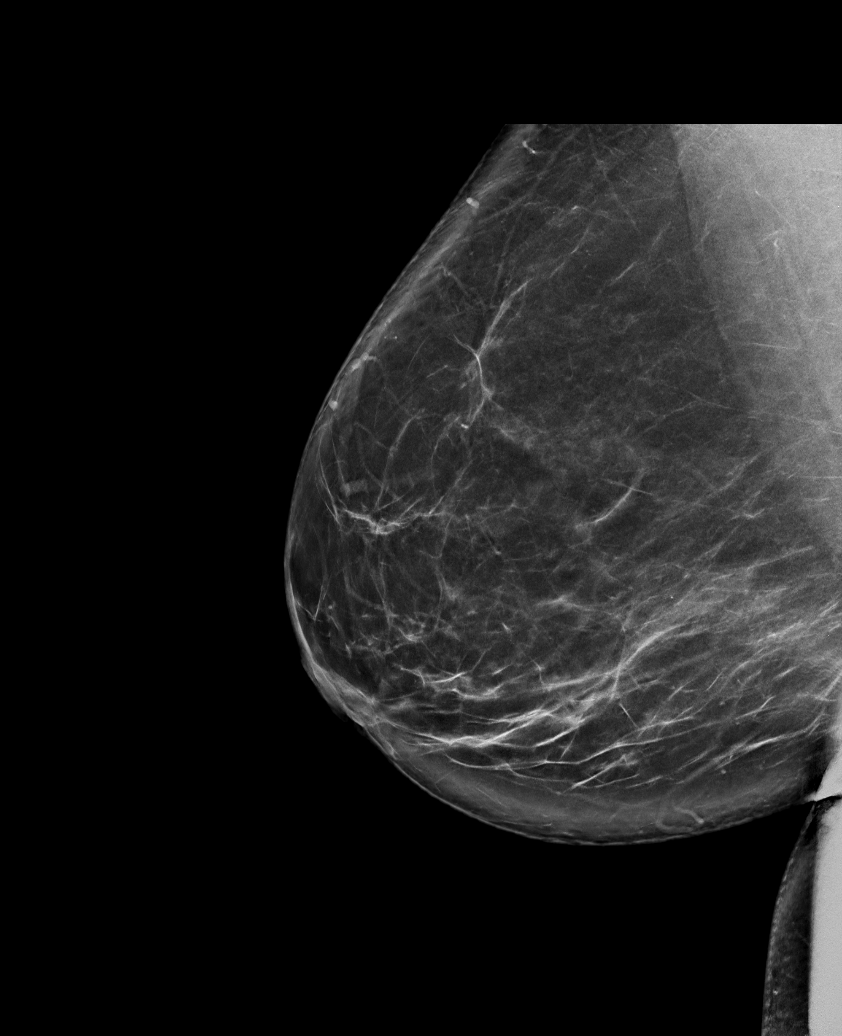

[R MLO synth-2D (2 of 2)]
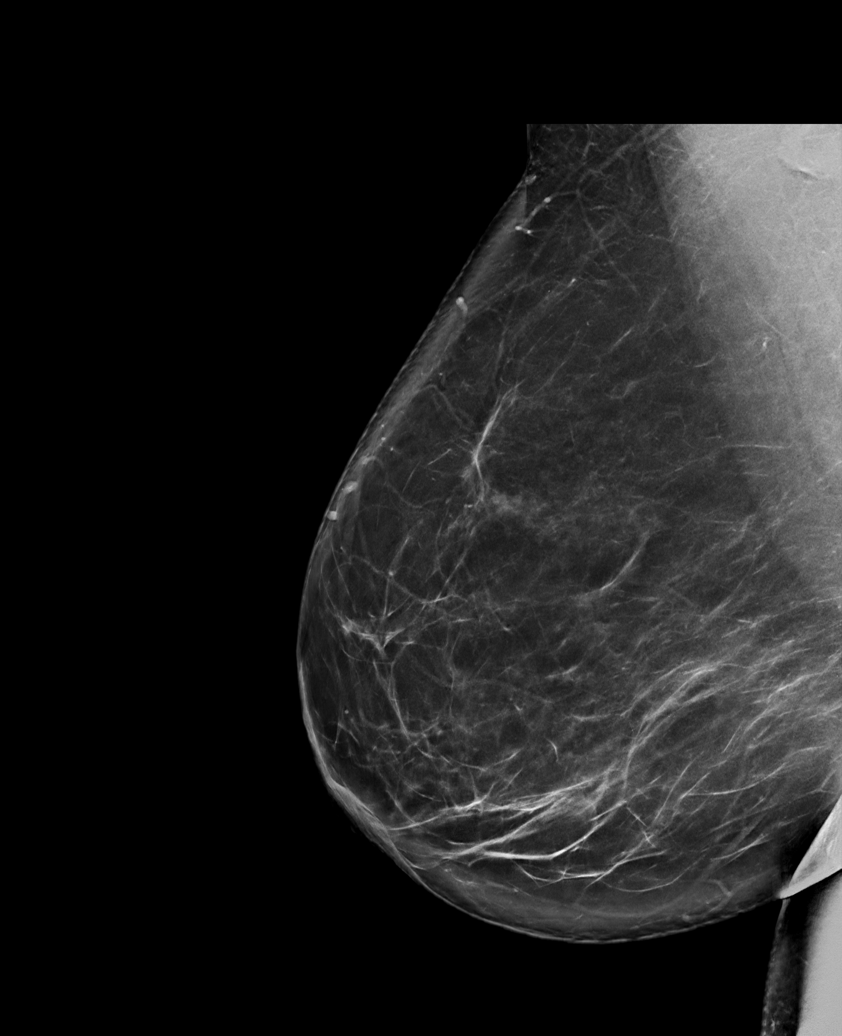

[L CC synth-2D]
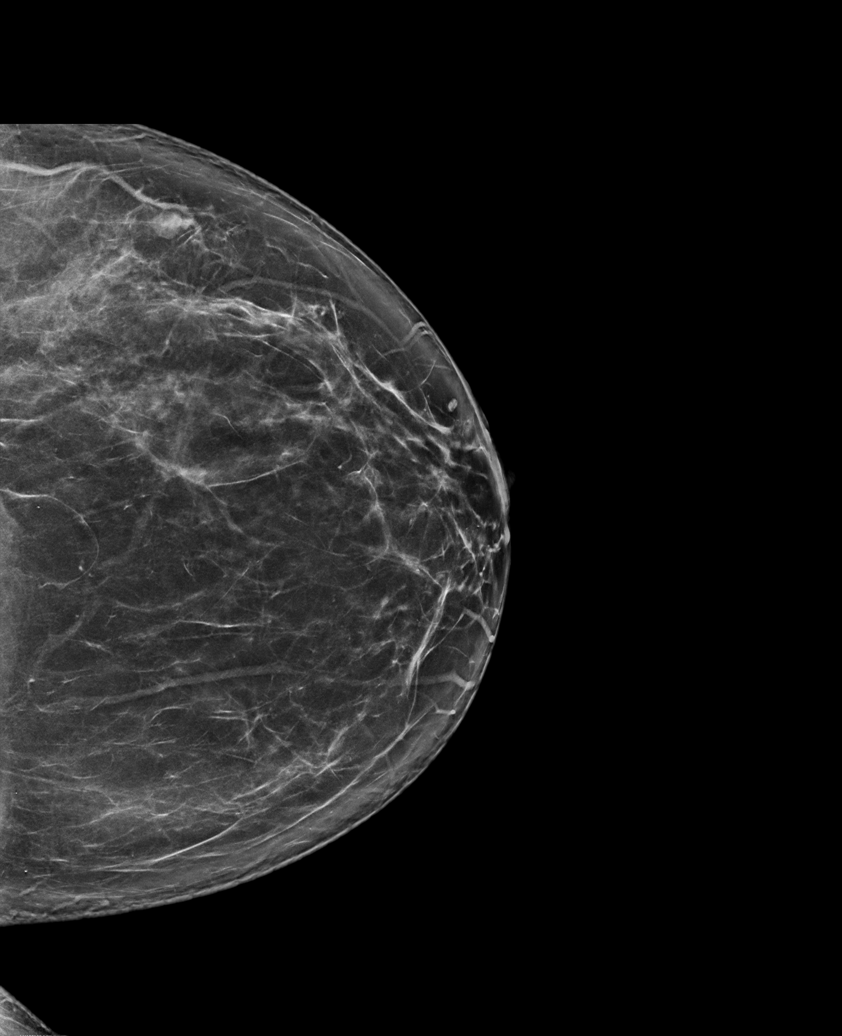

[R CC synth-2D]
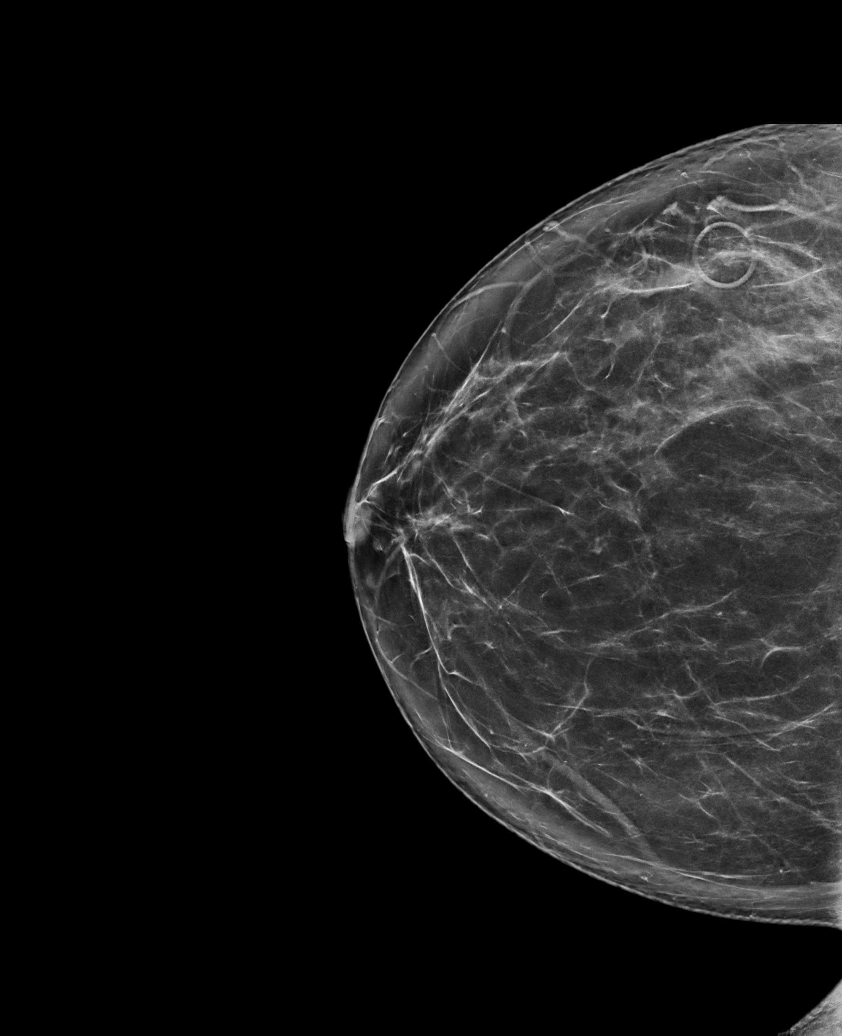

[L MLO synth-2D (2 of 2)]
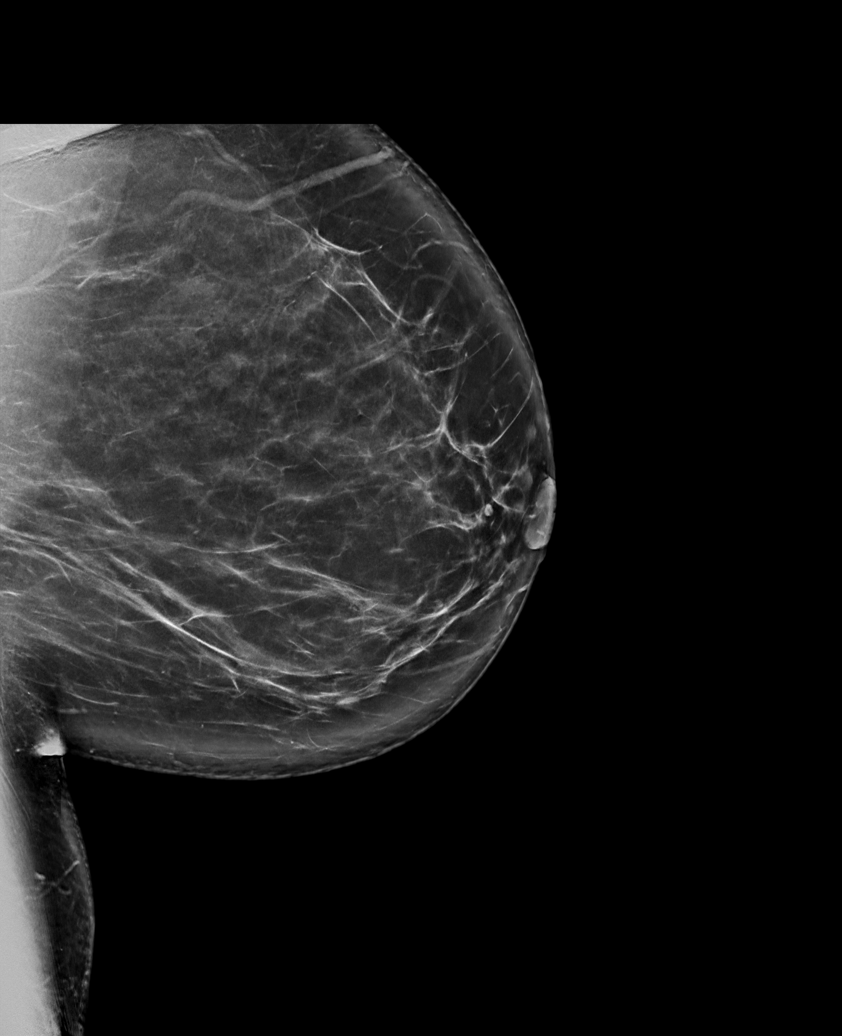

[6 of 36 positions shown; findings below may reference images not displayed]

ACR Breast Density Category b: There are scattered areas of
fibroglandular density.
FINDINGS: In the left breast, a possible mass warrants further evaluation. In
the right breast, no findings suspicious for malignancy.
IMPRESSION: Further evaluation is suggested for possible mass in the left
breast.

RECOMMENDATION:
Ultrasound of the left breast. (Code:[id number])

The patient will be contacted regarding the findings, and additional
imaging will be scheduled.

BI-RADS CATEGORY  0: Incomplete. Need additional imaging evaluation
and/or prior mammograms for comparison.

## 2022-12-12 ENCOUNTER — Ambulatory Visit (INDEPENDENT_AMBULATORY_CARE_PROVIDER_SITE_OTHER): Payer: Medicaid Other | Admitting: Student

## 2022-12-12 VITALS — BP 137/96 | HR 89 | Ht <= 58 in | Wt 274.1 lb

## 2022-12-12 DIAGNOSIS — S39012A Strain of muscle, fascia and tendon of lower back, initial encounter: Secondary | ICD-10-CM

## 2022-12-12 MED ORDER — METHOCARBAMOL 500 MG PO TABS: 500.0000 mg | ORAL_TABLET | Freq: Three times a day (TID) | ORAL | 0 refills | Status: DC | PRN

## 2022-12-12 MED ORDER — NAPROXEN 500 MG PO TABS: 500.0000 mg | ORAL_TABLET | Freq: Two times a day (BID) | ORAL | 0 refills | Status: AC

## 2022-12-12 NOTE — Patient Instructions (Signed)
It was great to see you! Thank you for allowing me to participate in your care!   Our plans for today:  -I am sending in naproxen to take twice a day for 5 days-please take this with a meal -Sending in Robaxin to take every 8 hours as needed for muscle spasm -Please do gentle stretching and heat to this area -Let us know if this is not improving  Take care and seek immediate care sooner if you develop any concerns.  Levin Erp, MD

## 2022-12-12 NOTE — Progress Notes (Signed)
    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 DG Lumbar Spine 2015-mild degenerative facet joint changes L>R. Was in bed and turned a certain way 2 days ago. Turning certain ways makes the pain worse. No leg weakness No saddle anesthesia No radiation of pain down her leg No dysuria or pain when peeing Has been taking Tylenol arthritis 650 mg 2 a day and on alternate days takes ibuprofen 400-600 mg every 8 hours did not help a lot Tried to stretch it out and not helping Robaxin has helped in past  PERTINENT  PMH / PSH: Chronic lumbar pain  OBJECTIVE:   BP (!) 137/96   Pulse 89   Ht  (1.448 m)   Wt 274 lb 2 oz (124.3 kg)   LMP 11/28/2022   SpO2 98%   BMI 59.32 kg/m   General: Well appearing, NAD, awake, alert, responsive to questions Head: Normocephalic atraumatic Respiratory: chest rises symmetrically,  no increased work of breathing Back: No tenderness along spinal processes, or paraspinal musculature.  Some pain with extreme forward flexion and extension.  Seated slump negative.  Normal gait.  Good lower extremity strength.   ASSESSMENT/PLAN:   Lumbar strain, initial encounter Acute lumber muscular strain. No radiculopathy or sxs of cauda equina. Declines injection today. Will treat with anti-inflammatory and muscle relaxer - naproxen (NAPROSYN) 500 MG tablet; Take 1 tablet (500 mg total) by mouth 2 (two) times daily with a meal for 5 days.  Dispense: 10 tablet; Refill: 0 - methocarbamol (ROBAXIN) 500 MG tablet; Take 1 tablet (500 mg total) by mouth every 8 (eight) hours as needed for muscle spasms.  Dispense: 15 tablet; Refill: 0  -Gentle stretching -Heat to area -Return if not improved in a week  Levin Erp, MD [facility]

## 2023-04-10 ENCOUNTER — Encounter: Payer: Self-pay | Admitting: Student

## 2023-04-11 ENCOUNTER — Encounter: Payer: Self-pay | Admitting: Student

## 2023-04-11 MED ORDER — OLMESARTAN MEDOXOMIL 20 MG PO TABS: 20.0000 mg | ORAL_TABLET | Freq: Every day | ORAL | 0 refills | Status: DC

## 2023-05-16 ENCOUNTER — Ambulatory Visit: Payer: Medicaid Other | Admitting: Student

## 2023-05-16 ENCOUNTER — Encounter: Payer: Self-pay | Admitting: Student

## 2023-05-16 VITALS — BP 152/106 | HR 82 | Ht 69.0 in | Wt 272.6 lb

## 2023-05-16 DIAGNOSIS — I10 Essential (primary) hypertension: Secondary | ICD-10-CM

## 2023-05-16 DIAGNOSIS — Z23 Encounter for immunization: Secondary | ICD-10-CM | POA: Diagnosis not present

## 2023-05-16 DIAGNOSIS — R0681 Apnea, not elsewhere classified: Secondary | ICD-10-CM | POA: Diagnosis not present

## 2023-05-16 MED ORDER — EPINEPHRINE 0.3 MG/0.3ML IJ SOAJ: 0.3000 mg | INTRAMUSCULAR | 1 refills | Status: AC | PRN

## 2023-05-16 MED ORDER — OLMESARTAN MEDOXOMIL-HCTZ 20-12.5 MG PO TABS: 1.0000 | ORAL_TABLET | Freq: Every day | ORAL | 0 refills | Status: DC

## 2023-05-16 NOTE — Patient Instructions (Addendum)
It was great to see you! Thank you for allowing me to participate in your care!   I recommend that you always bring your medications to each appointment as this makes it easy to ensure we are on the correct medications and helps [place] not miss when refills are needed.  Our plans for today:  - We will change your blood pressure medicine to olmesartan 20 - hydrochlorothiazide 12.5 mg daily - Please schedule a follow up for 2 weeks to recheck your blood pressure and labs (potassium and kidney function)  We are checking some labs today, I will call you if they are abnormal will send you a MyChart message or a letter if they are normal.  If you do not hear about your labs in the next 2 weeks please let us know.  Take care and seek immediate care sooner if you develop any concerns. Please remember to show up 15 minutes before your scheduled appointment time!  Levin Erp, MD [facility]

## 2023-05-16 NOTE — Assessment & Plan Note (Signed)
Patient's blood pressure is not controlled today. BP: (!) 152/106. Goal of 130/80. Patient's medication regimen includes benicar 20 mg. Discussed option of going up on benicar versus adding agent-opted for adding agent. -Changes to current regimen include changing to olmesartan 20mg -hydrochlorothiazide 12.5 mg -Labs: BMP -Follow up in 2 weeks for BP recheck and repeat BMP

## 2023-05-16 NOTE — Progress Notes (Signed)
    SUBJECTIVE:   CHIEF COMPLAINT / HPI: Blood pressure recheck  Hypertension: Patient is a 47 y.o. female who present today for follow up of hypertension.   Patient endorses difficulties with weight control-gained 50 pounds per patient over past year  Home medications include: benicar 20 mg daily Patient endorses taking these medications as prescribed.  Most recent creatinine trend:  Lab Results  Component Value Date   CREATININE 0.66 01/14/2022   CREATININE 0.71 03/03/2021   CREATININE 0.79 02/02/2021   Patient has not had a BMP in the past 1 year.  PERTINENT  PMH / PSH: migraine, obesity  OBJECTIVE:   BP (!) 152/106   Pulse 82   Ht 5\' 9"  (1.753 m)   Wt 272 lb 9.6 oz (123.7 kg)   LMP 04/23/2023   SpO2 100%   BMI 40.26 kg/m   General: NAD, awake, alert, responsive to questions Head: Normocephalic atraumatic Respiratory: chest rises symmetrically,  no increased work of breathing  ASSESSMENT/PLAN:   Essential hypertension, benign Patient's blood pressure is not controlled today. BP: (!) 152/106. Goal of 130/80. Patient's medication regimen includes benicar 20 mg. Discussed option of going up on benicar versus adding agent-opted for adding agent. -Changes to current regimen include changing to olmesartan 20mg -hydrochlorothiazide 12.5 mg -Labs: BMP -Follow up in 2 weeks for BP recheck and repeat BMP   Levin Erp, MD [facility]

## 2023-05-17 ENCOUNTER — Encounter: Payer: Self-pay | Admitting: Student

## 2023-05-17 DIAGNOSIS — R0681 Apnea, not elsewhere classified: Secondary | ICD-10-CM

## 2023-05-17 LAB — BASIC METABOLIC PANEL
BUN/Creatinine Ratio: 14 (ref 9–23)
BUN: 10 mg/dL (ref 6–24)
CO2: 20 mmol/L (ref 20–29)
Calcium: 9.6 mg/dL (ref 8.7–10.2)
Chloride: 102 mmol/L (ref 96–106)
Creatinine, Ser: 0.72 mg/dL (ref 0.57–1.00)
Glucose: 89 mg/dL (ref 70–99)
Potassium: 4.5 mmol/L (ref 3.5–5.2)
Sodium: 138 mmol/L (ref 134–144)
eGFR: 104 mL/min/{1.73_m2} (ref >59)

## 2023-05-23 NOTE — Addendum Note (Signed)
Addended by: Levin Erp on: 05/23/2023 03:37 PM   Modules accepted: Orders

## 2023-06-02 ENCOUNTER — Other Ambulatory Visit: Payer: Self-pay | Admitting: Student

## 2023-06-02 DIAGNOSIS — I10 Essential (primary) hypertension: Secondary | ICD-10-CM

## 2023-06-05 ENCOUNTER — Encounter: Payer: Self-pay | Admitting: Student

## 2023-06-05 ENCOUNTER — Ambulatory Visit: Payer: Medicaid Other | Admitting: Student

## 2023-06-05 ENCOUNTER — Other Ambulatory Visit: Payer: Self-pay

## 2023-06-05 VITALS — BP 128/83 | HR 85 | Ht 69.0 in | Wt 272.8 lb

## 2023-06-05 DIAGNOSIS — I10 Essential (primary) hypertension: Secondary | ICD-10-CM | POA: Diagnosis present

## 2023-06-05 DIAGNOSIS — Z72820 Sleep deprivation: Secondary | ICD-10-CM | POA: Diagnosis not present

## 2023-06-05 NOTE — Assessment & Plan Note (Addendum)
Patient notes history of poor sleep for years.  Patient reports that she gets around 4 hours of sleep at night.  Patient says she has difficult time falling asleep, until the early a.m.  Patient also notes she sleeps with TV on, and is due to have a sleep apnea study that she needs to schedule.  Will have patient schedule sleep apnea study, and follow-up for sleep hygiene/sleep evaluation, if she does not have sleep apnea.  Patient does appear to have poor sleep hygiene, will discuss further with patient in the future.  Discussed sleeping without TV, and mindfulness meditation. - Patient to schedule sleep study - Patient to follow-up as needed

## 2023-06-05 NOTE — Progress Notes (Signed)
  SUBJECTIVE:   CHIEF COMPLAINT / HPI:   HTN Meds: olmesartan 20mg -hydrochlorothiazide 12.5 mg  Taking meds and doing diet change. Feeling good about meds now.    Sleep Has poor sleep, and get's ~ 4 hours of sleep a night, for years. Sleeps with TV on. Has plans to get a sleep study scheduled. Note's she has constant flow of thoughts when trying to sleep, and is very busy minded/easily distracted during the day.   PERTINENT  PMH / PSH:    OBJECTIVE:  BP 128/83   Pulse 85   Ht 5\' 9"  (1.753 m)   Wt 272 lb 12.8 oz (123.7 kg)   LMP 04/23/2023   SpO2 100%   BMI 40.29 kg/m  Physical Exam Constitutional:      Appearance: Normal appearance.  Cardiovascular:     Rate and Rhythm: Normal rate and regular rhythm.     Pulses: Normal pulses.     Heart sounds: Normal heart sounds. No murmur heard.    No friction rub. No gallop.  Pulmonary:     Effort: Pulmonary effort is normal. No respiratory distress.     Breath sounds: Normal breath sounds. No stridor. No wheezing, rhonchi or rales.  Abdominal:     General: There is no distension.     Palpations: Abdomen is soft. There is no mass.     Tenderness: There is no abdominal tenderness. There is no guarding.  Skin:    Capillary Refill: Capillary refill takes less than 2 seconds.  Neurological:     Mental Status: She is alert.  Psychiatric:        Mood and Affect: Mood normal.        Behavior: Behavior normal.      ASSESSMENT/PLAN:  Essential hypertension, benign Assessment & Plan: Patient comes in for follow-up of her hypertension.  Patient reports good compliance with medication, also notes that she has made some diet changes.  Blood pressure at goal today.  Congratulated patient.  Patient feels good about taking medication. - Continue olmesartan 20/HCTZ 12.5   Poor sleep Assessment & Plan: Patient notes history of poor sleep for years.  Patient reports that she gets around 4 hours of sleep at night.  Patient says she has  difficult time falling asleep, until the early a.m.  Patient also notes she sleeps with TV on, and is due to have a sleep apnea study that she needs to schedule.  Will have patient schedule sleep apnea study, and follow-up for sleep hygiene/sleep evaluation, if she does not have sleep apnea.  Patient does appear to have poor sleep hygiene, will discuss further with patient in the future.  Discussed sleeping without TV, and mindfulness meditation. - Patient to schedule sleep study - Patient to follow-up as needed    No follow-ups on file. Bess Kinds, MD 06/05/2023, 2:56 PM PGY-3, [facility]

## 2023-06-05 NOTE — Patient Instructions (Addendum)
It was great to see you! Thank you for allowing me to participate in your care!  I recommend that you always bring your medications to each appointment as this makes it easy to ensure we are on the correct medications and helps [place] not miss when refills are needed.  Our plans for today:  - Blood Pressure  Your blood pressure looks good. We will continue the same meds you are on!  Great job! - Sleep Schedule your sleep study, turn off TV 2 hours before bed (turn on relaxing/soothing music).  IF sleep study does not show cause of your poor sleep, follow up with me in clinic. Mindfulness Mediation This is real and has been shown to help people! Try some meditation to learn how to work with the stream of thoughts you have at bedtime.   **Try Headspace app or go on youtube and look for "Guided Mindful Meditations"   Take care and seek immediate care sooner if you develop any concerns.   Dr. Bess Kinds, MD [facility]

## 2023-06-05 NOTE — Assessment & Plan Note (Signed)
Patient comes in for follow-up of her hypertension.  Patient reports good compliance with medication, also notes that she has made some diet changes.  Blood pressure at goal today.  Congratulated patient.  Patient feels good about taking medication. - Continue olmesartan 20/HCTZ 12.5

## 2023-06-18 ENCOUNTER — Ambulatory Visit (HOSPITAL_BASED_OUTPATIENT_CLINIC_OR_DEPARTMENT_OTHER): Payer: Medicaid Other | Attending: Family Medicine | Admitting: Internal Medicine

## 2023-06-18 DIAGNOSIS — R0681 Apnea, not elsewhere classified: Secondary | ICD-10-CM | POA: Diagnosis present

## 2023-06-25 DIAGNOSIS — R0681 Apnea, not elsewhere classified: Secondary | ICD-10-CM | POA: Diagnosis not present

## 2023-06-25 NOTE — Procedures (Signed)
    Patient Name: [name], [name] Date: 06/18/2023 Gender: Female D.O.B: 1976-03-11 Age (years): 60 Referring Provider: Levin Erp MD Height (inches): 69 Interpreting Physician: Jetty Duhamel MD, ABSM Weight (lbs): 264 RPSGT: Cherylann Parr BMI: 39 MRN: [id number] Neck Size: 15.00  CLINICAL INFORMATION Sleep Study Type: NPSG Indication for sleep study: Fatigue, Snoring, Witnesses Apnea / Gasping During Sleep Epworth Sleepiness Score: 2  SLEEP STUDY TECHNIQUE As per the AASM Manual for the Scoring of Sleep and Associated Events v2.3 (April 2016) with a hypopnea requiring 4% desaturations.  The channels recorded and monitored were frontal, central and occipital EEG, electrooculogram (EOG), submentalis EMG (chin), nasal and oral airflow, thoracic and abdominal wall motion, anterior tibialis EMG, snore microphone, electrocardiogram, and pulse oximetry.  MEDICATIONS Medications self-administered by patient taken the night of the study : none reported  SLEEP ARCHITECTURE The study was initiated at 10:23:48 PM and ended at 4:28:48 AM.  Sleep onset time was 46.7 minutes and the sleep efficiency was 58.4%. The total sleep time was 213 minutes.  Stage REM latency was 130.0 minutes.  The patient spent 5.6% of the night in stage N1 sleep, 68.8% in stage N2 sleep, 0.0% in stage N3 and 25.6% in REM.  Alpha intrusion was absent.  Supine sleep was 0.00%.  RESPIRATORY PARAMETERS The overall apnea/hypopnea index (AHI) was 9.9 per hour. There were 27 total apneas, including 27 obstructive, 0 central and 0 mixed apneas. There were 8 hypopneas and 0 RERAs.  The AHI during Stage REM sleep was 30.8 per hour.  AHI while supine was N/A per hour.  The mean oxygen saturation was 94.0%. The minimum SpO2 during sleep was 78.0%.  loud snoring was noted during this study.  CARDIAC DATA The 2 lead EKG demonstrated sinus rhythm. The mean heart rate was 70.0 beats per minute. Other EKG  findings include: None.  LEG MOVEMENT DATA The total PLMS were 0 with a resulting PLMS index of 0.0. Associated arousal with leg movement index was 0.0 .  IMPRESSIONS - Mild obstructive sleep apnea occurred during this study (AHI = 9.9/h). - Moderate oxygen desaturation was noted during this study (Min O2 = 78.0%, Mean 94%). - The patient snored with loud snoring volume. - No cardiac abnormalities were noted during this study. - Clinically significant periodic limb movements did not occur during sleep. No significant associated arousals.  DIAGNOSIS - Obstructive Sleep Apnea (G47.33)  RECOMMENDATIONS - Suggest CPAP titration sleep study or autopap. Other options would be based on clinical judgment. - Patient had difficulty initiating and maintaining sleep until after 12:00 MN, and gave history indicating insomnia at home. Treatment for insomnia may be considered.. - Sleep hygiene should be reviewed to assess factors that may improve sleep quality. - Weight management and regular exercise should be initiated or continued if appropriate.  [Electronically signed] 06/25/2023 12:27 PM  Jetty Duhamel MD, ABSM Diplomate, American Board of Sleep Medicine NPI: [id number]                         Jetty Duhamel Diplomate, American Board of Sleep Medicine  ELECTRONICALLY SIGNED ON:  06/25/2023, 12:23 PM [facility] PH: [phone]   FX: [phone] ACCREDITED BY THE AMERICAN ACADEMY OF SLEEP MEDICINE

## 2023-07-09 ENCOUNTER — Other Ambulatory Visit: Payer: Self-pay | Admitting: Student

## 2023-07-09 DIAGNOSIS — I10 Essential (primary) hypertension: Secondary | ICD-10-CM

## 2023-07-25 ENCOUNTER — Encounter: Payer: Self-pay | Admitting: Student

## 2023-07-25 NOTE — Telephone Encounter (Signed)
Please call patient and schedule her for visit to be seen this week for headaches that are changing.

## 2023-07-28 ENCOUNTER — Encounter: Payer: Self-pay | Admitting: Family Medicine

## 2023-07-28 ENCOUNTER — Ambulatory Visit (INDEPENDENT_AMBULATORY_CARE_PROVIDER_SITE_OTHER): Payer: Medicaid Other | Admitting: Family Medicine

## 2023-07-28 ENCOUNTER — Other Ambulatory Visit: Payer: Self-pay | Admitting: Family Medicine

## 2023-07-28 VITALS — BP 142/80 | HR 81 | Ht 69.0 in | Wt 273.2 lb

## 2023-07-28 DIAGNOSIS — I10 Essential (primary) hypertension: Secondary | ICD-10-CM

## 2023-07-28 DIAGNOSIS — J309 Allergic rhinitis, unspecified: Secondary | ICD-10-CM | POA: Insufficient documentation

## 2023-07-28 DIAGNOSIS — G43009 Migraine without aura, not intractable, without status migrainosus: Secondary | ICD-10-CM | POA: Diagnosis not present

## 2023-07-28 DIAGNOSIS — J301 Allergic rhinitis due to pollen: Secondary | ICD-10-CM | POA: Diagnosis not present

## 2023-07-28 MED ORDER — MONTELUKAST SODIUM 10 MG PO TABS: 10.0000 mg | ORAL_TABLET | Freq: Every day | ORAL | 0 refills | Status: DC

## 2023-07-28 MED ORDER — FLUTICASONE PROPIONATE 50 MCG/ACT NA SUSP: 2.0000 | Freq: Every day | NASAL | 2 refills | Status: DC

## 2023-07-28 MED ORDER — SUMATRIPTAN SUCCINATE 25 MG PO TABS: ORAL_TABLET | ORAL | 0 refills | Status: DC

## 2023-07-28 NOTE — Assessment & Plan Note (Signed)
Off meds today Advised to take meds as soon as she gets home F/U soon with PCP

## 2023-07-28 NOTE — Progress Notes (Signed)
    SUBJECTIVE:   CHIEF COMPLAINT / HPI:   Headache  This is a recurrent (Hx of Migraine headache, very bad symptoms when she was younger. Symptoms improved and now it is bad with vengence) problem. Episode onset: Here lately, she has been having headache more often, which she attributed to lack of rest. Episode frequency: Migraine can come every 6 months, but now more often than not. Progression since onset: Her last migraine episode was 8 month, but her last regular headache was 1-2 months ago. Pain location: Pain is usually unilateral, frontal. The quality of the pain is described as throbbing. The pain is at a severity of 0/10 (Currently no headache). Associated symptoms comments: Sometimes, she would have nausea and vomiting with her headache. Treatments tried: Used Imitrex in the past but is now out and need a refill. Imitrex works well for her. NSAID does not help. The treatment provided significant (Imitrex) relief. Her past medical history is significant for migraine headaches.   HTN: Did not take her BP meds last night, she forgot. She takes Benicar HCT 20/12.5 every day. She feels well otherwise.  Allergic rhinitis: Need med refill  PERTINENT  PMH / PSH: PMHx reviewed  OBJECTIVE:   BP (!) 142/80   Pulse 81   Ht 5\' 9"  (1.753 m)   Wt 273 lb 3.2 oz (123.9 kg)   LMP 07/19/2023   SpO2 99%   BMI 40.34 kg/m   Physical Exam Vitals and nursing note reviewed.  HENT:     Head: Normocephalic and atraumatic.  Cardiovascular:     Rate and Rhythm: Normal rate and regular rhythm.     Heart sounds: Normal heart sounds.  Pulmonary:     Effort: Pulmonary effort is normal. No respiratory distress.     Breath sounds: Normal breath sounds. No wheezing.  Neurological:     General: No focal deficit present.     Cranial Nerves: Cranial nerves 2-12 are intact.     Sensory: Sensation is intact.     Motor: Motor function is intact.     Gait: Gait is intact.      ASSESSMENT/PLAN:    Migraine without aura Currently asymptomatic May use Magnesium oxide 400 mg prn HA I refilled her Imitrex F/U with PCP as needed  Essential hypertension, benign Off meds today Advised to take meds as soon as she gets home F/U soon with PCP  Allergic rhinitis Flonase and Singulair escribed    Declined flu shot  Janit Pagan, MD [facility]

## 2023-07-28 NOTE — Patient Instructions (Signed)
Migraine Headache A migraine headache is a very strong throbbing pain on one or both sides of your head. This type of headache can also cause other symptoms. It can last from 4 hours to 3 days. Talk with your doctor about what things may bring on (trigger) this condition. What are the causes? The exact cause of a migraine is not known. This condition may be brought on or caused by: Smoking. Medicines, such as: Medicine used to treat chest pain (nitroglycerin). Birth control pills. Estrogen. Some blood pressure medicines. Certain substances in some foods or drinks. Foods and drinks, such as: Cheese. Chocolate. Alcohol. Caffeine. Doing physical activity that is very hard. Other things that may trigger a migraine headache include: Periods. Pregnancy. Hunger. Stress. Getting too much or too little sleep. Weather changes. Feeling tired (fatigue). What increases the risk? Being 25-55 years old. Being female. Having a family history of migraine headaches. Being Caucasian. Having a mental health condition, such as being sad (depressed) or feeling worried or nervous (anxious). Being very overweight (obese). What are the signs or symptoms? A throbbing pain. This pain may: Happen in any area of the head, such as on one or both sides. Make it hard to do daily activities. Get worse with physical activity. Get worse around bright lights, loud noises, or smells. Other symptoms may include: Feeling like you may vomit (nauseous). Vomiting. Dizziness. Before a migraine headache starts, you may get warning signs (an aura). An aura may include: Seeing flashing lights or having blind spots. Seeing bright spots, halos, or zigzag lines. Having tunnel vision or blurred vision. Having numbness or a tingling feeling. Having trouble talking. Having weak muscles. After a migraine ends, you may have symptoms. These may include: Tiredness. Trouble thinking (concentrating). How is this  treated? Taking medicines that: Relieve pain. Relieve the feeling like you may vomit. Prevent migraine headaches. Treatment may also include: Acupuncture. Lifestyle changes like avoiding foods that bring on migraine headaches. Learning ways to control your body functions (biofeedback). Therapy to help you know and deal with negative thoughts (cognitive behavioral therapy). Follow these instructions at home: Medicines Take over-the-counter and prescription medicines only as told by your doctor. If told, take steps to prevent problems with pooping (constipation). You may need to: Drink enough fluid to keep your pee (urine) pale yellow. Take medicines. You will be told what medicines to take. Eat foods that are high in fiber. These include beans, whole grains, and fresh fruits and vegetables. Limit foods that are high in fat and sugar. These include fried or sweet foods. Ask your doctor if you should avoid driving or using machines while you are taking your medicine. Lifestyle  Do not drink alcohol. Do not smoke or use any products that contain nicotine or tobacco. If you need help quitting, ask your doctor. Get 7-9 hours of sleep each night, or the amount recommended by your doctor. Find ways to deal with stress, such as meditation, deep breathing, or yoga. Try to exercise often. This can help lessen how bad and how often your migraines happen. General instructions Keep a journal to find out what may bring on your migraine headaches. This can help you avoid those things. For example, write down: What you eat and drink. How much sleep you get. Any change to your medicines or diet. If you have a migraine headache: Avoid things that make your symptoms worse, such as bright lights. Lie down in a dark, quiet room. Do not drive or use machinery. Ask your   doctor what activities are safe for you. Where to find more information Coalition for Headache and Migraine Patients (CHAMP):  headachemigraine.org American Migraine Foundation: americanmigrainefoundation.org National Headache Foundation: headaches.org Contact a doctor if: You get a migraine headache that is different or worse than others you have had. You have more than 15 days of headaches in one month. Get help right away if: Your migraine headache gets very bad. Your migraine headache lasts more than 72 hours. You have a fever or stiff neck. You have trouble seeing. Your muscles feel weak or like you cannot control them. You lose your balance a lot. You have trouble walking. You faint. You have a seizure. This information is not intended to replace advice given to you by your health care provider. Make sure you discuss any questions you have with your health care provider. Document Revised: 04/04/2022 Document Reviewed: 04/04/2022 Elsevier Patient Education  2024 Elsevier Inc.  

## 2023-07-28 NOTE — Assessment & Plan Note (Signed)
Flonase and Singulair escribed

## 2023-07-28 NOTE — Assessment & Plan Note (Signed)
Currently asymptomatic May use Magnesium oxide 400 mg prn HA I refilled her Imitrex F/U with PCP as needed

## 2023-07-29 ENCOUNTER — Encounter: Payer: Self-pay | Admitting: Family Medicine

## 2023-07-29 ENCOUNTER — Telehealth: Payer: Self-pay | Admitting: Family Medicine

## 2023-07-29 LAB — LIPID PANEL
Chol/HDL Ratio: 4.7 {ratio} — ABNORMAL HIGH (ref 0.0–4.4)
Cholesterol, Total: 161 mg/dL (ref 100–199)
HDL: 34 mg/dL — ABNORMAL LOW (ref >39)
LDL Chol Calc (NIH): 105 mg/dL — ABNORMAL HIGH (ref 0–99)
Triglycerides: 122 mg/dL (ref 0–149)
VLDL Cholesterol Cal: 22 mg/dL (ref 5–40)

## 2023-07-29 NOTE — Telephone Encounter (Signed)
HIPAA compliant callback message left.  Note: LDL mildly increased to 105 Goal < 100 10 year ASCVD risk of 1.1% Continue current dose of Crestor 20 mg every day with lifestyle modification Repeat lab in 12 months.

## 2023-07-31 ENCOUNTER — Other Ambulatory Visit: Payer: Self-pay | Admitting: Family Medicine

## 2023-07-31 MED ORDER — ROSUVASTATIN CALCIUM 20 MG PO TABS: 20.0000 mg | ORAL_TABLET | Freq: Every day | ORAL | 1 refills | Status: DC

## 2023-08-03 ENCOUNTER — Encounter: Payer: Self-pay | Admitting: Student

## 2023-09-18 ENCOUNTER — Ambulatory Visit (INDEPENDENT_AMBULATORY_CARE_PROVIDER_SITE_OTHER): Payer: Medicaid Other | Admitting: Student

## 2023-09-18 ENCOUNTER — Encounter: Payer: Self-pay | Admitting: Student

## 2023-09-18 VITALS — BP 149/87 | HR 97 | Ht 69.0 in | Wt 268.8 lb

## 2023-09-18 DIAGNOSIS — Z1239 Encounter for other screening for malignant neoplasm of breast: Secondary | ICD-10-CM | POA: Diagnosis not present

## 2023-09-18 DIAGNOSIS — I10 Essential (primary) hypertension: Secondary | ICD-10-CM

## 2023-09-18 DIAGNOSIS — E785 Hyperlipidemia, unspecified: Secondary | ICD-10-CM | POA: Diagnosis not present

## 2023-09-18 DIAGNOSIS — E669 Obesity, unspecified: Secondary | ICD-10-CM | POA: Diagnosis not present

## 2023-09-18 MED ORDER — ROSUVASTATIN CALCIUM 10 MG PO TABS: 10.0000 mg | ORAL_TABLET | Freq: Every day | ORAL | 3 refills | Status: AC

## 2023-09-18 MED ORDER — OLMESARTAN MEDOXOMIL-HCTZ 40-12.5 MG PO TABS: 1.0000 | ORAL_TABLET | Freq: Every day | ORAL | 3 refills | Status: AC

## 2023-09-18 NOTE — Progress Notes (Signed)
    SUBJECTIVE:   Chief compliant/HPI: annual examination  [name] is a 48 y.o. who presents today for an annual exam.   Needs physical exam form filled out for [employer].  Overall doing well with no major concerns or complaints. She exercises 20 minutes/week on her walking pad.  Has a history of tobacco use, 2 packs/day for 21 years. Denies alcohol use.  History tabs reviewed and updated .    OBJECTIVE:   BP (!) 149/87   Pulse 97   Ht 5\' 9"  (1.753 m)   Wt 268 lb 12.8 oz (121.9 kg)   SpO2 100%   BMI 39.69 kg/m   General: NAD, well appearing Cardiac: RRR Neuro: A&O Respiratory: normal WOB on RA. No wheezing or crackles on auscultation, good lung sounds throughout Extremities: Moving all 4 extremities equally Skin: Warm and dry  ASSESSMENT/PLAN:   Essential hypertension, benign Not at goal of less than 130/80. Appears that she has had blood pressures in the 140s/80s-100s on multiple visits on 12/6 and today. Increase antihypertensive from olmesartan 20 mg-HCTZ 12.5 mg to olmesartan 40 mg-HCTZ 12.5 mg  Follow-up with PCP in 2 weeks for Pap smear, and blood pressure recheck  Reports muscle cramps with rosuvastatin. Reduced dose to 10 mg daily.  Annual Examination  See AVS for recommendations.  PHQ score 10, reviewed. #9 negative. Blood pressure value is not at goal. BMI elevated 39.69, discussed increasing time spent on walking pad per week  Considered the following screening exams based upon USPSTF recommendations: Diabetes screening: ordered Screening for elevated cholesterol: ordered Colorectal cancer screening:  discussed  if age 57 or over or risk factors.  Immunizations .   Follow up in 2 weeks to 1 month     Darral Dash, DO [facility]

## 2023-09-18 NOTE — Patient Instructions (Addendum)
It was great seeing you today.  As we discussed, -I sent in the reduced dose of rosuvastatin, 10 mg daily. -Please call and get your mammogram scheduled -Schedule an appointment at the front desk with your primary care doctor to return for a Pap smear -Schedule a nurse visit to return for TB test -We are increasing your blood pressure medicine.  Please check your blood pressure couple times per week.  Return in 2 weeks for blood pressure check.  We collected blood work today.  I will call you if anything is abnormal.  If not, I will send you MyChart message.  If you have any questions or concerns, please feel free to call the clinic.   Have a wonderful day,  Dr. Darral Dash [facility] [phone]

## 2023-09-18 NOTE — Assessment & Plan Note (Signed)
Not at goal of less than 130/80. Appears that she has had blood pressures in the 140s/80s-100s on multiple visits on 12/6 and today. Increase antihypertensive from olmesartan 20 mg-HCTZ 12.5 mg to olmesartan 40 mg-HCTZ 12.5 mg  Follow-up with PCP in 2 weeks for Pap smear, and blood pressure recheck

## 2023-09-19 ENCOUNTER — Ambulatory Visit (INDEPENDENT_AMBULATORY_CARE_PROVIDER_SITE_OTHER): Payer: Medicaid Other

## 2023-09-19 DIAGNOSIS — Z111 Encounter for screening for respiratory tuberculosis: Secondary | ICD-10-CM

## 2023-09-19 LAB — BASIC METABOLIC PANEL
BUN/Creatinine Ratio: 12 (ref 9–23)
BUN: 9 mg/dL (ref 6–24)
CO2: 22 mmol/L (ref 20–29)
Calcium: 9.6 mg/dL (ref 8.7–10.2)
Chloride: 102 mmol/L (ref 96–106)
Creatinine, Ser: 0.77 mg/dL (ref 0.57–1.00)
Glucose: 110 mg/dL — ABNORMAL HIGH (ref 70–99)
Potassium: 4 mmol/L (ref 3.5–5.2)
Sodium: 139 mmol/L (ref 134–144)
eGFR: 96 mL/min/{1.73_m2} (ref >59)

## 2023-09-19 LAB — HEMOGLOBIN A1C
Est. average glucose Bld gHb Est-mCnc: 128 mg/dL
Hgb A1c MFr Bld: 6.1 % — ABNORMAL HIGH (ref 4.8–5.6)

## 2023-09-19 LAB — LIPID PANEL
Chol/HDL Ratio: 3.4 {ratio} (ref 0.0–4.4)
Cholesterol, Total: 133 mg/dL (ref 100–199)
HDL: 39 mg/dL — ABNORMAL LOW (ref >39)
LDL Chol Calc (NIH): 78 mg/dL (ref 0–99)
Triglycerides: 83 mg/dL (ref 0–149)
VLDL Cholesterol Cal: 16 mg/dL (ref 5–40)

## 2023-09-19 NOTE — Progress Notes (Signed)
Patient presents to nurse clinic for TB skin test.  PPD placed in left ventral forearm without complication.  Patient to return on 01/30 to have site read.

## 2023-09-21 ENCOUNTER — Ambulatory Visit: Payer: Medicaid Other

## 2023-09-21 DIAGNOSIS — Z111 Encounter for screening for respiratory tuberculosis: Secondary | ICD-10-CM

## 2023-09-21 LAB — TB SKIN TEST
Induration: 0 mm
TB Skin Test: NEGATIVE

## 2023-09-21 NOTE — Progress Notes (Signed)
PPD Reading Note PPD read and results entered in EpicCare. Result: 0 mm induration. Interpretation: Negative Allergic reaction: Yes- redness and itching  TB form signed off on and given to patient.

## 2023-09-22 ENCOUNTER — Encounter: Payer: Self-pay | Admitting: Student

## 2023-09-23 ENCOUNTER — Encounter: Payer: Self-pay | Admitting: Student

## 2023-09-26 ENCOUNTER — Ambulatory Visit: Payer: Self-pay | Admitting: Student

## 2023-09-26 NOTE — Patient Instructions (Incomplete)
It was great to see you! Thank you for allowing me to participate in your care!  I recommend that you always bring your medications to each appointment as this makes it easy to ensure we are on the correct medications and helps [place] not miss when refills are needed.  Our plans for today:  - Pap Smear -   We are checking some labs today, I will call you if they are abnormal will send you a MyChart message or a letter if they are normal.  If you do not hear about your labs in the next 2 weeks please let us know.***  Take care and seek immediate care sooner if you develop any concerns.   Dr. Bess Kinds, MD [facility]

## 2023-09-26 NOTE — Progress Notes (Deleted)
  SUBJECTIVE:   CHIEF COMPLAINT / HPI:   Pap Smear -Previous unsatisfactory in 2022 PERTINENT  PMH / PSH: ***  Past Medical History:  Diagnosis Date   Depression    Hypertension    Migraine    PTSD (post-traumatic stress disorder)    Vertigo    OBJECTIVE:  There were no vitals taken for this visit. Physical Exam   ASSESSMENT/PLAN:   Assessment & Plan  No follow-ups on file. Penne Rhein, MD 09/26/2023, 7:00 AM PGY-***, [facility] {    This will disappear when note is signed, click to select method of visit    :1}

## 2023-10-04 ENCOUNTER — Ambulatory Visit (INDEPENDENT_AMBULATORY_CARE_PROVIDER_SITE_OTHER): Payer: Medicaid Other | Admitting: Student

## 2023-10-04 ENCOUNTER — Encounter: Payer: Self-pay | Admitting: Student

## 2023-10-04 ENCOUNTER — Other Ambulatory Visit (HOSPITAL_COMMUNITY)
Admission: RE | Admit: 2023-10-04 | Discharge: 2023-10-04 | Disposition: A | Discharge status: Home / Self Care | Payer: Medicaid Other | Source: Ambulatory Visit | Attending: Family Medicine | Admitting: Family Medicine

## 2023-10-04 VITALS — BP 132/87 | HR 97 | Ht 69.0 in | Wt 266.6 lb

## 2023-10-04 DIAGNOSIS — Z124 Encounter for screening for malignant neoplasm of cervix: Secondary | ICD-10-CM | POA: Insufficient documentation

## 2023-10-04 DIAGNOSIS — N841 Polyp of cervix uteri: Secondary | ICD-10-CM | POA: Insufficient documentation

## 2023-10-04 NOTE — Assessment & Plan Note (Addendum)
Patient noted to have cervical polyp extending from cervix.  Will recommend patient follow-up in colposcopy clinic. - Colposcopy recommended

## 2023-10-04 NOTE — Assessment & Plan Note (Addendum)
Patient comes in for Pap smear.  Last Pap smear was unsatisfactory.  Patient's previous Pap smears have been normal, however patient reports that she has been told to come for colposcopy when she was younger.  Patient also noted to have cervical polyp extending from cervix.  Will recommend patient come back for colposcopy.  Pap smear collected. - Follow-up with colposcopy

## 2023-10-04 NOTE — Progress Notes (Signed)
  SUBJECTIVE:   CHIEF COMPLAINT / HPI:   Pap Smear -Pt due for papsmear, as last was unsatisfactory 6/22  PERTINENT  PMH / PSH:    OBJECTIVE:  BP 132/87   Pulse 97   Ht 5\' 9"  (1.753 m)   Wt 266 lb 9.6 oz (120.9 kg)   SpO2 98%   BMI 39.37 kg/m  Physical Exam Exam conducted with a chaperone present.  Genitourinary:    Labia:        Right: Lesion present.      Vagina: Vaginal discharge present. No erythema, tenderness, bleeding, lesions or prolapsed vaginal walls.     Cervix: Friability and lesion present. No cervical motion tenderness or discharge.     Comments: Cervical polyp noted extending from cervix, small sub centimeter healed boil located on right labia     ASSESSMENT/PLAN:   Assessment & Plan Pap smear for cervical cancer screening Patient comes in for Pap smear.  Last Pap smear was unsatisfactory.  Patient's previous Pap smears have been normal, however patient reports that she has been told to come for colposcopy when she was younger.  Patient also noted to have cervical polyp extending from cervix.  Will recommend patient come back for colposcopy.  Pap smear collected. - Follow-up with colposcopy Cervical polyp Patient noted to have cervical polyp extending from cervix.  Will recommend patient follow-up in colposcopy clinic. - Colposcopy recommended No follow-ups on file. Bess Kinds, MD 10/04/2023, 12:23 PM PGY-3, [facility]

## 2023-10-04 NOTE — Addendum Note (Signed)
Addended by: Bess Kinds T on: 10/04/2023 12:53 PM   Modules accepted: Orders

## 2023-10-04 NOTE — Patient Instructions (Signed)
It was great to see you! Thank you for allowing me to participate in your care!  I recommend that you always bring your medications to each appointment as this makes it easy to ensure we are on the correct medications and helps [place] not miss when refills are needed.  Our plans for today:  - Pap Smear  Today we performed a PAP Smear, but we do recommend you come back for colposcopy. Call the clinic and ask to make an appointment in "[facility]" as you have a polyp on your cervix.  - Due for Colonoscopy  Let us know when you would like to schedule.   Take care and seek immediate care sooner if you develop any concerns.   Dr. Bess Kinds, MD [facility]

## 2023-10-06 LAB — CYTOLOGY - PAP
Adequacy: ABSENT
Comment: NEGATIVE
Diagnosis: NEGATIVE
High risk HPV: NEGATIVE

## 2023-10-10 ENCOUNTER — Ambulatory Visit
Admission: RE | Admit: 2023-10-10 | Discharge: 2023-10-10 | Disposition: A | Discharge status: Home / Self Care | Payer: Medicaid Other | Source: Ambulatory Visit | Attending: Family Medicine | Admitting: Family Medicine

## 2023-10-10 DIAGNOSIS — Z1239 Encounter for other screening for malignant neoplasm of breast: Secondary | ICD-10-CM

## 2023-10-19 ENCOUNTER — Other Ambulatory Visit: Payer: Self-pay | Admitting: Student

## 2023-10-19 DIAGNOSIS — J301 Allergic rhinitis due to pollen: Secondary | ICD-10-CM

## 2023-10-23 ENCOUNTER — Other Ambulatory Visit: Payer: Self-pay | Admitting: Family Medicine

## 2023-10-23 DIAGNOSIS — J301 Allergic rhinitis due to pollen: Secondary | ICD-10-CM

## 2023-11-03 ENCOUNTER — Ambulatory Visit: Admitting: Student

## 2023-11-03 VITALS — BP 116/70 | HR 88 | Ht 69.0 in | Wt 263.4 lb

## 2023-11-03 DIAGNOSIS — J309 Allergic rhinitis, unspecified: Secondary | ICD-10-CM

## 2023-11-03 MED ORDER — AZELASTINE-FLUTICASONE 137-50 MCG/ACT NA SUSP
1.0000 | Freq: Two times a day (BID) | NASAL | 2 refills | Status: AC
Start: 2023-11-03 — End: ?

## 2023-11-03 NOTE — Assessment & Plan Note (Signed)
 Chronic severe allergic rhinitis exacerbated by spring allergens. Previous treatments provided limited relief. Recommended Dymista for better control. Discussed nasal spray technique and Sudafed use with caution due to hypertension risk. - Discontinue fluticasone. - Prescribe Dymista nasal spray, one spray per nostril twice daily, pending prior authorization. - Advise Sudafed for congestion as needed, with caution regarding hypertension. - Educated on nasal spray technique.

## 2023-11-03 NOTE — Patient Instructions (Signed)
 [name],  So great to meet you. You have terrible allergies. Let's swap out the Flonase for a prescription spray called Dymista. Your insurance is going to want [place] to do a prior auth here, so give [place] a few days for our pharmacy team to do this.  In the meantime, I think you're going to need some Sudafed for the nasal congestion.   Eliezer Mccoy, MD

## 2023-11-03 NOTE — Progress Notes (Signed)
    SUBJECTIVE:   CHIEF COMPLAINT / HPI:   [name] is a 48 year old female who presents with severe nasal congestion and allergy symptoms.  She experiences severe allergies, particularly at the onset of spring, and is affected by multiple allergens. Allergy testing has confirmed multiple sensitivities.  Her nasal congestion is significant and interferes with her sleep. She wakes up feeling congested and has a history of using nasal sprays for many years. Despite using proper technique, her symptoms persist.  She manages her symptoms with over-the-counter medications including cetirizine, Benadryl, Claritin, and Flonase, as well as saline nasal spray and tea for congestion. She has tried Sudafed in the past, which she finds effective but uses cautiously due to blood pressure concerns.  She has a history of blood pressure issues, but it is currently well-controlled.  No asthma or other respiratory conditions. Nasal congestion interferes with her sleep.    OBJECTIVE:   BP 116/70   Pulse 88   Ht 5\' 9"  (1.753 m)   Wt 263 lb 6.4 oz (119.5 kg)   SpO2 99%   BMI 38.90 kg/m   Gen: Well appearing and NAD  HENT: Conjunctivae are clear and without injection. Nasal turbinates are swollen, pale and boggy. Oropharynx is clear and without lesion.  Pulm: Normal WOB on RA, speaking in full sentences Skin: Without rash   ASSESSMENT/PLAN:   Assessment & Plan Allergic rhinitis, unspecified seasonality, unspecified trigger Chronic severe allergic rhinitis exacerbated by spring allergens. Previous treatments provided limited relief. Recommended Dymista for better control. Discussed nasal spray technique and Sudafed use with caution due to hypertension risk. - Discontinue fluticasone. - Prescribe Dymista nasal spray, one spray per nostril twice daily, pending prior authorization. - Advise Sudafed for congestion as needed, with caution regarding hypertension. - Educated on nasal spray  technique.   Eliezer Mccoy, MD [facility]

## 2024-01-26 ENCOUNTER — Other Ambulatory Visit: Payer: Self-pay | Admitting: Student

## 2024-01-26 DIAGNOSIS — J301 Allergic rhinitis due to pollen: Secondary | ICD-10-CM

## 2024-01-30 ENCOUNTER — Encounter: Payer: Self-pay | Admitting: *Deleted

## 2024-04-08 ENCOUNTER — Ambulatory Visit: Admitting: Family Medicine

## 2024-04-25 ENCOUNTER — Encounter: Payer: Self-pay | Admitting: Family Medicine

## 2024-04-25 ENCOUNTER — Ambulatory Visit: Admitting: Family Medicine

## 2024-04-25 VITALS — BP 138/80 | HR 95 | Wt 255.0 lb

## 2024-04-25 DIAGNOSIS — R5383 Other fatigue: Secondary | ICD-10-CM

## 2024-04-25 DIAGNOSIS — N841 Polyp of cervix uteri: Secondary | ICD-10-CM

## 2024-04-25 DIAGNOSIS — R7303 Prediabetes: Secondary | ICD-10-CM

## 2024-04-25 DIAGNOSIS — N921 Excessive and frequent menstruation with irregular cycle: Secondary | ICD-10-CM

## 2024-04-25 LAB — POCT GLYCOSYLATED HEMOGLOBIN (HGB A1C): Hemoglobin A1C: 5.9 % — AB (ref 4.0–5.6)

## 2024-04-25 NOTE — Progress Notes (Signed)
    SUBJECTIVE:   CHIEF COMPLAINT / HPI:   Vaginal bleeding  2 months - on and off heavy, heavy about 4 weeks  No pain  Not sexually active, no BC Tired Bones hurt   PERTINENT  PMH / PSH: ***  OBJECTIVE:   BP 138/80   Pulse 95   Wt 255 lb (115.7 kg)   SpO2 98%   BMI 37.66 kg/m   General: Well-appearing. Resting comfortably in room. CV: Normal S1/S2. No extra heart sounds. Warm and well-perfused. Pulm: Breathing comfortably on room air. CTAB. No increased WOB. Abd: Soft, non-tender, non-distended. Skin:  Warm, dry. Psych: Pleasant and appropriate.   GU: Cervical polyp   ASSESSMENT/PLAN:   Assessment & Plan Menorrhagia with irregular cycle  Other fatigue      Damien Cassis, MD [facility]

## 2024-04-25 NOTE — Patient Instructions (Signed)
 Thank you for visiting clinic today and allowing us  to participate in your care!  We are collecting labwork today and will let you know of the results.   We are referring you to OBGYN for further management.   Please call [phone] to schedule your transvaginal ultrasound.   Reach out any time with any questions or concerns you may have - we are here for you!  Damien Cassis, MD [facility] [phone]

## 2024-04-26 NOTE — Assessment & Plan Note (Signed)
 AUB of unknown etiology, possibly fibroid as noted on TVUS in 2021. Concern for anemia and fatigue 2/2 AUB. Per chart review, cervical polyp noted in Feb 2025, was recommended for colpo but was unable to make this appt.  - Discussed TVUS for further eval, ordered - OB/GYN referral placed - CBC, Ferritin, TSH ordered

## 2024-04-27 LAB — CBC WITH DIFFERENTIAL/PLATELET
Basophils Absolute: 0 x10E3/uL (ref 0.0–0.2)
Basos: 0 %
EOS (ABSOLUTE): 0 x10E3/uL (ref 0.0–0.4)
Eos: 0 %
Hematocrit: 37.5 % (ref 34.0–46.6)
Hemoglobin: 11.4 g/dL (ref 11.1–15.9)
Immature Grans (Abs): 0 x10E3/uL (ref 0.0–0.1)
Immature Granulocytes: 0 %
Lymphocytes Absolute: 3.4 x10E3/uL — ABNORMAL HIGH (ref 0.7–3.1)
Lymphs: 29 %
MCH: 22.3 pg — ABNORMAL LOW (ref 26.6–33.0)
MCHC: 30.4 g/dL — ABNORMAL LOW (ref 31.5–35.7)
MCV: 73 fL — ABNORMAL LOW (ref 79–97)
Monocytes Absolute: 1.2 x10E3/uL — ABNORMAL HIGH (ref 0.1–0.9)
Monocytes: 10 %
Neutrophils Absolute: 6.8 x10E3/uL (ref 1.4–7.0)
Neutrophils: 61 %
Platelets: 254 x10E3/uL (ref 150–450)
RBC: 5.12 x10E6/uL (ref 3.77–5.28)
RDW: 15.1 % (ref 11.7–15.4)
WBC: 11.5 x10E3/uL — ABNORMAL HIGH (ref 3.4–10.8)

## 2024-04-27 LAB — FERRITIN: Ferritin: 19 ng/mL (ref 15–150)

## 2024-04-27 LAB — TSH: TSH: 1.49 u[IU]/mL (ref 0.450–4.500)

## 2024-04-29 ENCOUNTER — Encounter: Payer: Self-pay | Admitting: Family Medicine

## 2024-04-29 ENCOUNTER — Ambulatory Visit: Payer: Self-pay | Admitting: Family Medicine

## 2024-04-29 DIAGNOSIS — D259 Leiomyoma of uterus, unspecified: Secondary | ICD-10-CM

## 2024-05-01 ENCOUNTER — Other Ambulatory Visit: Payer: Self-pay | Admitting: Family Medicine

## 2024-05-01 NOTE — Telephone Encounter (Signed)
 Chart reviewed. Rx refilled.

## 2024-05-02 ENCOUNTER — Ambulatory Visit (HOSPITAL_COMMUNITY)

## 2024-05-03 ENCOUNTER — Ambulatory Visit (HOSPITAL_COMMUNITY)

## 2024-05-17 ENCOUNTER — Ambulatory Visit (HOSPITAL_COMMUNITY)
Admission: RE | Admit: 2024-05-17 | Discharge: 2024-05-17 | Disposition: A | Discharge status: Home / Self Care | Source: Ambulatory Visit | Attending: Family Medicine | Admitting: Family Medicine

## 2024-05-17 DIAGNOSIS — N921 Excessive and frequent menstruation with irregular cycle: Secondary | ICD-10-CM | POA: Diagnosis present

## 2024-07-01 ENCOUNTER — Telehealth: Admitting: Physician Assistant

## 2024-07-01 DIAGNOSIS — J329 Chronic sinusitis, unspecified: Secondary | ICD-10-CM | POA: Diagnosis not present

## 2024-07-01 DIAGNOSIS — J40 Bronchitis, not specified as acute or chronic: Secondary | ICD-10-CM | POA: Diagnosis not present

## 2024-07-01 MED ORDER — PROMETHAZINE-DM 6.25-15 MG/5ML PO SYRP: 5.0000 mL | ORAL_SOLUTION | Freq: Four times a day (QID) | ORAL | 0 refills | Status: AC | PRN

## 2024-07-01 MED ORDER — PREDNISONE 20 MG PO TABS: 40.0000 mg | ORAL_TABLET | Freq: Every day | ORAL | 0 refills | Status: AC

## 2024-07-01 MED ORDER — ALBUTEROL SULFATE HFA 108 (90 BASE) MCG/ACT IN AERS: 1.0000 | INHALATION_SPRAY | Freq: Four times a day (QID) | RESPIRATORY_TRACT | 0 refills | Status: AC | PRN

## 2024-07-01 MED ORDER — AMOXICILLIN-POT CLAVULANATE 875-125 MG PO TABS: 1.0000 | ORAL_TABLET | Freq: Two times a day (BID) | ORAL | 0 refills | Status: AC

## 2024-07-01 NOTE — Progress Notes (Signed)
 Virtual Visit Consent   ALIANNAH Byrd, you are scheduled for a virtual visit with a [facility] provider today. Just as with appointments in the office, your consent must be obtained to participate. Your consent will be active for this visit and any virtual visit you may have with one of our providers in the next 365 days. If you have a MyChart account, a copy of this consent can be sent to you electronically.  As this is a virtual visit, video technology does not allow for your provider to perform a traditional examination. This may limit your provider's ability to fully assess your condition. If your provider identifies any concerns that need to be evaluated in person or the need to arrange testing (such as labs, EKG, etc.), we will make arrangements to do so. Although advances in technology are sophisticated, we cannot ensure that it will always work on either your end or our end. If the connection with a video visit is poor, the visit may have to be switched to a telephone visit. With either a video or telephone visit, we are not always able to ensure that we have a secure connection.  By engaging in this virtual visit, you consent to the provision of healthcare and authorize for your insurance to be billed (if applicable) for the services provided during this visit. Depending on your insurance coverage, you may receive a charge related to this service.  I need to obtain your verbal consent now. Are you willing to proceed with your visit today? MONI ROTHROCK has provided verbal consent on 07/01/2024 for a virtual visit (video or telephone). Delon CHRISTELLA Dickinson, PA-C  Date: 07/01/2024 10:16 AM   Virtual Visit via Video Note   I, Delon CHRISTELLA Dickinson, connected with  [name]  ([id number], [birth year]) on 07/01/24 at 10:00 AM EST by a video-enabled telemedicine application and verified that I am speaking with the correct person using two identifiers.  Location: Patient: Virtual  Visit Location Patient: Home Provider: Virtual Visit Location Provider: Home Office   I discussed the limitations of evaluation and management by telemedicine and the availability of in person appointments. The patient expressed understanding and agreed to proceed.    History of Present Illness: [name] is a 48 y.o. who identifies as a female who was assigned female at birth, and is being seen today for cough and congestion.  HPI: Cough This is a new problem. The current episode started 1 to 4 weeks ago. The problem has been gradually worsening. The cough is Productive of sputum and productive of purulent sputum (vomiting inducing once). Associated symptoms include ear congestion, ear pain, a fever (occasional in the beginning), headaches, nasal congestion, postnasal drip, rhinorrhea, a sore throat, shortness of breath and wheezing. Associated symptoms comments: Started as a sinus infection. The symptoms are aggravated by lying down and cold air. Treatments tried: Mucinex  night and day, Nyquil, sudafed, nasal sprays (different types), tylenol , and Ibuprofen . The treatment provided no relief. Her past medical history is significant for bronchitis and environmental allergies. There is no history of asthma.     Problems:  Patient Active Problem List   Diagnosis Date Noted   Pap smear for cervical cancer screening 10/04/2023   Cervical polyp 10/04/2023   Allergic rhinitis 07/28/2023   PTSD (post-traumatic stress disorder) 05/13/2020   Cervical radiculopathy 11/07/2019   Poor sleep 02/14/2019   Bipolar 2 disorder (HCC) 03/25/2016   Vertigo 07/29/2014   Abnormal uterine bleeding (AUB) 11/26/2013  Essential hypertension, benign 09/27/2013   GERD (gastroesophageal reflux disease) 05/02/2013   Chronic lumbar pain 10/02/2012   Migraine without aura 07/05/2012   Anxiety state 08/18/2010   OBESITY NOS 01/26/2007    Allergies:  Allergies  Allergen Reactions   Aripiprazole      REACTION: Disoriented.   Escitalopram Oxalate     REACTION: disoriented.   Latex Itching   Shellfish Allergy     Throat itching   Medications:  Current Outpatient Medications:    albuterol (VENTOLIN HFA) 108 (90 Base) MCG/ACT inhaler, Inhale 1-2 puffs into the lungs every 6 (six) hours as needed., Disp: 8 g, Rfl: 0   amoxicillin -clavulanate (AUGMENTIN ) 875-125 MG tablet, Take 1 tablet by mouth 2 (two) times daily., Disp: 20 tablet, Rfl: 0   predniSONE  (DELTASONE ) 20 MG tablet, Take 2 tablets (40 mg total) by mouth daily with breakfast., Disp: 10 tablet, Rfl: 0   promethazine -dextromethorphan (PROMETHAZINE -DM) 6.25-15 MG/5ML syrup, Take 5 mLs by mouth 4 (four) times daily as needed., Disp: 118 mL, Rfl: 0   Azelastine -Fluticasone  137-50 MCG/ACT SUSP, Place 1 spray into the nose in the morning and at bedtime., Disp: 23 g, Rfl: 2   cetirizine  (ZYRTEC ) 10 MG tablet, TAKE 1 TABLET BY MOUTH EVERY DAY, Disp: 30 tablet, Rfl: 11   EPINEPHrine  0.3 mg/0.3 mL IJ SOAJ injection, Inject 0.3 mg into the muscle as needed for anaphylaxis. (Patient not taking: Reported on 07/28/2023), Disp: 1 each, Rfl: 1   ferrous sulfate  (FERROUSUL) 325 (65 FE) MG tablet, Take 1 tablet (325 mg total) by mouth every other day., Disp: 45 tablet, Rfl: 3   montelukast  (SINGULAIR ) 10 MG tablet, TAKE 1 TABLET BY MOUTH EVERYDAY AT BEDTIME, Disp: 90 tablet, Rfl: 1   olmesartan -hydrochlorothiazide  (BENICAR  HCT) 40-12.5 MG tablet, Take 1 tablet by mouth daily., Disp: 90 tablet, Rfl: 3   rosuvastatin  (CRESTOR ) 10 MG tablet, Take 1 tablet (10 mg total) by mouth daily., Disp: 90 tablet, Rfl: 3   SUMAtriptan  (IMITREX ) 25 MG tablet, Take one dose as needed for migraine headache. May repeat in 2 hours if headache persists or recurs. Don't take more than 100 mg per day. Don't use more than 10 days per month., Disp: 12 tablet, Rfl: 2   Observations/Objective: Patient is well-developed, well-nourished in no acute distress.  Resting comfortably  at home.  Head is normocephalic, atraumatic.  No labored breathing.  Speech is clear and coherent with logical content.  Patient is alert and oriented at baseline.    Assessment and Plan: 1. Sinobronchitis (Primary) - amoxicillin -clavulanate (AUGMENTIN ) 875-125 MG tablet; Take 1 tablet by mouth 2 (two) times daily.  Dispense: 20 tablet; Refill: 0 - predniSONE  (DELTASONE ) 20 MG tablet; Take 2 tablets (40 mg total) by mouth daily with breakfast.  Dispense: 10 tablet; Refill: 0 - promethazine -dextromethorphan (PROMETHAZINE -DM) 6.25-15 MG/5ML syrup; Take 5 mLs by mouth 4 (four) times daily as needed.  Dispense: 118 mL; Refill: 0 - albuterol (VENTOLIN HFA) 108 (90 Base) MCG/ACT inhaler; Inhale 1-2 puffs into the lungs every 6 (six) hours as needed.  Dispense: 8 g; Refill: 0  - Worsening over a week despite OTC medications - Will treat with Augmentin  - Added Prednisone  and Albuterol for wheezing - Added Promethazine  DM for cough, drowsiness precautions discussed - Can continue Mucinex   - Push fluids.  - Rest.  - Steam and humidifier can help - Seek in person evaluation if worsening or symptoms fail to improve    Follow Up Instructions: I discussed the assessment and treatment  plan with the patient. The patient was provided an opportunity to ask questions and all were answered. The patient agreed with the plan and demonstrated an understanding of the instructions.  A copy of instructions were sent to the patient via MyChart unless otherwise noted below.    The patient was advised to call back or seek an in-person evaluation if the symptoms worsen or if the condition fails to improve as anticipated.    Delon CHRISTELLA Dickinson, PA-C

## 2024-07-01 NOTE — Patient Instructions (Signed)
 [name], thank you for joining Delon CHRISTELLA Dickinson, PA-C for today's virtual visit.  While this provider is not your primary care provider (PCP), if your PCP is located in our provider database this encounter information will be shared with them immediately following your visit.   A [facility] MyChart account gives you access to today's visit and all your visits, tests, and labs performed at [facility]  click here if you don't have a [facility] MyChart account or go to [email or web address]  Consent: (Patient) [name] provided verbal consent for this virtual visit at the beginning of the encounter.  Current Medications:  Current Outpatient Medications:    albuterol (VENTOLIN HFA) 108 (90 Base) MCG/ACT inhaler, Inhale 1-2 puffs into the lungs every 6 (six) hours as needed., Disp: 8 g, Rfl: 0   amoxicillin -clavulanate (AUGMENTIN ) 875-125 MG tablet, Take 1 tablet by mouth 2 (two) times daily., Disp: 20 tablet, Rfl: 0   predniSONE  (DELTASONE ) 20 MG tablet, Take 2 tablets (40 mg total) by mouth daily with breakfast., Disp: 10 tablet, Rfl: 0   promethazine -dextromethorphan (PROMETHAZINE -DM) 6.25-15 MG/5ML syrup, Take 5 mLs by mouth 4 (four) times daily as needed., Disp: 118 mL, Rfl: 0   Azelastine -Fluticasone  137-50 MCG/ACT SUSP, Place 1 spray into the nose in the morning and at bedtime., Disp: 23 g, Rfl: 2   cetirizine  (ZYRTEC ) 10 MG tablet, TAKE 1 TABLET BY MOUTH EVERY DAY, Disp: 30 tablet, Rfl: 11   EPINEPHrine  0.3 mg/0.3 mL IJ SOAJ injection, Inject 0.3 mg into the muscle as needed for anaphylaxis. (Patient not taking: Reported on 07/28/2023), Disp: 1 each, Rfl: 1   ferrous sulfate  (FERROUSUL) 325 (65 FE) MG tablet, Take 1 tablet (325 mg total) by mouth every other day., Disp: 45 tablet, Rfl: 3   montelukast  (SINGULAIR ) 10 MG tablet, TAKE 1 TABLET BY MOUTH EVERYDAY AT BEDTIME, Disp: 90 tablet, Rfl: 1   olmesartan -hydrochlorothiazide  (BENICAR  HCT) 40-12.5 MG  tablet, Take 1 tablet by mouth daily., Disp: 90 tablet, Rfl: 3   rosuvastatin  (CRESTOR ) 10 MG tablet, Take 1 tablet (10 mg total) by mouth daily., Disp: 90 tablet, Rfl: 3   SUMAtriptan  (IMITREX ) 25 MG tablet, Take one dose as needed for migraine headache. May repeat in 2 hours if headache persists or recurs. Don't take more than 100 mg per day. Don't use more than 10 days per month., Disp: 12 tablet, Rfl: 2   Medications ordered in this encounter:  Meds ordered this encounter  Medications   amoxicillin -clavulanate (AUGMENTIN ) 875-125 MG tablet    Sig: Take 1 tablet by mouth 2 (two) times daily.    Dispense:  20 tablet    Refill:  0    Supervising Provider:   LAMPTEY, PHILIP O [[id number]]   predniSONE  (DELTASONE ) 20 MG tablet    Sig: Take 2 tablets (40 mg total) by mouth daily with breakfast.    Dispense:  10 tablet    Refill:  0    Supervising Provider:   LAMPTEY, PHILIP O [[id number]]   promethazine -dextromethorphan (PROMETHAZINE -DM) 6.25-15 MG/5ML syrup    Sig: Take 5 mLs by mouth 4 (four) times daily as needed.    Dispense:  118 mL    Refill:  0    Supervising Provider:   BLAISE ALEENE KIDD [[id number]]   albuterol (VENTOLIN HFA) 108 (90 Base) MCG/ACT inhaler    Sig: Inhale 1-2 puffs into the lungs every 6 (six) hours as needed.    Dispense:  8 g    Refill:  0    Formulary substitution allowed    Supervising Provider:   BLAISE ALEENE KIDD [[id number]]     *If you need refills on other medications prior to your next appointment, please contact your pharmacy*  Follow-Up: Call back or seek an in-person evaluation if the symptoms worsen or if the condition fails to improve as anticipated.  [facility] [phone]  Other Instructions  Acute Bronchitis, Adult  Acute bronchitis is sudden inflammation of the main airways (bronchi) that come off the windpipe (trachea) in the lungs. The swelling causes the airways to get smaller and make more mucus than normal. This can make it  hard to breathe and can cause coughing or noisy breathing (wheezing). Acute bronchitis may last several weeks. The cough may last longer. Allergies, asthma, and exposure to smoke may make the condition worse. What are the causes? This condition can be caused by germs and by substances that irritate the lungs, including: Cold and flu viruses. The most common cause of this condition is the virus that causes the common cold. Bacteria. This is less common. Breathing in substances that irritate the lungs, including: Smoke from cigarettes and other forms of tobacco. Dust and pollen. Fumes from household cleaning products, gases, or burned fuel. Indoor or outdoor air pollution. What increases the risk? The following factors may make you more likely to develop this condition: A weak body's defense system, also called the immune system. A condition that affects your lungs and breathing, such as asthma. What are the signs or symptoms? Common symptoms of this condition include: Coughing. This may bring up clear, yellow, or green mucus from your lungs (sputum). Wheezing. Runny or stuffy nose. Having too much mucus in your lungs (chest congestion). Shortness of breath. Aches and pains, including sore throat or chest. How is this diagnosed? This condition is usually diagnosed based on: Your symptoms and medical history. A physical exam. You may also have other tests, including tests to rule out other conditions, such as pneumonia. These tests include: A test of lung function. Test of a mucus sample to look for the presence of bacteria. Tests to check the oxygen level in your blood. Blood tests. Chest X-ray. How is this treated? Most cases of acute bronchitis clear up over time without treatment. Your health care provider may recommend: Drinking more fluids to help thin your mucus so it is easier to cough up. Taking inhaled medicine (inhaler) to improve air flow in and out of your lungs. Using  a vaporizer or a humidifier. These are machines that add water to the air to help you breathe better. Taking a medicine that thins mucus and clears congestion (expectorant). Taking a medicine that prevents or stops coughing (cough suppressant). It is not common to take an antibiotic medicine for this condition. Follow these instructions at home:  Take over-the-counter and prescription medicines only as told by your health care provider. Use an inhaler, vaporizer, or humidifier as told by your health care provider. Take two teaspoons (10 mL) of honey at bedtime to lessen coughing at night. Drink enough fluid to keep your urine pale yellow. Do not use any products that contain nicotine  or tobacco. These products include cigarettes, chewing tobacco, and vaping devices, such as e-cigarettes. If you need help quitting, ask your health care provider. Get plenty of rest. Return to your normal activities as told by your health care provider. Ask your health care provider what activities are safe for you. Keep all follow-up visits. This  is important. How is this prevented? To lower your risk of getting this condition again: Wash your hands often with soap and water for at least 20 seconds. If soap and water are not available, use hand sanitizer. Avoid contact with people who have cold symptoms. Try not to touch your mouth, nose, or eyes with your hands. Avoid breathing in smoke or chemical fumes. Breathing smoke or chemical fumes will make your condition worse. Get the flu shot every year. Contact a health care provider if: Your symptoms do not improve after 2 weeks. You have trouble coughing up the mucus. Your cough keeps you awake at night. You have a fever. Get help right away if you: Cough up blood. Feel pain in your chest. Have severe shortness of breath. Faint or keep feeling like you are going to faint. Have a severe headache. Have a fever or chills that get worse. These symptoms may  represent a serious problem that is an emergency. Do not wait to see if the symptoms will go away. Get medical help right away. Call your local emergency services (911 in the [place] Do not drive yourself to the hospital. Summary Acute bronchitis is inflammation of the main airways (bronchi) that come off the windpipe (trachea) in the lungs. The swelling causes the airways to get smaller and make more mucus than normal. Drinking more fluids can help thin your mucus so it is easier to cough up. Take over-the-counter and prescription medicines only as told by your health care provider. Do not use any products that contain nicotine  or tobacco. These products include cigarettes, chewing tobacco, and vaping devices, such as e-cigarettes. If you need help quitting, ask your health care provider. Contact a health care provider if your symptoms do not improve after 2 weeks. This information is not intended to replace advice given to you by your health care provider. Make sure you discuss any questions you have with your health care provider. Document Revised: 11/18/2021 Document Reviewed: 12/09/2020 Elsevier Patient Education  2024 Else[employer] Inc.   Sinus Infection, Adult A sinus infection, also called sinusitis, is inflammation of your sinuses. Sinuses are hollow spaces in the bones around your face. Your sinuses are located: Around your eyes. In the middle of your forehead. Behind your nose. In your cheekbones. Mucus normally drains out of your sinuses. When your nasal tissues become inflamed or swollen, mucus can become trapped or blocked. This allows bacteria, viruses, and fungi to grow, which leads to infection. Most infections of the sinuses are caused by a virus. A sinus infection can develop quickly. It can last for up to 4 weeks (acute) or for more than 12 weeks (chronic). A sinus infection often develops after a cold. What are the causes? This condition is caused by anything that creates  swelling in the sinuses or stops mucus from draining. This includes: Allergies. Asthma. Infection from bacteria or viruses. Deformities or blockages in your nose or sinuses. Abnormal growths in the nose (nasal polyps). Pollutants, such as chemicals or irritants in the air. Infection from fungi. This is rare. What increases the risk? You are more likely to develop this condition if you: Have a weak body defense system (immune system). Do a lot of swimming or diving. Overuse nasal sprays. Smoke. What are the signs or symptoms? The main symptoms of this condition are pain and a feeling of pressure around the affected sinuses. Other symptoms include: Stuffy nose or congestion that makes it difficult to breathe through your nose. Thick yellow  or greenish drainage from your nose. Tenderness, swelling, and warmth over the affected sinuses. A cough that may get worse at night. Decreased sense of smell and taste. Extra mucus that collects in the throat or the back of the nose (postnasal drip) causing a sore throat or bad breath. Tiredness (fatigue). Fever. How is this diagnosed? This condition is diagnosed based on: Your symptoms. Your medical history. A physical exam. Tests to find out if your condition is acute or chronic. This may include: Checking your nose for nasal polyps. Viewing your sinuses using a device that has a light (endoscope). Testing for allergies or bacteria. Imaging tests, such as an MRI or CT scan. In rare cases, a bone biopsy may be done to rule out more serious types of fungal sinus disease. How is this treated? Treatment for a sinus infection depends on the cause and whether your condition is chronic or acute. If caused by a virus, your symptoms should go away on their own within 10 days. You may be given medicines to relieve symptoms. They include: Medicines that shrink swollen nasal passages (decongestants). A spray that eases inflammation of the nostrils  (topical intranasal corticosteroids). Rinses that help get rid of thick mucus in your nose (nasal saline washes). Medicines that treat allergies (antihistamines). Over-the-counter pain relievers. If caused by bacteria, your health care provider may recommend waiting to see if your symptoms improve. Most bacterial infections will get better without antibiotic medicine. You may be given antibiotics if you have: A severe infection. A weak immune system. If caused by narrow nasal passages or nasal polyps, surgery may be needed. Follow these instructions at home: Medicines Take, use, or apply over-the-counter and prescription medicines only as told by your health care provider. These may include nasal sprays. If you were prescribed an antibiotic medicine, take it as told by your health care provider. Do not stop taking the antibiotic even if you start to feel better. Hydrate and humidify  Drink enough fluid to keep your urine pale yellow. Staying hydrated will help to thin your mucus. Use a cool mist humidifier to keep the humidity level in your home above 50%. Inhale steam for 10-15 minutes, 3-4 times a day, or as told by your health care provider. You can do this in the bathroom while a hot shower is running. Limit your exposure to cool or dry air. Rest Rest as much as possible. Sleep with your head raised (elevated). Make sure you get enough sleep each night. General instructions  Apply a warm, moist washcloth to your face 3-4 times a day or as told by your health care provider. This will help with discomfort. Use nasal saline washes as often as told by your health care provider. Wash your hands often with soap and water to reduce your exposure to germs. If soap and water are not available, use hand sanitizer. Do not smoke. Avoid being around people who are smoking (secondhand smoke). Keep all follow-up visits. This is important. Contact a health care provider if: You have a fever. Your  symptoms get worse. Your symptoms do not improve within 10 days. Get help right away if: You have a severe headache. You have persistent vomiting. You have severe pain or swelling around your face or eyes. You have vision problems. You develop confusion. Your neck is stiff. You have trouble breathing. These symptoms may be an emergency. Get help right away. Call 911. Do not wait to see if the symptoms will go away. Do not drive  yourself to the hospital. Summary A sinus infection is soreness and inflammation of your sinuses. Sinuses are hollow spaces in the bones around your face. This condition is caused by nasal tissues that become inflamed or swollen. The swelling traps or blocks the flow of mucus. This allows bacteria, viruses, and fungi to grow, which leads to infection. If you were prescribed an antibiotic medicine, take it as told by your health care provider. Do not stop taking the antibiotic even if you start to feel better. Keep all follow-up visits. This is important. This information is not intended to replace advice given to you by your health care provider. Make sure you discuss any questions you have with your health care provider. Document Revised: 07/13/2021 Document Reviewed: 07/13/2021 Elsevier Patient Education  2024 Elsevier Inc.   If you have been instructed to have an in-person evaluation today at a local Urgent Care facility, please use the link below. It will take you to a list of all of our available [facility], including address, phone number and hours of operation. Please do not delay care.  [facility]  If you or a family member do not have a primary care provider, use the link below to schedule a visit and establish care. When you choose a [facility] primary care physician or advanced practice provider, you gain a long-term partner in health. Find a Primary Care Provider  Learn more about [facility]'s in-office and virtual care  options: [facility] - Get Care Now
